# Patient Record
Sex: Male | Born: 1970
Health system: Southern US, Community
[De-identification: ages and names within clinical notes are randomized; demographics above are authoritative.]

## PROBLEM LIST (undated history)

## (undated) DIAGNOSIS — K5790 Diverticulosis of intestine, part unspecified, without perforation or abscess without bleeding: Secondary | ICD-10-CM

## (undated) DIAGNOSIS — I2510 Atherosclerotic heart disease of native coronary artery without angina pectoris: Secondary | ICD-10-CM

## (undated) DIAGNOSIS — F419 Anxiety disorder, unspecified: Secondary | ICD-10-CM

## (undated) DIAGNOSIS — M545 Low back pain, unspecified: Secondary | ICD-10-CM

## (undated) DIAGNOSIS — E785 Hyperlipidemia, unspecified: Secondary | ICD-10-CM

## (undated) DIAGNOSIS — R0789 Other chest pain: Secondary | ICD-10-CM

## (undated) DIAGNOSIS — K602 Anal fissure, unspecified: Secondary | ICD-10-CM

## (undated) DIAGNOSIS — T7840XA Allergy, unspecified, initial encounter: Secondary | ICD-10-CM

## (undated) DIAGNOSIS — T8859XA Other complications of anesthesia, initial encounter: Secondary | ICD-10-CM

## (undated) DIAGNOSIS — G5600 Carpal tunnel syndrome, unspecified upper limb: Secondary | ICD-10-CM

## (undated) DIAGNOSIS — J449 Chronic obstructive pulmonary disease, unspecified: Secondary | ICD-10-CM

## (undated) DIAGNOSIS — E559 Vitamin D deficiency, unspecified: Secondary | ICD-10-CM

## (undated) DIAGNOSIS — K449 Diaphragmatic hernia without obstruction or gangrene: Secondary | ICD-10-CM

## (undated) DIAGNOSIS — S0300XA Dislocation of jaw, unspecified side, initial encounter: Secondary | ICD-10-CM

## (undated) DIAGNOSIS — H9191 Unspecified hearing loss, right ear: Secondary | ICD-10-CM

## (undated) DIAGNOSIS — K219 Gastro-esophageal reflux disease without esophagitis: Secondary | ICD-10-CM

## (undated) DIAGNOSIS — E538 Deficiency of other specified B group vitamins: Secondary | ICD-10-CM

## (undated) DIAGNOSIS — A048 Other specified bacterial intestinal infections: Secondary | ICD-10-CM

## (undated) HISTORY — DX: Allergy, unspecified, initial encounter: T78.40XA

## (undated) HISTORY — DX: Anxiety disorder, unspecified: F41.9

## (undated) HISTORY — PX: VASECTOMY: SHX75

## (undated) HISTORY — DX: Diverticulosis of intestine, part unspecified, without perforation or abscess without bleeding: K57.90

## (undated) HISTORY — PX: STAPEDECTOMY: SHX2435

## (undated) HISTORY — DX: Low back pain, unspecified: M54.50

## (undated) HISTORY — PX: COLONOSCOPY: SHX174

## (undated) HISTORY — DX: Other specified bacterial intestinal infections: A04.8

## (undated) HISTORY — DX: Diaphragmatic hernia without obstruction or gangrene: K44.9

## (undated) HISTORY — DX: Dislocation of jaw, unspecified side, initial encounter: S03.00XA

## (undated) HISTORY — DX: Anal fissure, unspecified: K60.2

## (undated) HISTORY — DX: Hyperlipidemia, unspecified: E78.5

## (undated) HISTORY — PX: UPPER GI ENDOSCOPY: SHX6162

## (undated) HISTORY — PX: HERNIA REPAIR: SHX51

## (undated) HISTORY — DX: Chronic obstructive pulmonary disease, unspecified: J44.9

## (undated) HISTORY — DX: Low back pain: M54.5

## (undated) HISTORY — DX: Gastro-esophageal reflux disease without esophagitis: K21.9

---

## 2003-02-19 ENCOUNTER — Encounter: Admission: RE | Admit: 2003-02-19 | Discharge: 2003-02-19 | Payer: Self-pay | Admitting: Internal Medicine

## 2003-03-03 ENCOUNTER — Emergency Department (HOSPITAL_COMMUNITY): Admission: EM | Admit: 2003-03-03 | Discharge: 2003-03-03 | Payer: Self-pay | Admitting: Emergency Medicine

## 2004-02-15 ENCOUNTER — Ambulatory Visit: Payer: Self-pay | Admitting: Internal Medicine

## 2004-03-26 ENCOUNTER — Emergency Department (HOSPITAL_COMMUNITY): Admission: EM | Admit: 2004-03-26 | Discharge: 2004-03-26 | Payer: Self-pay | Admitting: Emergency Medicine

## 2004-03-27 ENCOUNTER — Ambulatory Visit: Payer: Self-pay | Admitting: Internal Medicine

## 2004-04-02 ENCOUNTER — Ambulatory Visit: Payer: Self-pay

## 2004-08-01 ENCOUNTER — Ambulatory Visit: Payer: Self-pay | Admitting: Internal Medicine

## 2004-08-25 ENCOUNTER — Ambulatory Visit: Payer: Self-pay | Admitting: Internal Medicine

## 2004-09-19 ENCOUNTER — Ambulatory Visit: Payer: Self-pay | Admitting: Internal Medicine

## 2004-09-29 ENCOUNTER — Ambulatory Visit: Payer: Self-pay | Admitting: Internal Medicine

## 2004-10-08 ENCOUNTER — Ambulatory Visit: Payer: Self-pay | Admitting: Internal Medicine

## 2004-10-10 ENCOUNTER — Encounter: Payer: Self-pay | Admitting: Internal Medicine

## 2004-10-10 ENCOUNTER — Ambulatory Visit: Payer: Self-pay | Admitting: Internal Medicine

## 2004-10-14 ENCOUNTER — Ambulatory Visit: Payer: Self-pay | Admitting: Internal Medicine

## 2004-11-07 ENCOUNTER — Ambulatory Visit: Payer: Self-pay | Admitting: Internal Medicine

## 2005-07-08 ENCOUNTER — Ambulatory Visit: Payer: Self-pay | Admitting: Internal Medicine

## 2006-03-02 ENCOUNTER — Ambulatory Visit: Payer: Self-pay | Admitting: Internal Medicine

## 2006-03-12 ENCOUNTER — Ambulatory Visit: Payer: Self-pay | Admitting: Internal Medicine

## 2006-03-16 ENCOUNTER — Ambulatory Visit: Payer: Self-pay | Admitting: Internal Medicine

## 2006-03-22 ENCOUNTER — Ambulatory Visit: Payer: Self-pay | Admitting: Internal Medicine

## 2006-04-14 ENCOUNTER — Ambulatory Visit: Payer: Self-pay | Admitting: Endocrinology

## 2006-04-14 LAB — CONVERTED CEMR LAB
Alkaline Phosphatase: 54 units/L (ref 39–117)
Amylase: 52 units/L (ref 27–131)
BUN: 7 mg/dL (ref 6–23)
Bacteria, UA: NEGATIVE
Basophils Absolute: 0 10*3/uL (ref 0.0–0.1)
Bilirubin, Direct: 0.2 mg/dL (ref 0.0–0.3)
CO2: 30 meq/L (ref 19–32)
Calcium: 9.3 mg/dL (ref 8.4–10.5)
Cholesterol: 155 mg/dL (ref 0–200)
Creatinine, Ser: 0.7 mg/dL (ref 0.4–1.5)
Eosinophils Absolute: 0.1 10*3/uL (ref 0.0–0.6)
Eosinophils Relative: 3.2 % (ref 0.0–5.0)
GFR calc Af Amer: 165 mL/min
Glucose, Bld: 104 mg/dL — ABNORMAL HIGH (ref 70–99)
HDL: 49.7 mg/dL (ref >39.0)
Ketones, ur: NEGATIVE mg/dL
Leukocytes, UA: NEGATIVE
Lymphocytes Relative: 31.7 % (ref 12.0–46.0)
MCHC: 34.2 g/dL (ref 30.0–36.0)
MCV: 91.5 fL (ref 78.0–100.0)
Monocytes Relative: 9.1 % (ref 3.0–11.0)
Neutro Abs: 2.6 10*3/uL (ref 1.4–7.7)
Neutrophils Relative %: 55 % (ref 43.0–77.0)
Platelets: 257 10*3/uL (ref 150–400)
Potassium: 4.7 meq/L (ref 3.5–5.1)
RBC / HPF: NONE SEEN
TSH: 1.27 microintl units/mL (ref 0.35–5.50)
Total CHOL/HDL Ratio: 3.1
Total Protein: 7 g/dL (ref 6.0–8.3)
Triglycerides: 51 mg/dL (ref 0–149)
Urine Glucose: NEGATIVE mg/dL
Urobilinogen, UA: 0.2 (ref 0.0–1.0)
VLDL: 10 mg/dL (ref 0–40)

## 2006-05-12 ENCOUNTER — Ambulatory Visit: Payer: Self-pay | Admitting: Internal Medicine

## 2006-05-21 ENCOUNTER — Ambulatory Visit: Payer: Self-pay | Admitting: Internal Medicine

## 2006-10-06 DIAGNOSIS — M545 Low back pain, unspecified: Secondary | ICD-10-CM | POA: Insufficient documentation

## 2006-10-06 DIAGNOSIS — K219 Gastro-esophageal reflux disease without esophagitis: Secondary | ICD-10-CM | POA: Insufficient documentation

## 2006-10-06 DIAGNOSIS — J309 Allergic rhinitis, unspecified: Secondary | ICD-10-CM | POA: Insufficient documentation

## 2006-10-06 DIAGNOSIS — J45909 Unspecified asthma, uncomplicated: Secondary | ICD-10-CM | POA: Insufficient documentation

## 2006-10-06 DIAGNOSIS — F411 Generalized anxiety disorder: Secondary | ICD-10-CM | POA: Insufficient documentation

## 2006-10-06 DIAGNOSIS — H9190 Unspecified hearing loss, unspecified ear: Secondary | ICD-10-CM | POA: Insufficient documentation

## 2007-01-08 ENCOUNTER — Encounter: Payer: Self-pay | Admitting: Internal Medicine

## 2007-01-20 ENCOUNTER — Encounter: Payer: Self-pay | Admitting: Internal Medicine

## 2008-03-22 ENCOUNTER — Ambulatory Visit: Payer: Self-pay | Admitting: Internal Medicine

## 2008-03-22 DIAGNOSIS — R1084 Generalized abdominal pain: Secondary | ICD-10-CM | POA: Insufficient documentation

## 2008-03-22 LAB — CONVERTED CEMR LAB
AST: 23 units/L (ref 0–37)
Albumin: 4.5 g/dL (ref 3.5–5.2)
Alkaline Phosphatase: 69 units/L (ref 39–117)
Amylase: 58 units/L (ref 27–131)
BUN: 11 mg/dL (ref 6–23)
Basophils Absolute: 0 10*3/uL (ref 0.0–0.1)
Basophils Relative: 0.7 % (ref 0.0–3.0)
Chloride: 105 meq/L (ref 96–112)
Cholesterol: 185 mg/dL (ref 0–200)
Creatinine, Ser: 0.8 mg/dL (ref 0.4–1.5)
Eosinophils Absolute: 0.1 10*3/uL (ref 0.0–0.7)
Eosinophils Relative: 2.4 % (ref 0.0–5.0)
GFR calc non Af Amer: 116 mL/min
Glucose, Bld: 98 mg/dL (ref 70–99)
HCT: 43.6 % (ref 39.0–52.0)
Hemoglobin: 15.1 g/dL (ref 13.0–17.0)
MCHC: 34.7 g/dL (ref 30.0–36.0)
MCV: 92.9 fL (ref 78.0–100.0)
Monocytes Absolute: 0.5 10*3/uL (ref 0.1–1.0)
Neutrophils Relative %: 49.7 % (ref 43.0–77.0)
Potassium: 3.8 meq/L (ref 3.5–5.1)
RBC: 4.69 M/uL (ref 4.22–5.81)
Specific Gravity, Urine: 1.015 (ref 1.000–1.03)
Total Protein, Urine: NEGATIVE mg/dL
Total Protein: 7.3 g/dL (ref 6.0–8.3)
Urine Glucose: NEGATIVE mg/dL
WBC: 5 10*3/uL (ref 4.5–10.5)
pH: 7 (ref 5.0–8.0)

## 2008-03-26 ENCOUNTER — Encounter: Admission: RE | Admit: 2008-03-26 | Discharge: 2008-03-26 | Payer: Self-pay | Admitting: Internal Medicine

## 2008-06-19 ENCOUNTER — Encounter: Payer: Self-pay | Admitting: Internal Medicine

## 2008-09-16 ENCOUNTER — Encounter: Payer: Self-pay | Admitting: Internal Medicine

## 2008-09-27 IMAGING — US US ABDOMEN COMPLETE
1 series · 14 of 25 positions shown · non-contrast
Comparison: none

ACCESSION NUMBER:  [id number]

 INDICATIONS:  Abdominal pain.
 READING PHYSICIAN:  Irania Buckner, M.D. 
 PROCEDURE:  Multiplanar abdominal ultrasound imaging was performed in the upright, supine, right and left lateral decubitus positions.
 RESULTS
 Abdominal aorta 22 mm.  The inferior vena cava is patent. 
 The pancreas appears normal throughout the head, body and tail without evidence of ductal dilatation, pancreatic masses, or peripancreatic inflammation.  
 Gallbladder is well distended, with normal wall thickness of 1.6 mm, with no pericholecystic fluid or intraluminal echogenic foci to suggest gallstone disease.  
 The common bile duct measures 7.1 mm in maximal diameter, upper limits of normal, without evidence of intraluminal foci.
 The liver appears normal without evidence of parenchymal lesion, ductal dilatation or vascular abnormality.
 Right kidney 112 mm, left kidney 108 mm.
 Spleen is normal in size, measuring 97 mm without parenchymal lesion.

[Series 1: us abdomen complete · 0.19mm/px · 14 of 56 slices shown]
[im 1/56]
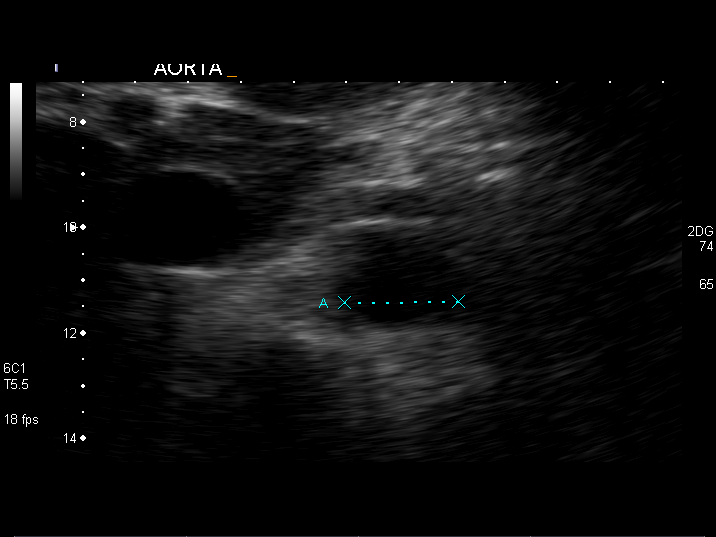
[im 5/56]
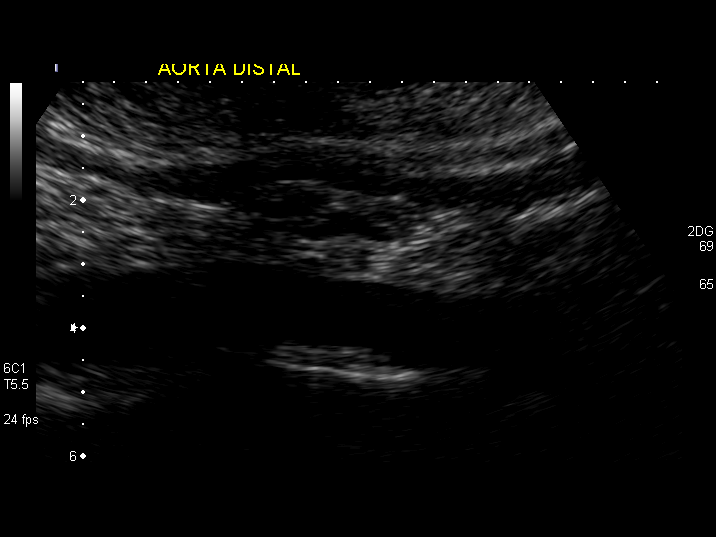
[im 10/56]
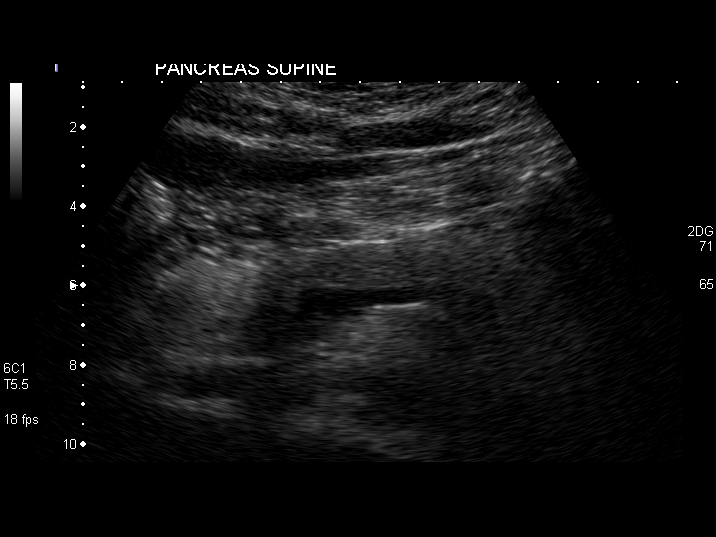
[im 14/56]
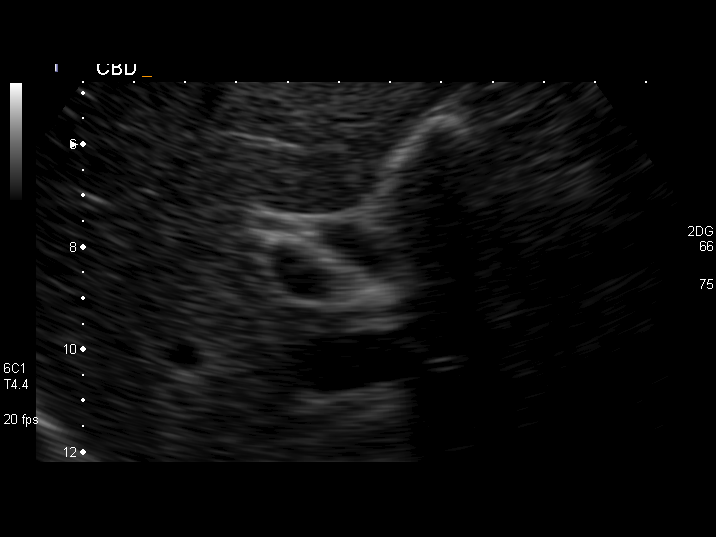
[im 19/56]
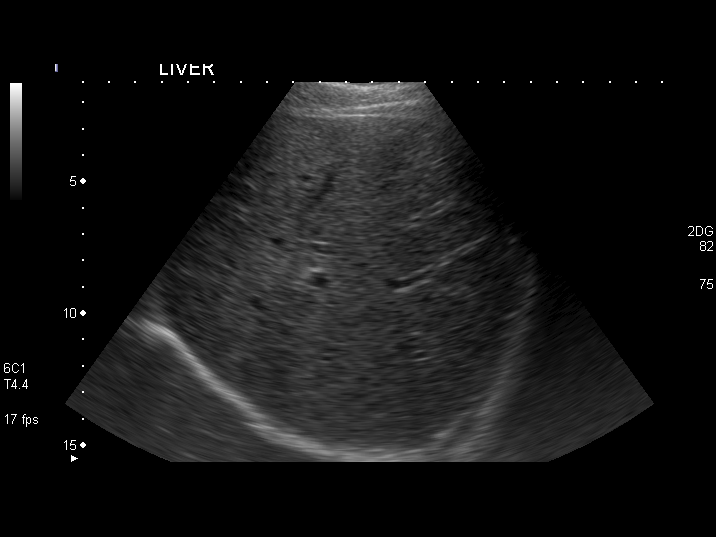
[im 21/56]
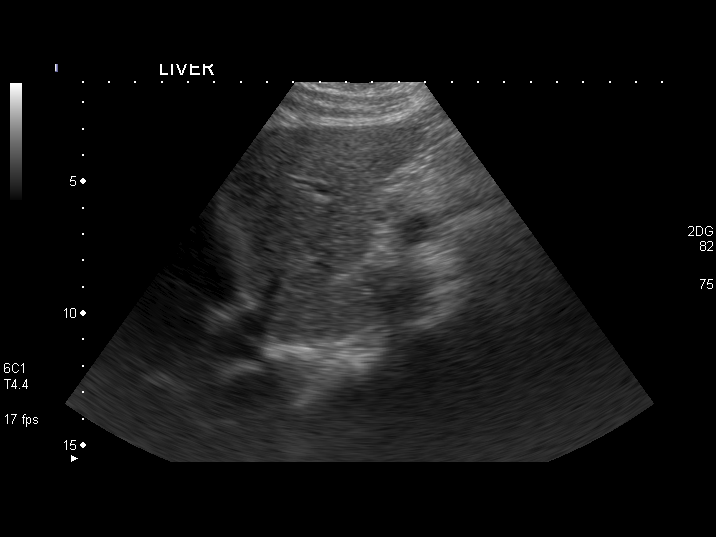
[im 26/56]
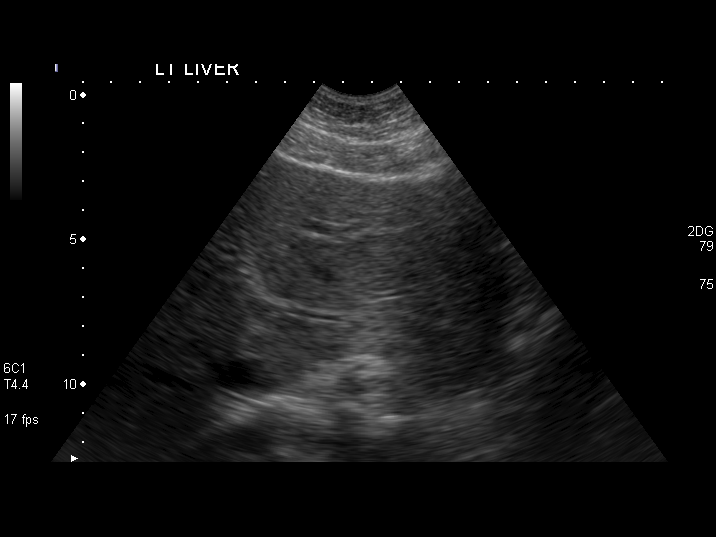
[im 30/56]
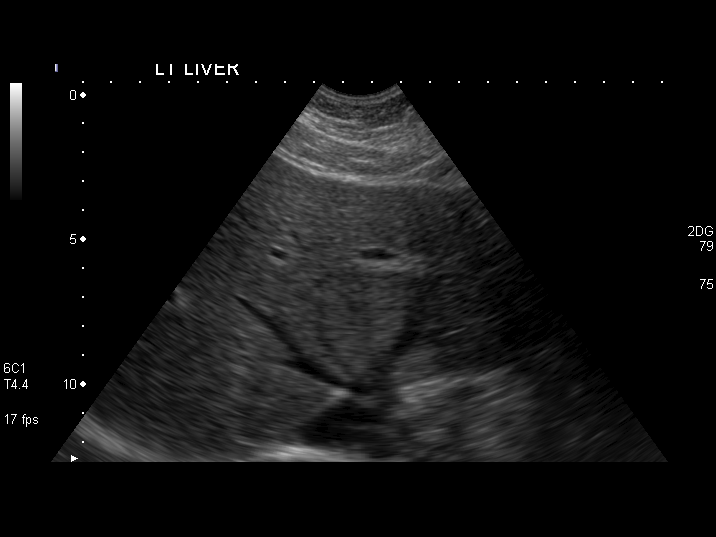
[im 35/56]
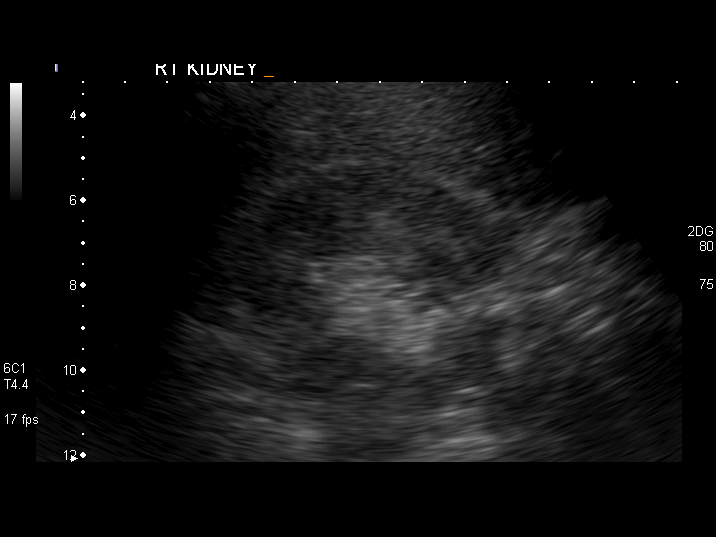
[im 37/56]
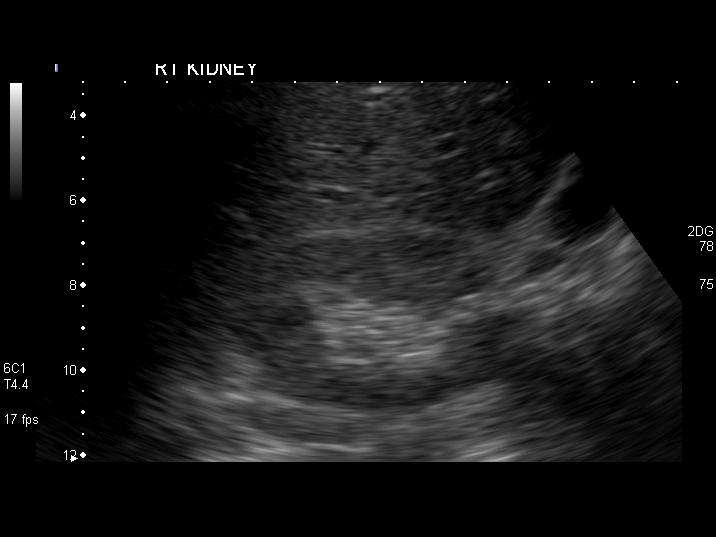
[im 42/56]
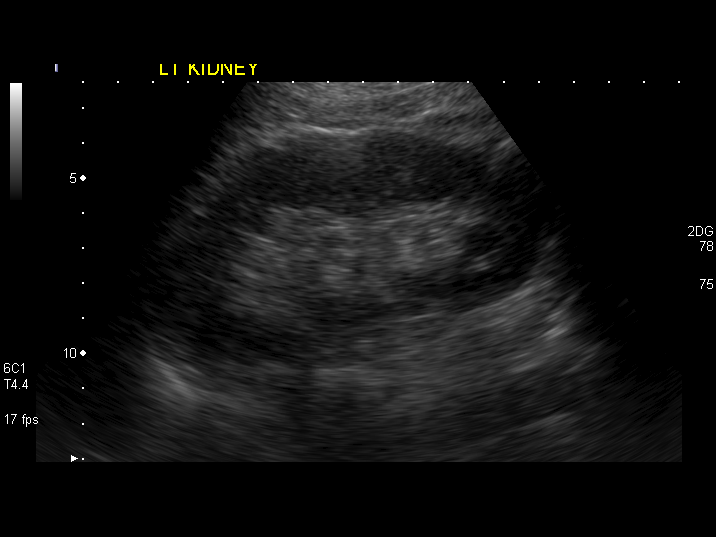
[im 46/56]
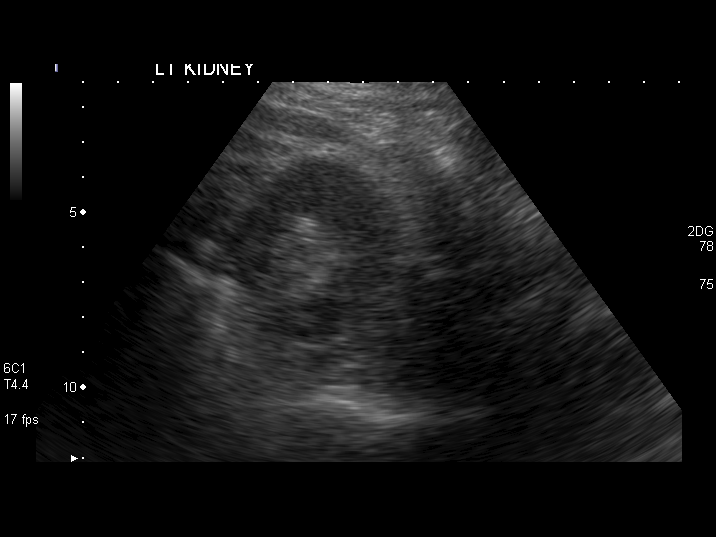
[im 51/56]
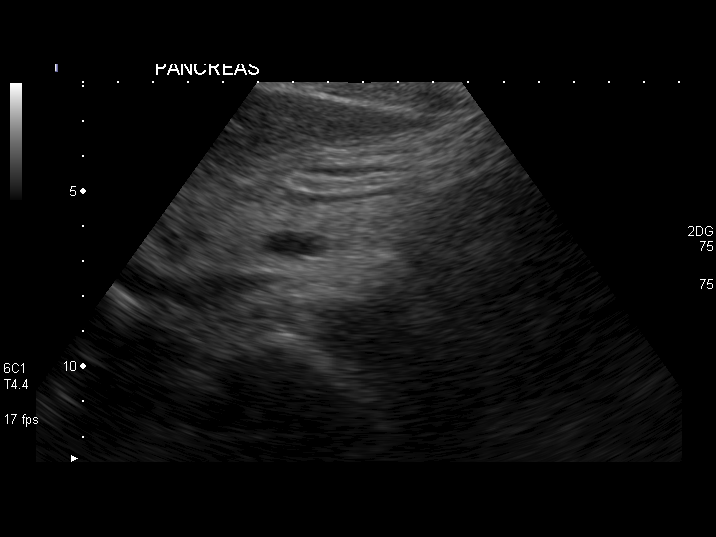
[im 56/56]
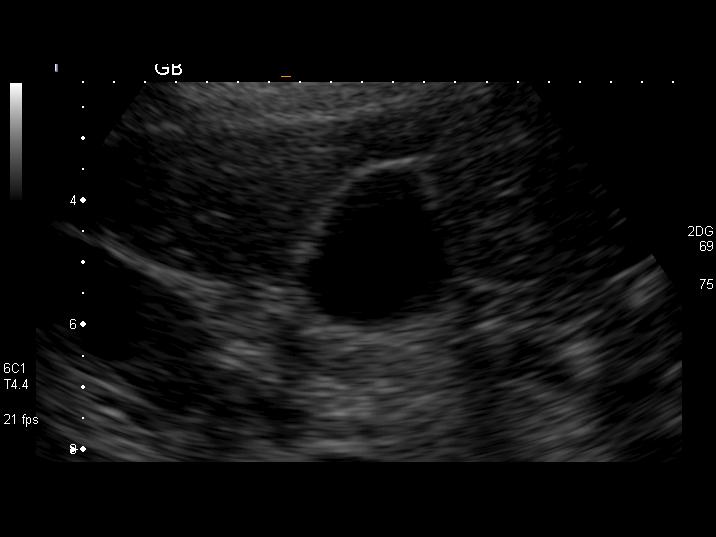

[14 of 25 positions shown; findings below may reference images not displayed]

IMPRESSION: Normal upper abdominal ultrasound of the gallbladder, pancreas, and liver with normal common bile duct at the upper limits of normal of 7.1 mm.

## 2008-12-25 ENCOUNTER — Encounter: Payer: Self-pay | Admitting: Internal Medicine

## 2009-04-18 ENCOUNTER — Ambulatory Visit: Payer: Self-pay | Admitting: Internal Medicine

## 2009-04-18 LAB — CONVERTED CEMR LAB
ALT: 62 units/L — ABNORMAL HIGH (ref 0–53)
AST: 27 units/L (ref 0–37)
Albumin: 4.3 g/dL (ref 3.5–5.2)
Alkaline Phosphatase: 70 units/L (ref 39–117)
BUN: 9 mg/dL (ref 6–23)
Basophils Absolute: 0 10*3/uL (ref 0.0–0.1)
Bilirubin Urine: NEGATIVE
Bilirubin, Direct: 0.2 mg/dL (ref 0.0–0.3)
Calcium: 9.3 mg/dL (ref 8.4–10.5)
Creatinine, Ser: 0.7 mg/dL (ref 0.4–1.5)
Eosinophils Absolute: 0.1 10*3/uL (ref 0.0–0.7)
Eosinophils Relative: 2.6 % (ref 0.0–5.0)
GFR calc non Af Amer: 133.62 mL/min (ref >60)
Glucose, Bld: 97 mg/dL (ref 70–99)
HDL: 63.9 mg/dL (ref >39.00)
Hemoglobin, Urine: NEGATIVE
Hemoglobin: 14 g/dL (ref 13.0–17.0)
Ketones, ur: NEGATIVE mg/dL
LDL Cholesterol: 116 mg/dL — ABNORMAL HIGH (ref 0–99)
Leukocytes, UA: NEGATIVE
Lymphocytes Relative: 35.1 % (ref 12.0–46.0)
Monocytes Relative: 8.7 % (ref 3.0–12.0)
Neutro Abs: 2.7 10*3/uL (ref 1.4–7.7)
Neutrophils Relative %: 52.7 % (ref 43.0–77.0)
Nitrite: NEGATIVE
Platelets: 211 10*3/uL (ref 150.0–400.0)
RDW: 11.6 % (ref 11.5–14.6)
Sodium: 143 meq/L (ref 135–145)
TSH: 1.12 microintl units/mL (ref 0.35–5.50)
Total Bilirubin: 0.9 mg/dL (ref 0.3–1.2)
Total Protein, Urine: NEGATIVE mg/dL
VLDL: 20.4 mg/dL (ref 0.0–40.0)

## 2009-09-25 ENCOUNTER — Encounter: Payer: Self-pay | Admitting: Internal Medicine

## 2009-09-25 ENCOUNTER — Ambulatory Visit: Payer: Self-pay | Admitting: Internal Medicine

## 2009-09-25 DIAGNOSIS — E785 Hyperlipidemia, unspecified: Secondary | ICD-10-CM | POA: Insufficient documentation

## 2009-09-25 DIAGNOSIS — K921 Melena: Secondary | ICD-10-CM | POA: Insufficient documentation

## 2009-09-25 DIAGNOSIS — K602 Anal fissure, unspecified: Secondary | ICD-10-CM | POA: Insufficient documentation

## 2009-09-30 ENCOUNTER — Ambulatory Visit: Payer: Self-pay | Admitting: Internal Medicine

## 2009-09-30 LAB — CONVERTED CEMR LAB
Bilirubin Urine: NEGATIVE
Ketones, ur: NEGATIVE mg/dL
Leukocytes, UA: NEGATIVE
Nitrite: NEGATIVE
Specific Gravity, Urine: 1.015 (ref 1.000–1.030)
Urine Glucose: NEGATIVE mg/dL
pH: 8 (ref 5.0–8.0)

## 2009-10-01 ENCOUNTER — Encounter: Payer: Self-pay | Admitting: Internal Medicine

## 2009-10-15 ENCOUNTER — Telehealth: Payer: Self-pay | Admitting: Internal Medicine

## 2009-10-16 ENCOUNTER — Encounter: Admission: RE | Admit: 2009-10-16 | Discharge: 2009-10-16 | Payer: Self-pay | Admitting: Otolaryngology

## 2009-10-19 ENCOUNTER — Emergency Department (HOSPITAL_COMMUNITY): Admission: EM | Admit: 2009-10-19 | Discharge: 2009-10-19 | Payer: Self-pay | Admitting: Emergency Medicine

## 2009-10-21 ENCOUNTER — Ambulatory Visit: Payer: Self-pay | Admitting: Internal Medicine

## 2009-10-21 DIAGNOSIS — R1013 Epigastric pain: Secondary | ICD-10-CM | POA: Insufficient documentation

## 2009-10-21 LAB — CONVERTED CEMR LAB
Albumin: 4.1 g/dL (ref 3.5–5.2)
Alkaline Phosphatase: 62 units/L (ref 39–117)
Basophils Absolute: 0 10*3/uL (ref 0.0–0.1)
Basophils Relative: 0.7 % (ref 0.0–3.0)
Bilirubin Urine: NEGATIVE
CO2: 30 meq/L (ref 19–32)
Calcium: 9.2 mg/dL (ref 8.4–10.5)
Chloride: 105 meq/L (ref 96–112)
Eosinophils Absolute: 0.1 10*3/uL (ref 0.0–0.7)
GFR calc non Af Amer: 109.48 mL/min (ref >60)
Glucose, Bld: 90 mg/dL (ref 70–99)
HCT: 41.3 % (ref 39.0–52.0)
Hemoglobin, Urine: NEGATIVE
Hemoglobin: 14.6 g/dL (ref 13.0–17.0)
Lipase: 26 units/L (ref 11.0–59.0)
Lymphocytes Relative: 35.6 % (ref 12.0–46.0)
Lymphs Abs: 1.8 10*3/uL (ref 0.7–4.0)
MCHC: 35.5 g/dL (ref 30.0–36.0)
MCV: 92.3 fL (ref 78.0–100.0)
Monocytes Absolute: 0.6 10*3/uL (ref 0.1–1.0)
Neutro Abs: 2.6 10*3/uL (ref 1.4–7.7)
Nitrite: NEGATIVE
RBC: 4.47 M/uL (ref 4.22–5.81)
RDW: 12.8 % (ref 11.5–14.6)
Sed Rate: 10 mm/hr (ref 0–22)
Sodium: 141 meq/L (ref 135–145)
Total Protein, Urine: NEGATIVE mg/dL
Total Protein: 6.6 g/dL (ref 6.0–8.3)
Urobilinogen, UA: 0.2 (ref 0.0–1.0)
pH: 6 (ref 5.0–8.0)

## 2009-10-22 ENCOUNTER — Ambulatory Visit: Payer: Self-pay | Admitting: Cardiovascular Disease

## 2009-10-24 ENCOUNTER — Telehealth: Payer: Self-pay | Admitting: Internal Medicine

## 2009-11-01 ENCOUNTER — Encounter: Payer: Self-pay | Admitting: Internal Medicine

## 2009-11-12 ENCOUNTER — Ambulatory Visit: Payer: Self-pay | Admitting: Internal Medicine

## 2009-11-12 DIAGNOSIS — R933 Abnormal findings on diagnostic imaging of other parts of digestive tract: Secondary | ICD-10-CM | POA: Insufficient documentation

## 2009-11-12 DIAGNOSIS — R198 Other specified symptoms and signs involving the digestive system and abdomen: Secondary | ICD-10-CM | POA: Insufficient documentation

## 2009-11-26 ENCOUNTER — Ambulatory Visit: Payer: Self-pay | Admitting: Internal Medicine

## 2009-11-26 DIAGNOSIS — R059 Cough, unspecified: Secondary | ICD-10-CM | POA: Insufficient documentation

## 2009-11-26 DIAGNOSIS — R05 Cough: Secondary | ICD-10-CM

## 2009-11-26 DIAGNOSIS — J029 Acute pharyngitis, unspecified: Secondary | ICD-10-CM | POA: Insufficient documentation

## 2009-12-06 ENCOUNTER — Encounter: Payer: Self-pay | Admitting: Internal Medicine

## 2009-12-17 ENCOUNTER — Ambulatory Visit: Payer: Self-pay | Admitting: Internal Medicine

## 2010-01-27 ENCOUNTER — Ambulatory Visit: Payer: Self-pay | Admitting: Internal Medicine

## 2010-01-27 DIAGNOSIS — J019 Acute sinusitis, unspecified: Secondary | ICD-10-CM | POA: Insufficient documentation

## 2010-01-29 ENCOUNTER — Encounter: Payer: Self-pay | Admitting: Internal Medicine

## 2010-02-11 ENCOUNTER — Ambulatory Visit
Admission: RE | Admit: 2010-02-11 | Discharge: 2010-02-11 | Payer: Self-pay | Source: Home / Self Care | Attending: Internal Medicine | Admitting: Internal Medicine

## 2010-02-11 DIAGNOSIS — H6990 Unspecified Eustachian tube disorder, unspecified ear: Secondary | ICD-10-CM | POA: Insufficient documentation

## 2010-02-11 DIAGNOSIS — H6690 Otitis media, unspecified, unspecified ear: Secondary | ICD-10-CM | POA: Insufficient documentation

## 2010-02-18 ENCOUNTER — Telehealth: Payer: Self-pay | Admitting: Internal Medicine

## 2010-03-11 NOTE — Procedures (Signed)
Summary: Upper Endoscopy  Patient: Billy Morris Note: All result statuses are Final unless otherwise noted.  Tests: (1) Upper Endoscopy (EGD)   EGD Upper Endoscopy       DONE     [facility]     [address]     [place], [place]  [address]           ENDOSCOPY PROCEDURE REPORT           PATIENT:  Billy Morris, Billy Morris  MR#:  [id number]     BIRTHDATE:  1970/06/16, 39 yrs. old  GENDER:  male           ENDOSCOPIST:  Wilhemina Bonito. Eda Keys, MD     Referred by:  Office           PROCEDURE DATE:  12/17/2009     PROCEDURE:  EGD, diagnostic 19147     ASA CLASS:  Class II     INDICATIONS:  abdominal pain           MEDICATIONS:   There was residual sedation effect present from     prior procedure., Fentanyl 25 mcg IV, Versed 2 mg IV     TOPICAL ANESTHETIC:  Exactacain Spray           DESCRIPTION OF PROCEDURE:   After the risks benefits and     alternatives of the procedure were thoroughly explained, informed     consent was obtained.  The LB GIF-H180 [id number] endoscope was     introduced through the mouth and advanced to the second portion of     the duodenum, without limitations.  The instrument was slowly     withdrawn as the mucosa was fully examined.     <<PROCEDUREIMAGES>>           The upper, middle, and distal third of the esophagus were     carefully inspected and no abnormalities were noted. The z-line     was well seen at the GEJ. The endoscope was pushed into the fundus     which was normal including a retroflexed view. The antrum,gastric     body, first and second part of the duodenum were unremarkable.     Retroflexed views revealed no abnormalities.    The scope was then     withdrawn from the patient and the procedure completed.           COMPLICATIONS:  None           ENDOSCOPIC IMPRESSION:     1) Normal EGD     2) Nonulcer dyspepsia     RECOMMENDATIONS:     1) follow-up prn (feeling better now)           ______________________________     Wilhemina Bonito. Eda Keys, MD          CC:  Corwin Levins, MD, The Patient           n.     eSIGNEDWilhemina Bonito. Eda Keys at 12/17/2009 03:02 PM           Vernell Leep, [id number]  Note: An exclamation mark (!) indicates a result that was not dispersed into the flowsheet. Document Creation Date: 12/17/2009 3:02 PM _______________________________________________________________________  (1) Order result status: Final Collection or observation date-time: 12/17/2009 14:55 Requested date-time:  Receipt date-time:  Reported date-time:  Referring Physician:   Ordering Physician: Fransico Setters [phone]) Specimen Source:  Source: Launa Grill Order Number: [id number] Lab site:

## 2010-03-11 NOTE — Assessment & Plan Note (Signed)
Summary: STOMACH PROBLEMS/ LOWER ABD /NWS   Vital Signs:  Patient profile:   40 year old male Height:      66 inches Weight:      184.25 pounds BMI:     29.85 O2 Sat:      98 % on Room air Temp:     98.2 degrees F oral Pulse rate:   69 / minute BP sitting:   118 / 74  (left arm) Cuff size:   regular  Vitals Entered By: Zella Ball Ewing CMA (AAMA) (September 30, 2009 1:50 PM)  O2 Flow:  Room air CC: Stomach problems/RE   CC:  Stomach problems/RE.  History of Present Illness: here to f/u - had routine regular diet, but over the weekend in [place] felt more constipated, similar to one mo agoa swell;  felt some "rumbling and moving" in the upper mid abd area;  BM's over the weekend quite small but not very hard - just small amounts.  Last normal BM per pt aobut 2 am today, but "just didnt feel right."  No blood except for small volume iwth wiping the anal irritaiton area, no n/v or other significnat worse pain (has mild ongoin irriation) .  Has been uring the anusol [facility] but just not today.  No No fever, wt loss, night sweats, loss of appetite or other constitutional symptoms  Has appt with Dr Marina Goodell oct 4.   Also menitoned has seen chiropracter for recurrent pain to lower back, and told he has muscle spasm;  but he remembers a "black spot" on the xray that really concerned him and still does to this day.  Pt denies CP, worsening sob, doe, wheezing, orthopnea, pnd, worsening LE edema, palps, dizziness or syncope  Pt denies new neuro symptoms such as headache, facial or extremity weakness  Ongoing anxiety persists but no pani, or worsening depressive symtpoms.  Has full time job, son got injuryed over the weekend (not serious) , and several other small projects that do pay money, as well as one for [employer].  Anxiety really started iwth the anal tear approx 6 wks , also assoc with change in lifestyle wiht work - was working 5 days second shift;  now working full time 36 hrs on the weekends nights only.     Preventive Screening-Counseling & Management      Drug Use:  no.    Problems Prior to Update: 1)  Anal Fissure  (ICD-565.0) 2)  Hematochezia  (ICD-578.1) 3)  Hyperlipidemia  (ICD-272.4) 4)  Abdominal Pain, Generalized  (ICD-789.07) 5)  Preventive Health Care  (ICD-V70.0) 6)  Low Back Pain  (ICD-724.2) 7)  Gerd  (ICD-530.81) 8)  Anxiety  (ICD-300.00) 9)  Loss, Hearing Nos  (ICD-389.9) 10)  Asthma  (ICD-493.90) 11)  Allergic Rhinitis  (ICD-477.9)  Medications Prior to Update: 1)  Anusol-Hc 25 Mg Supp (Hydrocortisone Acetate) .Marland Kitchen.. 1 Pr Two Times A Day For 7 Days  Current Medications (verified): 1)  Anusol-Hc 25 Mg Supp (Hydrocortisone Acetate) .Marland Kitchen.. 1 Pr Two Times A Day For 7 Days 2)  Proctofoam Hc 1-1 % Foam (Hydrocortisone Ace-Pramoxine) .... Use Asd Bid As Needed 3)  Prednisone 10 Mg Tabs (Prednisone) .... 3po Qd For 3days, Then 2po Qd For 3days, Then 1po Qd For 3days, Then Stop  Allergies (verified): 1)  ! Asa 2)  ! Cipro 3)  ! 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   Head:  normocephalic and atraumatic.   Eyes:  vision grossly intact, pupils equal, and pupils round.   Ears:  R ear normal and L ear normal.   Nose:  no external deformity and no nasal discharge.    Mouth:  no gingival abnormalities and pharynx pink and moist.   Neck:  supple and no masses.   Lungs:  normal respiratory effort and normal breath sounds.   Heart:  normal rate and regular rhythm.   Abdomen:  soft, non-tender, and normal bowel sounds.  except or mild LLQ tender at best,  no guarding or rebound Extremities:  no edema, no erythema    Impression & Recommendations:  Problem # 1:  ABDOMINAL PAIN, GENERALIZED (ICD-789.07)  primarily upper; as well as LLQ with tender there today;  diff includes UTI, constipation/IBS? , or IBD ;  for UA today,, and trial mag citrate - 1 bottle OTC;  if not improved can try limited low dose prednisone course as empriic trial;  very much doubt infectious etiology such as diverticulitis  Orders: T-Culture, Urine (04540-98119) TLB-Udip w/ Micro (81001-URINE)  Problem # 2:  HEMATOCHEZIA (ICD-578.1) ok also for topical proctofoam HC  Problem # 3:  ANXIETY (ICD-300.00) stable overall by hx and exam, ok to continue meds/tx as is  - declines meds at this time  Problem # 4:  GERD (ICD-530.81) asympt per pt - no meds needed at this time  Complete Medication List: 1)  Anusol-hc 25 Mg Supp (Hydrocortisone acetate) .Marland Kitchen.. 1 pr two times a day for 7 days 2)  Proctofoam Hc 1-1 % Foam (Hydrocortisone ace-pramoxine) .... Use asd bid as needed 3)  Prednisone 10 Mg Tabs (Prednisone) .... 3po qd for 3days, then 2po qd for 3days, then 1po qd for 3days, then stop  Patient Instructions: 1)  please try 1 bottle Mag Citrate OTC today 2)  Please take all new medications as prescribed - the topical foam for the anal region, as well as the prednisone if the mag citrate does not seem to help 3)  Please go to the Lab in the basement for your blood and/or urine tests today  4)  Please keep your appt with Dr Marina Goodell as planned 5)  Please schedule a follow-up appointment as March 2012 with CPX labs, or sooner if needed Prescriptions: PREDNISONE 10 MG TABS (PREDNISONE)  3po qd for 3days, then 2po qd for 3days, then 1po qd for 3days, then stop  #18 x 0   Entered and Authorized by:   Corwin Levins MD   Signed by:   Corwin Levins MD on 09/30/2009   Method used:   Print then Give to Patient   RxID:   [id number] PROCTOFOAM HC 1-1 % FOAM (HYDROCORTISONE ACE-PRAMOXINE) use asd bid as needed  #1 x 1   Entered and Authorized by:   Corwin Levins MD   Signed by:   Corwin Levins  MD on 09/30/2009   Method used:   Print then Give to Patient   RxID:   [id number]

## 2010-03-11 NOTE — Consult Note (Signed)
Summary[facility]   Imported By: Sherian Rein 11/13/2009 07:24:51  _____________________________________________________________________  External Attachment:    Type:   Image     Comment:   External Document

## 2010-03-11 NOTE — Letter (Signed)
Summary: [facility]  [facility]   Imported By: Sherian Rein 11/05/2009 14:43:48  _____________________________________________________________________  External Attachment:    Type:   Image     Comment:   External Document

## 2010-03-11 NOTE — Procedures (Signed)
Summary: Colonoscopy  Patient: Billy Morris Note: All result statuses are Final unless otherwise noted.  Tests: (1) Colonoscopy (COL)   COL Colonoscopy           DONE     [facility]     [address]     [place], [place]  [address]           COLONOSCOPY PROCEDURE REPORT           PATIENT:  Billy Morris, Billy Morris  MR#:  [id number]     BIRTHDATE:  04/20/1970, 39 yrs. old  GENDER:  male     ENDOSCOPIST:  Wilhemina Bonito. Eda Keys, MD     REF. BY:  Office     PROCEDURE DATE:  12/17/2009     PROCEDURE:  Diagnostic Colonoscopy     ASA CLASS:  Class II     INDICATIONS:  Abdominal pain, Abnormal CT of abdomen (mild diffuse     colonic wall thickening), change in bowel habits     MEDICATIONS:   Fentanyl 75 mcg IV, Versed 8 mg IV           DESCRIPTION OF PROCEDURE:   After the risks benefits and     alternatives of the procedure were thoroughly explained, informed     consent was obtained.  Digital rectal exam was performed and     revealed no abnormalities.   The LB 180AL [id number] endoscope was     introduced through the anus and advanced to the cecum, which was     identified by both the appendix and ileocecal valve, without     limitations.Time to cecum = 1:27 min.  The quality of the prep was     excellent, using MoviPrep.  The instrument was then slowly     withdrawn (time = 7:27 min) as the colon was fully examined.     <<PROCEDUREIMAGES>>           FINDINGS:  The terminal ileum appeared normal.  A few scattered     diverticula were found.  Otherwise, a normal appearing cecum,     ileocecal valve, and appendiceal orifice were identified. The     ascending, hepatic flexure, transverse, splenic flexure,     descending, sigmoid colon, and rectum appeared unremarkable.     Retroflexed views in the rectum revealed no abnormalities.    The     scope was then withdrawn from the patient and the procedure     completed.           COMPLICATIONS:  None     ENDOSCOPIC IMPRESSION:     1) Normal  terminal ileum     2) Diverticula, a few scattered     3) Normal colon     RECOMMENDATIONS:     1) Continue current colorectal screening recommendations for     "routine risk" patients with a repeat colonoscopy at age 38.Marland Kitchen     2) Upper endoscopy today           ______________________________     Wilhemina Bonito. Eda Keys, MD           CC:  Corwin Levins, MD;The Patient           n.     Rosalie DoctorWilhemina Bonito. Eda Keys at 12/17/2009 02:52 PM           Vernell Leep, [id number]  Note: An exclamation mark (!) indicates a result that was not dispersed into  the flowsheet. Document Creation Date: 12/17/2009 2:52 PM _______________________________________________________________________  (1) Order result status: Final Collection or observation date-time: 12/17/2009 14:43 Requested date-time:  Receipt date-time:  Reported date-time:  Referring Physician:   Ordering Physician: Fransico Setters [phone]) Specimen Source:  Source: Launa Grill Order Number: [id number] Lab site:   Appended Document: Colonoscopy    Clinical Lists Changes  Observations: Added new observation of COLONNXTDUE: 08/2020 (12/17/2009 15:31)

## 2010-03-11 NOTE — Letter (Signed)
Summary: New Patient letter  [facility]  [address] [place], [place] [address]   Phone: [phone]  Fax: [phone]       09/25/2009 MRN: [id number]  Zein [place] [address] [place], [place]  [address]  Dear Mr. Marchia Bond,  Welcome to the Gastroenterology Division at [facility].    You are scheduled to see Dr.  Marina Goodell on 11-12-09 at 9:30am on the 3rd floor at [facility], [address]  We ask that you try to arrive at our office 15 minutes prior to your appointment time to allow for check-in.  We would like you to complete the enclosed self-administered evaluation form prior to your visit and bring it with you on the day of your appointment.  We will review it with you.  Also, please bring a complete list of all your medications or, if you prefer, bring the medication bottles and we will list them.  Please bring your insurance card so that we may make a copy of it.  If your insurance requires a referral to see a specialist, please bring your referral form from your primary care physician.  Co-payments are due at the time of your visit and may be paid by cash, check or credit card.     Your office visit will consist of a consult with your physician (includes a physical exam), any laboratory testing he/she may order, scheduling of any necessary diagnostic testing (e.g. x-ray, ultrasound, CT-scan), and scheduling of a procedure (e.g. Endoscopy, Colonoscopy) if required.  Please allow enough time on your schedule to allow for any/all of these possibilities.    If you cannot keep your appointment, please call [phone] to cancel or reschedule prior to your appointment date.  This allows [place] the opportunity to schedule an appointment for another patient in need of care.  If you do not cancel or reschedule by 5 p.m. the business day prior to your appointment date, you will be charged a $50.00 late cancellation/no-show fee.    Thank you for choosing [facility] for your medical needs.  We appreciate the opportunity to care for you.  Please visit [place] at our website  to learn more about our practice.                     Sincerely,                                                             The Gastroenterology Division

## 2010-03-11 NOTE — Progress Notes (Signed)
Summary: Call Report  Phone Note Other Incoming   Caller: [facility] Summary of Call: [facility] Triage Call Report Triage Record Num: [id number] Operator: Vernell Barrier Patient Name: Billy Morris Call Date & Time: 10/12/2009 8:00:54AM Patient Phone: [phone] PCP: Oliver Barre Patient Gender: Male PCP Fax : [phone] Patient DOB: [birth year] Practice Name: [facility] Reason for Call: Pt Calling regarding ear pain, sore throat,fever. Onset "several days". T 97.7 (TM). Care advice given. Emergent sx r/o per Earache protocol. Pt to call office at 0900 to schedule appt. Protocol(s) Used: Ear: Symptoms Recommended Outcome per Protocol: See Provider within 24 hours Reason for Outcome: Constant or intermittent dull earache, throbbing in ear(s) or feeling of fullness; may interfere with sleep or ability to carry out normal activities Care Advice: A warm washcloth or heating pad set on low to the affected ear may help relieve the discomfort. May apply for 15 to 20 minutes, 3 to 4 times a day.  ~ Keep ear canal as dry as possible. Take a bath instead of showering. Try to keep water out of ear when washing hair by placing cotton balls in ear opening. Avoid swimming or water sports until [employer] by provider.  ~  ~ SYMPTOM / CONDITION MANAGEMENT 10/12/2009 8:11:24AM Page 1 of 1 CAN Initial call taken by: Margaret Pyle, CMA,  October 15, 2009 8:02 AM  Follow-up for Phone Call        noted Follow-up by: Corwin Levins MD,  October 15, 2009 12:53 PM

## 2010-03-11 NOTE — Assessment & Plan Note (Signed)
Summary: BM IRRITATION--STC   Vital Signs:  Patient profile:   40 year old male Height:      66 inches Weight:      182 pounds BMI:     29.48 O2 Sat:      98 % on Room air Temp:     98.1 degrees F oral Pulse rate:   67 / minute BP sitting:   110 / 76  (left arm) Cuff size:   regular  Vitals Entered By: Zella Ball Ewing CMA Duncan Dull) (September 25, 2009 9:40 AM)  O2 Flow:  Room air CC: Bowel Movement irritation/RE   CC:  Bowel Movement irritation/RE.  History of Present Illness: her to f/u - has hx of IBS but for 4 to 6 wks increased symtpoms of rectal irritation, and one episode of BRB  small amount on the tissue;  over the past 4 wks still with mild discomfort, and noted a small lump near the rectal area but no further blood;  2 days ago also with a repeat episode of small amount blood on tissue only;  pain occurs with tearing like sensation to the anal area with BM;  has never had the colonscopy.  No abd pain, n/v, or other blood .  No recent reflux or dysphagia, wt loss.  S/p H pylori tx x 2 .  No recent wt loss, infact has gained wt.  Pt denies CP, sob, doe, wheezing, orthopnea, pnd, worsening LE edema, palps, dizziness or syncope  Pt denies new neuro symptoms such as headache, facial or extremity weakness  No fever, wt loss, night sweats, loss of appetite or other constitutional symptoms  Has been less active recently as well, though till rides the stationary bike occasionally, b/c makes the constipation issue acutally worse.  Very busy at work, now working wekend nights - 3 dyas per wk, 12 hr shifts, at [employer]);  did walk in 5k last yr without difficulty to raise money for charity.  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  No other overt blreeding or bruising.     also with persistent chornic hearing loss, and has not been able to see the ENT as referred previously  Problems Prior to Update: 1)  Abdominal Pain,  Generalized  (ICD-789.07) 2)  Preventive Health Care  (ICD-V70.0) 3)  Low Back Pain  (ICD-724.2) 4)  Gerd  (ICD-530.81) 5)  Anxiety  (ICD-300.00) 6)  Loss, Hearing Nos  (ICD-389.9) 7)  Asthma  (ICD-493.90) 8)  Allergic Rhinitis  (ICD-477.9)  Medications Prior to Update: 1)  Hyoscyamine Sulfate 0.125 Mg Tabs (Hyoscyamine Sulfate) .Marland Kitchen.. 1 By Mouth Q 8 Hrs As Needed 2)  Omeprazole 20 Mg Cpdr (Omeprazole) .... 2 By Mouth Once Daily  Current Medications (verified): 1)  None  Allergies (verified): 1)  ! Asa 2)  ! Cipro 3)  ! 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   Head:  normocephalic and atraumatic.   Eyes:  vision grossly intact, pupils  equal, and pupils round.   Ears:  R ear normal and L ear normal.   Nose:  no external deformity and no nasal discharge.   Mouth:  no gingival abnormalities and pharynx pink and moist.   Neck:  supple and no masses.   Lungs:  normal respiratory effort and normal breath sounds.   Heart:  normal rate and regular rhythm.   Abdomen:  soft, non-tender, and normal bowel sounds.  except for mild epigastric tender Rectal:  normal tone, heme neg, small fissure note 6oclock ? source of bleeding Extremities:  no edema, no erythema  Skin:  no bruises Psych:  moderately anxious.     Impression & Recommendations:  Problem # 1:  ANAL FISSURE (ICD-565.0) ok for anusol HC  course,  consider gen surgury if not healing  Problem # 2:  HEMATOCHEZIA (ICD-578.1)  with  small volume recurent BRBPR;  likely due to above, but will refer to GI for further eval - ? need flex sig, or colonscopy with hx of IBS ;  also with hx of h pylori  Orders: Gastroenterology Referral (GI)  Problem # 3:  HYPERLIPIDEMIA (ICD-272.4)  Labs Reviewed: SGOT: 27 (04/18/2009)   SGPT: 62 (04/18/2009)   HDL:63.90 (04/18/2009), 58.6 (03/22/2008)  LDL:116 (04/18/2009), 116 (03/22/2008)  Chol:200 (04/18/2009), 185 (03/22/2008)  Trig:102.0 (04/18/2009), 50 (03/22/2008) d/w pt - to follow lower chol diet  Problem # 4:  LOSS, HEARING NOS (ICD-389.9)  for ENT referral  Orders: ENT Referral (ENT)  Complete Medication List: 1)  Anusol-hc 25 Mg Supp (Hydrocortisone acetate) .Marland Kitchen.. 1 pr two times a day for 7 days  Patient Instructions: 1)  Please take all new medications as prescribed 2)  Continue all previous medications as before this visit  3)  You will be contacted about the referral(s) to: GI and ENT 4)  Please try to follow lower cholesterol diet 5)  Please schedule a follow-up appointment in March 2012 with CPX labs Prescriptions: ANUSOL-HC 25 MG SUPP (HYDROCORTISONE ACETATE) 1 pr two times a day for 7 days  #14 x 1   Entered and Authorized by:   Corwin Levins MD   Signed by:   Corwin Levins MD on 09/25/2009   Method used:   Print then Give to Patient   RxID:   [id number]

## 2010-03-11 NOTE — Consult Note (Signed)
Summary: [facility]  [facility]   Imported By: Lester Shipman 12/11/2009 08:50:11  _____________________________________________________________________  External Attachment:    Type:   Image     Comment:   External Document

## 2010-03-11 NOTE — Letter (Signed)
Summary: [facility] Instructions  [facility]  [address] [place], [place] [address]   Phone: [phone]  Fax: [phone]       Billy Morris    1970/08/20    MRN: [id number]        Procedure Day /Date:TUESDAY, 12/17/09     Arrival Time:1:00 PM     Procedure Time:2l:00 PM     Location of Procedure:                    X  [facility] (4th Floor)     PREPARATION FOR COLONOSCOPY WITH MOVIPREP/ENDO   Starting 5 days prior to your procedure 12/12/09 do not eat nuts, seeds, popcorn, corn, beans, peas,  salads, or any raw vegetables.  Do not take any fiber supplements (e.g. Metamucil, Citrucel, and Benefiber).  THE DAY BEFORE YOUR PROCEDURE         DATE: 12/16/09  DAY: MONDAY  1.  Drink clear liquids the entire day-NO SOLID FOOD  2.  Do not drink anything colored red or purple.  Avoid juices with pulp.  No orange juice.  3.  Drink at least 64 oz. (8 glasses) of fluid/clear liquids during the day to prevent dehydration and help the prep work efficiently.  CLEAR LIQUIDS INCLUDE: Water Jello Ice Popsicles Tea (sugar ok, no milk/cream) Powdered fruit flavored drinks Coffee (sugar ok, no milk/cream) Gatorade Juice: apple, white grape, white cranberry  Lemonade Clear bullion, consomm, broth Carbonated beverages (any kind) Strained chicken noodle soup Hard Candy                             4.  In the morning, mix first dose of MoviPrep solution:    Empty 1 Pouch A and 1 Pouch B into the disposable container    Add lukewarm drinking water to the top line of the container. Mix to dissolve    Refrigerate (mixed solution should be used within 24 hrs)  5.  Begin drinking the prep at 5:00 p.m. The MoviPrep container is divided by 4 marks.   Every 15 minutes drink the solution down to the next mark (approximately 8 oz) until the full liter is complete.   6.  Follow completed prep with 16 oz of clear liquid of your choice (Nothing red or purple).   Continue to drink clear liquids until bedtime.  7.  Before going to bed, mix second dose of MoviPrep solution:    Empty 1 Pouch A and 1 Pouch B into the disposable container    Add lukewarm drinking water to the top line of the container. Mix to dissolve    Refrigerate  THE DAY OF YOUR PROCEDURE      DATE: 12/17/09 DAY: TUESDAY  Beginning at 9:00 a.m. (5 hours before procedure):         1. Every 15 minutes, drink the solution down to the next mark (approx 8 oz) until the full liter is complete.  2. Follow completed prep with 16 oz. of clear liquid of your choice.    3. You may drink clear liquids until 12 NOON (2 HOURS BEFORE PROCEDURE).   MEDICATION INSTRUCTIONS  Unless otherwise instructed, you should take regular prescription medications with a small sip of water   as early as possible the morning of your procedure.         OTHER INSTRUCTIONS  You will need a responsible adult at least 40 years  of age to accompany you and drive you home.   This person must remain in the waiting room during your procedure.  Wear loose fitting clothing that is easily removed.  Leave jewelry and other valuables at home.  However, you may wish to bring a book to read or  an iPod/MP3 player to listen to music as you wait for your procedure to start.  Remove all body piercing jewelry and leave at home.  Total time from sign-in until discharge is approximately 2-3 hours.  You should go home directly after your procedure and rest.  You can resume normal activities the  day after your procedure.  The day of your procedure you should not:   Drive   Make legal decisions   Operate machinery   Drink alcohol   Return to work  You will receive specific instructions about eating, activities and medications before you leave.    The above instructions have been reviewed and explained to me by   _______________________    I fully understand and can verbalize these instructions  _____________________________ Date _________

## 2010-03-11 NOTE — Procedures (Signed)
Summary: EGD   EGD  Procedure date:  10/14/2004  Findings:      Location: [facility]  Findings: Normal    EGD  Procedure date:  10/14/2004  Findings:      Location: [facility]  Findings: Normal   Morris Name: Billy Morris, Billy Morris MRN:  Procedure Procedures: Panendoscopy (EGD) CPT: 43235.    with biopsy(s)/brushing(s). CPT: D1846139.  Personnel: Endoscopist: Wilhemina Bonito. Marina Goodell, MD.  Referred By: Billy Hedges Plotnikov, MD.  Exam Location: Exam performed in Outpatient Clinic. Outpatient  Morris Consent: Procedure, Alternatives, Risks and Benefits discussed, consent obtained, from Morris. Consent was obtained by the RN.  Indications Symptoms: Abdominal pain, location: LUQ.  History  Current Medications: Morris is not currently taking Coumadin.  Pre-Exam Physical: Performed Oct 14, 2004  Entire physical exam was normal.  Exam Exam Info: Maximum depth of insertion Duodenum, intended Duodenum. Morris position: on left side. Vocal cords visualized. Gastric retroflexion performed. Images taken. ASA Classification: II. Tolerance: excellent.  Sedation Meds: Morris assessed and found to be appropriate for moderate (conscious) sedation. Fentanyl 75 mcg. given IV. Versed 10 mg. given IV. Cetacaine Spray given aerosolized.  Monitoring: BP and pulse monitoring done. Oximetry used. Supplemental O2 given  Findings Normal: Proximal Esophagus to Duodenal 2nd Portion. Biopsy/Normal taken. Comments: RUT bx taken.   Assessment Normal examination.  Comments: NO ABNORMALITIES OR CAUSE FOR PAIN FOUND Events  Unplanned Intervention: No unplanned interventions were required.  Unplanned Events: There were no complications. Plans Medication(s): PPI: Omeprazole/Prilosec 20 mg QD, for 4 wks.   Comments: LEVSIN SL AS NEEDED FOR PAIN Disposition: After procedure Morris sent to recovery. After recovery Morris sent home.  Scheduling: Follow-up prn.    Comments: RETURN TO Billy CARE OF DR Billy Morris  This report was created from Billy original endoscopy report, which was reviewed and signed by Billy above listed endoscopist.   cc:  Billy Primes, MD      Billy Barre, MD      Billy Morris

## 2010-03-11 NOTE — Assessment & Plan Note (Signed)
Summary: post hosp/cd   Vital Signs:  Patient profile:   40 year old male Height:      66 inches Weight:      181.50 pounds BMI:     29.40 O2 Sat:      97 % on Room air Temp:     98.5 degrees F oral Pulse rate:   65 / minute BP sitting:   100 / 68  (left arm) Cuff size:   regular  Vitals Entered By: Zella Ball Ewing CMA Duncan Dull) (October 21, 2009 10:13 AM)  O2 Flow:  Room air  CC: post Hospital/RE   CC:  post Hospital/RE.  History of Present Illness: here to f/u - still hurts to eat desptie relatively bland diet;  dicyclomine not really working or at least he is not sure;  Pt denies CP, worsening sob, doe, wheezing, orthopnea, pnd, worsening LE edema, palps, dizziness or syncope  Pt denies new neuro symptoms such as headache, facial or extremity weakness   No fever, , night sweats, loss of appetite or other constitutional symptoms .  lost 3 lbs from last visits.  Feels the soreness to the abd with riding in car on bumpy road, or even reaching up to get the kids bike down from the rack. Not yet seen Dr Perry/GI - has appt oct 4.  Had some loose stool recently after pizza. finished the prednisone from last visit.  Overall good compliacne with meds.  No n/v, fever, or blood.  Pain overall mostly central/epigastric,  no radiation to the back but has had some to the left side.  Saw ENT - CT of what sounds like bilat mastoid neg.  Allergy symptoms much improved for now, after the prednison without pressure or congestion.  no worsening anxiety or depressive symptoms, or panic.    Problems Prior to Update: 1)  Anal Fissure  (ICD-565.0) 2)  Hematochezia  (ICD-578.1) 3)  Hyperlipidemia  (ICD-272.4) 4)  Abdominal Pain, Generalized  (ICD-789.07) 5)  Preventive Health Care  (ICD-V70.0) 6)  Low Back Pain  (ICD-724.2) 7)  Gerd  (ICD-530.81) 8)  Anxiety  (ICD-300.00) 9)  Loss, Hearing Nos  (ICD-389.9) 10)  Asthma  (ICD-493.90) 11)  Allergic Rhinitis  (ICD-477.9)  Medications Prior to Update: 1)   Anusol-Hc 25 Mg Supp (Hydrocortisone Acetate) .Marland Kitchen.. 1 Pr Two Times A Day For 7 Days 2)  Proctofoam Hc 1-1 % Foam (Hydrocortisone Ace-Pramoxine) .... Use Asd Bid As Needed 3)  Prednisone 10 Mg Tabs (Prednisone) .... 3po Qd For 3days, Then 2po Qd For 3days, Then 1po Qd For 3days, Then Stop  Current Medications (verified): 1)  Anusol-Hc 25 Mg Supp (Hydrocortisone Acetate) .Marland Kitchen.. 1 Pr Two Times A Day For 7 Days 2)  Proctofoam Hc 1-1 % Foam (Hydrocortisone Ace-Pramoxine) .... Use Asd Bid As Needed 3)  Omeprazole 20 Mg Cpdr (Omeprazole) .Marland Kitchen.. 1po Once Daily  Allergies (verified): 1)  ! Asa 2)  ! Cipro 3)  ! 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  Head:  normocephalic and atraumatic.   Eyes:  vision grossly intact, pupils equal, and pupils round.   Ears:  R ear normal and L ear normal.   Nose:  no external deformity and no nasal discharge.   Mouth:  no gingival abnormalities and pharynx pink and moist.   Neck:  supple and no masses.   Lungs:  normal respiratory effort and normal breath sounds.   Heart:  normal rate and regular rhythm.   Abdomen:  soft and normal bowel sounds.  with tender ot the epigastrium, and somewhat luq, without guarding or rebound Extremities:  no edema, no erythema    Impression & Recommendations:  Problem # 1:  EPIGASTRIC PAIN (ICD-789.06)  pain more localized now it seems;  gave samples of aciphex 20 mg daily (and rx for prilosec); also  for labs and CT, and f/u with Dr Perry/GI as planned  Orders: TLB-BMP (Basic Metabolic Panel-BMET) (80048-METABOL) TLB-CBC Platelet - w/Differential (85025-CBCD) TLB-Hepatic/Liver Function Pnl (80076-HEPATIC) TLB-Lipase (83690-LIPASE) TLB-Sedimentation Rate (ESR) (85652-ESR) TLB-Udip w/ Micro (81001-URINE) Radiology Referral (Radiology)  Problem # 2:  ALLERGIC RHINITIS (ICD-477.9) improved after prednisone - stable overall by hx and exam, ok to continue meds/tx as is   Problem # 3:  ANXIETY (ICD-300.00) stable overall by hx and exam, ok to continue meds/tx as is - no further meds needed at this time  Complete Medication List: 1)  Anusol-hc 25 Mg Supp (Hydrocortisone acetate) .Marland Kitchen.. 1 pr two times a day for 7 days 2)  Proctofoam Hc 1-1 % Foam (Hydrocortisone ace-pramoxine) .... Use asd bid as needed 3)  Omeprazole 20 Mg Cpdr (Omeprazole) .Marland Kitchen.. 1po once daily  Patient Instructions: 1)  Please take the sample aciphex (antacid) at 1 pill per day 2)  after that, please Please take all new medications as prescribed  - the omeprazole (generic prilosec) 3)  Please go to the Lab in the basement for your blood and/or urine tests today  4)  You will be contacted about the referral(s) to: CT scan 5)  Please call the number on the [facility] Card for results of your testing  6)  Please keep your appt with Dr Marina Goodell Oct 4 as planned Prescriptions: OMEPRAZOLE 20 MG CPDR (OMEPRAZOLE) 1po once daily  #90 x 3   Entered and Authorized by:   Corwin Levins MD   Signed by:   Corwin Levins MD on 10/21/2009   Method used:   Print then Give to Patient   RxID:   [id number]

## 2010-03-11 NOTE — Progress Notes (Signed)
Summary: azithyromycin  Phone Note Call from Patient Call back at Work Phone [phone]   Caller: Patient Reason for Call: Talk to Doctor Summary of Call: Pt left msg on vm saw md early this week for abdominal pain. Also pt was c/o of sore throat. Pt states md said his lymph nodes was swollen. Pt want to know should he be taking antibitoc. Throat is still sore. Pls advise Initial call taken by: Orlan Leavens RMA,  October 24, 2009 11:19 AM  Follow-up for Phone Call        ok for zpack - done hardcopy to LIM side B - dahlia  Follow-up by: Corwin Levins MD,  October 24, 2009 1:06 PM  Additional Follow-up for Phone Call Additional follow up Details #1::        Called pt back to let him know md rx azithromycin. Pt want rx sent to [facility]. Fax script to pharm Additional Follow-up by: Orlan Leavens RMA,  October 24, 2009 1:24 PM    New/Updated Medications: AZITHROMYCIN 250 MG TABS (AZITHROMYCIN) 2po qd for 1 day, then 1po qd for 4days, then stop Prescriptions: AZITHROMYCIN 250 MG TABS (AZITHROMYCIN) 2po qd for 1 day, then 1po qd for 4days, then stop  #6 x 1   Entered by:   Orlan Leavens RMA   Authorized by:   Corwin Levins MD   Signed by:   Orlan Leavens RMA on 10/24/2009   Method used:   Faxed to ...       [facility]  [address]  [phone]* (retail)       [address]       [place], [place]  [address]       Ph: [phone] or [phone]       Fax: [phone]   RxID:   [id number] AZITHROMYCIN 250 MG TABS (AZITHROMYCIN) 2po qd for 1 day, then 1po qd for 4days, then stop  #6 x 1   Entered and Authorized by:   Corwin Levins MD   Signed by:   Corwin Levins MD on 10/24/2009   Method used:   Print then Give to Patient   RxID:   [id number]

## 2010-03-11 NOTE — Assessment & Plan Note (Signed)
Summary: HEMATOCHEZIA...    History of Present Illness Visit Type: Initial Consult Primary GI MD: Yancey Flemings MD Primary Provider: Oliver Barre, MD Requesting Provider: Oliver Barre, MD Chief Complaint: abdominal pain, some bleeding History of Present Illness:   40 year old white male with anxiety, asthma, dyslipidemia, and chronic abdominal complaints. He has been evaluated previously for chronic abdominal discomfort and felt to have nonulcer dyspepsia and or irritable bowel. Evaluation 2006 included normal upper endoscopy. This after prior history of treatment for H. pylori. No evidence for recurrent H. pylori. Prior abdominal ultrasound negative. Current history dates back approximately 6 weeks ago when the patient developed problems with sharp abdominal pain. Initially generalized. Subsequently in the lower abdomen and associated with diarrhea followed by constipation. Some minor bleeding. Also problems with nausea but no vomiting. Currently, bowel habits have improved. He does notice some upper abdominal discomfort sometimes worsened after meals, but not always. No weight loss. He also mentions chronic epigastric and left upper quadrant pain he has had for greater than 5 years. No change. He does mention significant stress related to issues at work as well as home. This coincided with many of his GI complaints. He was seen in the emergency room September 10. Subsequent evaluation with Dr. Jonny Ruiz September 12. Normal CBC, comprehensive metabolic panel, lipase, sedimentation rate, and urinalysis. He was placed on PPI therapy. He did not feel this helps. Felt it may have made symptoms worse. He discontinue this a few days ago. He also underwent CT scan of the abdomen and pelvis with contrast September 13. He was noted to have diverticulosis. Also mild wall thickening of the colon diffusely. Cause uncertain.   GI Review of Systems    Reports abdominal pain, bloating, and  nausea.     Location of  Abdominal  pain: generalized.    Denies acid reflux, belching, chest pain, dysphagia with liquids, dysphagia with solids, heartburn, loss of appetite, vomiting, vomiting blood, weight loss, and  weight gain.      Reports change in bowel habits, constipation, diarrhea, diverticulosis, and  rectal bleeding.     Denies anal fissure, black tarry stools, fecal incontinence, heme positive stool, hemorrhoids, irritable bowel syndrome, jaundice, light color stool, liver problems, and  rectal pain.    Current Medications (verified): 1)  Omeprazole 20 Mg Cpdr (Omeprazole) .Marland Kitchen.. 1po Once Daily 2)  Zyrtec Allergy 10 Mg Tbdp (Cetirizine Hcl) .... Once Daily  Allergies: 1)  ! Asa 2)  ! Cipro 3)  ! Sulfa 4)  ! Penicillin  Past History:  Past Medical History: Hearing Loss - chronic right Allergic rhinitis Asthma Anxiety GERD Low back pain hx of h pylori tx Hyperlipidemia Anal Fissure  Past Surgical History: Reviewed history from 10/06/2006 and no changes required. Denies surgical history  Family History: Reviewed history from 03/22/2008 and no changes required. 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  The patient denies anemia, arthritis/joint pain, blood in urine, breast changes/lumps, change in vision, confusion, cough, coughing up blood, depression-new, fainting, fatigue, fever, heart murmur, heart rhythm changes, itching, menstrual pain, muscle pains/cramps, night sweats, nosebleeds, pregnancy symptoms, skin rash, sleeping problems, swelling of feet/legs, thirst - excessive , urination - excessive ,  urination changes/pain, urine leakage, and vision  changes.    Vital Signs:  Patient profile:   40 year old male Height:      66 inches Weight:      181.25 pounds BMI:     29.36 Pulse rate:   68 / minute Pulse rhythm:   regular BP sitting:   110 / 68  (left arm) Cuff size:   regular  Vitals Entered By: June McMurray CMA Duncan Dull) (November 12, 2009 9:32 AM)  Physical Exam  General:  Well developed, well nourished, no acute distress. Head:  Normocephalic and atraumatic. Eyes:  PERRLA, no icterus. Nose:  No deformity, discharge,  or lesions. Mouth:  No deformity or lesions, dentition normal. Neck:  Supple; no masses or thyromegaly. Lungs:  Clear throughout to auscultation. Heart:  Regular rate and rhythm; no murmurs, rubs,  or bruits. Abdomen:  Soft, nontender and nondistended. No masses, hepatosplenomegaly or hernias noted. Normal bowel sounds. Rectal:  deferred until colonoscopy Msk:  Symmetrical with no gross deformities. Normal posture. Pulses:  Normal pulses noted. Extremities:  No clubbing, cyanosis, edema or deformities noted. Neurologic:  Alert and  oriented x4;  grossly normal neurologically. Skin:  Intact without significant lesions or rashes. Psych:  Alert and cooperative. Normal mood and affect.   Impression & Recommendations:  Problem # 1:  EPIGASTRIC PAIN (ICD-789.06) recent problems with abdominal pain. Suspect combination of irritable bowel exacerbated by stress and/or nonspecific transient infectious process. Currently with residual upper abdominal pain, likely nonulcer dyspepsia.  Plan: #1. Diagnostic upper endoscopy to evaluate persistent somewhat worsening pain.  Problem # 2:  NONSPECIFIC ABN FINDING RAD & OTH EXAM GI TRACT (ICD-793.4) CT scan revealing colonic thickening. This in the presence of diverticular disease, change in bowel habits, abdominal pain, and minor bleeding. The CT abnormality could represent a transient infectious colitis, more chronic colitis, benign diverticular thickening, or artifact.  Further evaluation is indicated.  Plan: #1. Colonoscopy. The nature of the procedure as well as the risks, benefits, and alternatives were reviewed. He understood and agreed to proceed. #2. Movi prep prescribed. The patient instructed on its use  Problem # 3:  CHANGE IN BOWELS (ICD-787.99) nonspecific. Improved. May represent transient infectious process or IBS effect. Evaluate with colonoscopy as above.  Problem # 4:  HEMATOCHEZIA (ICD-578.1) minor / transient. History of fissure.. Evaluate with colonoscopy  Other Orders: Colon/Endo (Colon/Endo)  Patient Instructions: 1)  Colonoscopy [facility] 12/17/09 2:00 pm arrive at 1:00 pm 2)  Movi prep instructions given to patient. 3)  Movi prep Rx. sent to pharmacy. 4)  Colonoscopy and Flexible Sigmoidoscopy brochure given.  5)  Upper Endoscopy brochure given.  6)  Copy sent to : Oliver Barre, MD 7)  The medication list was reviewed and reconciled.  All changed / newly prescribed medications were explained.  A complete medication list was provided to the patient / caregiver. Prescriptions: MOVIPREP 100 GM  SOLR (PEG-KCL-NACL-NASULF-NA ASC-C) As per prep instructions.  #1 x 0   Entered by:   Milford Cage NCMA   Authorized by:   Hilarie Fredrickson MD   Signed by:   Milford Cage NCMA on 11/12/2009   Method used:   Electronically to        [facility]  [phone]* (retail)       [address]       [place], [place]  [address]       Ph: [phone] or [phone]  Fax: [phone]   RxID:   [id number]

## 2010-03-11 NOTE — Assessment & Plan Note (Signed)
Summary: SORE THROAT/ EAR PAIN/NWS   Vital Signs:  Patient profile:   40 year old male Height:      66 inches Weight:      182.13 pounds BMI:     29.50 O2 Sat:      97 % on Room air Temp:     98.8 degrees F oral Pulse rate:   84 / minute BP sitting:   112 / 62  (left arm) Cuff size:   regular  Vitals Entered By: Zella Ball Ewing CMA (AAMA) (November 26, 2009 3:30 PM)  O2 Flow:  Room air CC: Sore throat and ear pain/RE   Primary Care Provider:  Oliver Barre, MD  CC:  Sore throat and ear pain/RE.  History of Present Illness: here with ongoing recurring , mostly persistent sore throat, without No fever, wt loss, night sweats, loss of appetite or other constitutional symptoms .   Saw ENT with what sounds like neg look at the vocal cords by pharyngoscopy, tx pt with increasing  the omeprazole but unfort had increased abd pain (no diarrhea) so had to stop the PPI complettely with recurrent of reflux worse at night and  still with ST, also has significant nasal and sinus allrgy symptoms with ears popping, also with recent 4 wks midl wheeze/DOE c/w his previous asthma for which the albuterol hlps he has left over but has ot use more than 2 times per wk (no night time awakenings however).  Pt denies cp, orthopnea, pnd, worsening LE edema, palps, dizziness or syncope . Has run out of allergy meds as well as not taking the PPI.    Problems Prior to Update: 1)  Sore Throat  (ICD-462) 2)  Cough  (ICD-786.2) 3)  Change in Bowels  (ICD-787.99) 4)  Nonspecific Abn Finding Rad & Oth Exam Gi Tract  (ICD-793.4) 5)  Epigastric Pain  (ICD-789.06) 6)  Anal Fissure  (ICD-565.0) 7)  Hematochezia  (ICD-578.1) 8)  Hyperlipidemia  (ICD-272.4) 9)  Abdominal Pain, Generalized  (ICD-789.07) 10)  Preventive Health Care  (ICD-V70.0) 11)  Low Back Pain  (ICD-724.2) 12)  Gerd  (ICD-530.81) 13)  Anxiety  (ICD-300.00) 14)  Loss, Hearing Nos  (ICD-389.9) 15)  Asthma  (ICD-493.90) 16)  Allergic Rhinitis   (ICD-477.9)  Medications Prior to Update: 1)  Omeprazole 20 Mg Cpdr (Omeprazole) .Marland Kitchen.. 1po Once Daily 2)  Zyrtec Allergy 10 Mg Tbdp (Cetirizine Hcl) .... Once Daily 3)  Moviprep 100 Gm  Solr (Peg-Kcl-Nacl-Nasulf-Na Asc-C) .... As Per Prep Instructions.  Current Medications (verified): 1)  Lansoprazole 30 Mg Cpdr (Lansoprazole) .Marland Kitchen.. 1 By Mouth Two Times A Day 2)  Levocetirizine Dihydrochloride 5 Mg Tabs (Levocetirizine Dihydrochloride) .Marland Kitchen.. 1po Once Daily For Allergies 3)  Moviprep 100 Gm  Solr (Peg-Kcl-Nacl-Nasulf-Na Asc-C) .... As Per Prep Instructions. 4)  Fluticasone Propionate 50 Mcg/act Susp (Fluticasone Propionate) .... 2 Spray/side Once Daily 5)  Proair Hfa 108 (90 Base) Mcg/act Aers (Albuterol Sulfate) .... 2 Puff Four Times Per Day As Needed Shortness of Breath 6)  Qvar 80 Mcg/act Aers (Beclomethasone Dipropionate) .Marland Kitchen.. 1 Puff Two Times A Day  Allergies (verified): 1)  ! Asa 2)  ! Cipro 3)  ! Sulfa 4)  ! Penicillin  Past History:  Past Medical History: Last updated: 11/12/2009 Hearing Loss - chronic right Allergic rhinitis Asthma Anxiety GERD Low back pain hx of h pylori tx Hyperlipidemia Anal Fissure  Past Surgical History: Last updated: 10/06/2006 Denies surgical history  Social History: Last updated: 11/12/2009 Married 2 children work -  machine operator Never Smoked Alcohol use-no Drug use-no Daily Caffeine Use  Risk Factors: Smoking Status: never (03/22/2008)  Review of Systems       all otherwise negative per pt -    Physical Exam  General:  alert and overweight-appearing.   Head:  normocephalic and atraumatic.   Eyes:  vision grossly intact, pupils equal, and pupils round.   Ears:  R ear normal.  and left tm mild erythema Nose:  no external deformity and no nasal discharge.   Mouth:  pharyngeal erythema and fair dentition.   Neck:  supple and no masses.   Lungs:  normal respiratory effort and normal breath sounds.   Heart:  normal rate  and regular rhythm.   Abdomen:  soft and normal bowel sounds.  and mild epigastric tender Extremities:  no edema, no erythema    Impression & Recommendations:  Problem # 1:  SORE THROAT (ICD-462) multifactorial it seems with allergies, asthma and uncontrolled reflux - to tx as below, no indication for antibx at this time  Problem # 2:  GERD (ICD-530.81)  His updated medication list for this problem includes:    Lansoprazole 30 Mg Cpdr (Lansoprazole) .Marland Kitchen... 1 by mouth two times a day treat as above, f/u any worsening signs or symptoms   Problem # 3:  ALLERGIC RHINITIS (ICD-477.9)  His updated medication list for this problem includes:    Levocetirizine Dihydrochloride 5 Mg Tabs (Levocetirizine dihydrochloride) .Marland Kitchen... 1po once daily for allergies    Fluticasone Propionate 50 Mcg/act Susp (Fluticasone propionate) .Marland Kitchen... 2 spray/side once daily treat as above, f/u any worsening signs or symptoms , as well as mucinex two times a day   Problem # 4:  ASTHMA (ICD-493.90)  His updated medication list for this problem includes:    Proair Hfa 108 (90 Base) Mcg/act Aers (Albuterol sulfate) .Marland Kitchen... 2 puff four times per day as needed shortness of breath    Qvar 80 Mcg/act Aers (Beclomethasone dipropionate) .Marland Kitchen... 1 puff two times a day treat as above, f/u any worsening signs or symptoms   Complete Medication List: 1)  Lansoprazole 30 Mg Cpdr (Lansoprazole) .Marland Kitchen.. 1 by mouth two times a day 2)  Levocetirizine Dihydrochloride 5 Mg Tabs (Levocetirizine dihydrochloride) .Marland Kitchen.. 1po once daily for allergies 3)  Moviprep 100 Gm Solr (Peg-kcl-nacl-nasulf-na asc-c) .... As per prep instructions. 4)  Fluticasone Propionate 50 Mcg/act Susp (Fluticasone propionate) .... 2 spray/side once daily 5)  Proair Hfa 108 (90 Base) Mcg/act Aers (Albuterol sulfate) .... 2 puff four times per day as needed shortness of breath 6)  Qvar 80 Mcg/act Aers (Beclomethasone dipropionate) .Marland Kitchen.. 1 puff two times a day  Patient  Instructions: 1)  Please take all new medications as prescribed   - the generic prevacid two times a day, generic flonase, generic xyzal (the new and improved zyrtec), qvar at 1 puff two times a day for asthma, and albuterol inhaler as needed  2)  You can also use Mucinex OTC or it's generic for congestion and ear popping 3)  Please schedule a follow-up appointment as needed. Prescriptions: QVAR 80 MCG/ACT AERS (BECLOMETHASONE DIPROPIONATE) 1 puff two times a day  #1 x 11   Entered and Authorized by:   Corwin Levins MD   Signed by:   Corwin Levins MD on 11/26/2009   Method used:   Print then Give to Patient   RxID:   [id number] PROAIR HFA 108 (90 BASE) MCG/ACT AERS (ALBUTEROL SULFATE) 2 puff four times per day as  needed shortness of breath  #1 x 11   Entered and Authorized by:   Corwin Levins MD   Signed by:   Corwin Levins MD on 11/26/2009   Method used:   Print then Give to Patient   RxID:   [id number] FLUTICASONE PROPIONATE 50 MCG/ACT SUSP (FLUTICASONE PROPIONATE) 2 spray/side once daily  #1 x 11   Entered and Authorized by:   Corwin Levins MD   Signed by:   Corwin Levins MD on 11/26/2009   Method used:   Print then Give to Patient   RxID:   [id number] LANSOPRAZOLE 30 MG CPDR (LANSOPRAZOLE) 1 by mouth two times a day  #60 x 11   Entered and Authorized by:   Corwin Levins MD   Signed by:   Corwin Levins MD on 11/26/2009   Method used:   Print then Give to Patient   RxID:   [id number] LEVOCETIRIZINE DIHYDROCHLORIDE 5 MG TABS (LEVOCETIRIZINE DIHYDROCHLORIDE) 1po once daily for allergies  #30 x 11   Entered and Authorized by:   Corwin Levins MD   Signed by:   Corwin Levins MD on 11/26/2009   Method used:   Print then Give to Patient   RxID:   [id number]    Orders Added: 1)  Est. Patient Level IV [42595]

## 2010-03-11 NOTE — Assessment & Plan Note (Signed)
Summary: ABD PAIN-LB   Vital Signs:  Patient profile:   40 year old male Height:      66 inches Weight:      180 pounds BMI:     29.16 O2 Sat:      98 % on Room air Temp:     97.8 degrees F oral Pulse rate:   74 / minute BP sitting:   120 / 82  (left arm) Cuff size:   regular  Vitals Entered ByZella Ball Ewing (April 18, 2009 1:40 PM)  O2 Flow:  Room air  CC: Abdominal pain/RE   CC:  Abdominal pain/RE.  History of Present Illness: here with 2 wks onset abd pains , seemed to start the next day after getting poked in the stomach by daguhter being playful., but since then has nausea, pain as before to the upper bilat abd area near the ribs;  has hx of diverticulosis, then bowel changes to intermittent constipation;  soreness persists;  no vomitng, no significant pyrosis , has had some gas and belching but leess lately;; no blood with vomiting or BM's, no radiation of the pain to the back, no wt loss, night sweats, fever, diarrhea.  Still works second shift, sometimes misses meals and seems to aggrevate.  Pt denies CP, sob, doe, wheezing, orthopnea, pnd, worsening LE edema, palps, dizziness or syncope   Pt denies new neuro symptoms such as headache, facial or extremity weakness.  has stresful job, but denies increased recently, makes  automotive parts.  Gained 2 lbs since last yr.    Problems Prior to Update: 1)  Abdominal Pain, Generalized  (ICD-789.07) 2)  Preventive Health Care  (ICD-V70.0) 3)  Low Back Pain  (ICD-724.2) 4)  Gerd  (ICD-530.81) 5)  Anxiety  (ICD-300.00) 6)  Loss, Hearing Nos  (ICD-389.9) 7)  Asthma  (ICD-493.90) 8)  Allergic Rhinitis  (ICD-477.9)  Medications Prior to Update: 1)  None  Current Medications (verified): 1)  Hyoscyamine Sulfate 0.125 Mg Tabs (Hyoscyamine Sulfate) .Marland Kitchen.. 1 By Mouth Q 8 Hrs As Needed 2)  Omeprazole 20 Mg Cpdr (Omeprazole) .... 2 By Mouth Once Daily  Allergies (verified): 1)  ! Asa 2)  ! Cipro 3)  ! 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         all otherwise negative per pt -    Physical Exam  General:  alert and overweight-appearing.   Head:  normocephalic and atraumatic.   Eyes:  vision grossly intact, pupils equal, and pupils round.   Ears:  R ear normal and L ear normal.   Nose:  no external deformity and no nasal discharge.   Mouth:  no gingival abnormalities and pharynx pink and moist.   Neck:  supple and no  masses.   Lungs:  normal respiratory effort and normal breath sounds.   Heart:  normal rate and regular rhythm.   Abdomen:  soft, non-tender, normal bowel sounds, no masses, and no abdominal hernia.   Msk:  no joint tenderness and no joint swelling.   Extremities:  no edema, no erythema  Neurologic:  cranial nerves II-XII intact and strength normal in all extremities.  except for decreased right hearing Skin:  color normal and no rashes.   Psych:  moderately anxious.     Impression & Recommendations:  Problem # 1:  Preventive Health Care (ICD-V70.0)  Overall doing  well, age appropriate education and counseling updated and referral for appropriate preventive services done unless declined, immunizations up to date or declined, diet counseling done if overweight, urged to quit smoking if smokes , most recent labs reviewed and current ordered if appropriate, ecg reviewed or declined (interpretation per ECG scanned in the EMR if done); information regarding Medicare Prevention requirements given if appropriate   Orders: TLB-BMP (Basic Metabolic Panel-BMET) (80048-METABOL) TLB-CBC Platelet - w/Differential (85025-CBCD) TLB-Hepatic/Liver Function Pnl (80076-HEPATIC) TLB-Lipid Panel (80061-LIPID) TLB-TSH (Thyroid Stimulating Hormone) (84443-TSH) TLB-Udip ONLY (81003-UDIP)  Problem # 2:  LOSS, HEARING NOS (ICD-389.9)  right side , refer ENT  Orders: ENT Referral (ENT)  Problem # 3:  ABDOMINAL PAIN, GENERALIZED (ICD-789.07) suspect functional problem, for hyscyamine as needed, and PPI re-start;  ok to follow , consider GI re-eval if persists  Complete Medication List: 1)  Hyoscyamine Sulfate 0.125 Mg Tabs (Hyoscyamine sulfate) .Marland Kitchen.. 1 by mouth q 8 hrs as needed 2)  Omeprazole 20 Mg Cpdr (Omeprazole) .... 2 by mouth once daily  Patient Instructions: 1)  You will be contacted about the referral(s) to: ENT for the hearing loss 2)  Please go to the Lab in the basement for your blood and/or urine tests today  3)  Please take all new medications as prescribed 4)  Continue all previous medications as before this visit  5)  Please schedule a follow-up appointment in 1 year or sooner if needed Prescriptions: OMEPRAZOLE 20 MG CPDR (OMEPRAZOLE) 2 by mouth once daily  #60 x 11   Entered and Authorized by:   Corwin Levins MD   Signed by:   Corwin Levins MD on 04/18/2009   Method used:   Print then Give to Patient   RxID:   [id number] HYOSCYAMINE SULFATE 0.125 MG TABS (HYOSCYAMINE SULFATE) 1 by mouth q 8 hrs as needed  #60 x 1   Entered and Authorized by:    Corwin Levins MD   Signed by:   Corwin Levins MD on 04/18/2009   Method used:   Print then Give to Patient   RxID:   [id number]

## 2010-03-13 NOTE — Progress Notes (Signed)
Summary: Ear pain  Phone Note Call from Patient Call back at Work Phone [phone]   Caller: Patient Summary of Call: Pt called stating that he has completed course of ABX but is still experiencing dizziness, difficulty with balance, ear pain and slight ST. Pt is concerned that sxs have not resolved with ABX and is requesting MD advisement. Initial call taken by: Margaret Pyle, CMA,  February 18, 2010 10:57 AM  Follow-up for Phone Call        I dont have anything else to offer except for tylenol as needed, mucinex otc as needed, and meclizine as needed for dizziness;  these symtpoms sometimes take several wks to finally resolve; we could consider ENT but normally tubes in the ears are a last resort Follow-up by: Corwin Levins MD,  February 18, 2010 12:38 PM  Additional Follow-up for Phone Call Additional follow up Details #1::        Pt advised and states he is currently seeing an ENT specialist and is there is no improvement in a few weeks he will contact there office. Additional Follow-up by: Margaret Pyle, CMA,  February 18, 2010 1:12 PM    New/Updated Medications: MECLIZINE HCL 12.5 MG TABS (MECLIZINE HCL) 1-2 by mouth qid as needed dizziness Prescriptions: MECLIZINE HCL 12.5 MG TABS (MECLIZINE HCL) 1-2 by mouth qid as needed dizziness  #50 x 1   Entered and Authorized by:   Corwin Levins MD   Signed by:   Corwin Levins MD on 02/18/2010   Method used:   Electronically to        [facility]  [phone]* (retail)       [address]       [place], [place]  [address]       Ph: [phone] or [phone]       Fax: [phone]   RxID:   [id number]

## 2010-03-13 NOTE — Letter (Signed)
Summary: [facility]  [facility]   Imported By: Sherian Rein 02/05/2010 12:25:49  _____________________________________________________________________  External Attachment:    Type:   Image     Comment:   External Document

## 2010-03-13 NOTE — Assessment & Plan Note (Signed)
Summary: EAR PAIN/ DIZZY /NWS   Vital Signs:  Patient profile:   40 year old male Height:      66 inches Weight:      187.50 pounds BMI:     30.37 O2 Sat:      98 % on Room air Temp:     99.4 degrees F oral Pulse rate:   78 / minute BP sitting:   120 / 80  (left arm) Cuff size:   regular  Vitals Entered By: Zella Ball Ewing CMA Duncan Dull) (February 11, 2010 1:46 PM)  O2 Flow:  Room air CC: Ear Pain, both ears/RE   Primary Care Provider:  Oliver Barre, MD  CC:  Ear Pain and both ears/RE.  History of Present Illness: here with acute onset mild to mod x 3 days left earache with fever and occasional popping and crackling, already had some hearing loss but not really worse, no vertigo, n/v but does have headache, but no ST, cough;  has stable nasall allergy symptoms for the last few months as long as he takes his meds; no night time wheezing awakenings or increased use of inhaler recetnly,  and Pt denies CP, worsening sob, doe, wheezing, orthopnea, pnd, worsening LE edema, palps, dizziness or syncope  Pt denies new neuro symptoms such as headache, facial or extremity weakness  Pt denies polydipsia, polyuria  Overall good compliance with meds, trying to follow low chol diet, wt stable, little excercise however  Did have some vocal cord inflammation per ENT but no worsening hoarseness, wheezing or cough.  No recent wt loss, night sweats, loss of appetite or other constitutional symptoms .  Denies worsening depressive symptoms, suicidal ideation, or panic.  though does have ongoing stressors and anxiety, though no worse recently.    Problems Prior to Update: 1)  Unspecified Eustachian Tube Disorder  (ICD-381.9) 2)  Otitis Media, Left  (ICD-382.9) 3)  Sinusitis- Acute-nos  (ICD-461.9) 4)  Sore Throat  (ICD-462) 5)  Cough  (ICD-786.2) 6)  Change in Bowels  (ICD-787.99) 7)  Nonspecific Abn Finding Rad & Oth Exam Gi Tract  (ICD-793.4) 8)  Epigastric Pain  (ICD-789.06) 9)  Anal Fissure  (ICD-565.0) 10)   Hematochezia  (ICD-578.1) 11)  Hyperlipidemia  (ICD-272.4) 12)  Abdominal Pain, Generalized  (ICD-789.07) 13)  Preventive Health Care  (ICD-V70.0) 14)  Low Back Pain  (ICD-724.2) 15)  Gerd  (ICD-530.81) 16)  Anxiety  (ICD-300.00) 17)  Loss, Hearing Nos  (ICD-389.9) 18)  Asthma  (ICD-493.90) 19)  Allergic Rhinitis  (ICD-477.9)  Medications Prior to Update: 1)  Lansoprazole 30 Mg Cpdr (Lansoprazole) .Marland Kitchen.. 1 By Mouth Two Times A Day 2)  Levocetirizine Dihydrochloride 5 Mg Tabs (Levocetirizine Dihydrochloride) .Marland Kitchen.. 1po Once Daily For Allergies 3)  Fluticasone Propionate 50 Mcg/act Susp (Fluticasone Propionate) .... 2 Spray/side Once Daily 4)  Proair Hfa 108 (90 Base) Mcg/act Aers (Albuterol Sulfate) .... 2 Puff Four Times Per Day As Needed Shortness of Breath 5)  Qvar 80 Mcg/act Aers (Beclomethasone Dipropionate) .Marland Kitchen.. 1 Puff Two Times A Day 6)  Doxycycline Hyclate 100 Mg Caps (Doxycycline Hyclate) .Marland Kitchen.. 1po Two Times A Day  Current Medications (verified): 1)  Lansoprazole 30 Mg Cpdr (Lansoprazole) .Marland Kitchen.. 1 By Mouth Two Times A Day 2)  Levocetirizine Dihydrochloride 5 Mg Tabs (Levocetirizine Dihydrochloride) .Marland Kitchen.. 1po Once Daily For Allergies 3)  Fluticasone Propionate 50 Mcg/act Susp (Fluticasone Propionate) .... 2 Spray/side Once Daily 4)  Proair Hfa 108 (90 Base) Mcg/act Aers (Albuterol Sulfate) .... 2 Puff Four Times  Per Day As Needed Shortness of Breath 5)  Qvar 80 Mcg/act Aers (Beclomethasone Dipropionate) .Marland Kitchen.. 1 Puff Two Times A Day 6)  Azithromycin 250 Mg Tabs (Azithromycin) .... 2po Qd For 1 Day, Then 1po Qd For 4days, Then Stop  Allergies (verified): 1)  ! Asa 2)  ! Cipro 3)  ! Sulfa 4)  ! 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  , mild ill  Head:  normocephalic and atraumatic.   Eyes:  vision grossly intact, pupils equal, and pupils round.   Ears:  bilat tm's red let > right, nonbulging, canals clear, sinus nontender bilat Nose:  nasal dischargemucosal pallor and mucosal edema.   Mouth:  pharyngeal erythema and fair dentition.   Neck:  supple and no masses.   Lungs:  normal respiratory effort and normal breath sounds.   Heart:  normal rate and regular rhythm.   Extremities:  no edema, no erythema    Impression & Recommendations:  Problem # 1:  OTITIS MEDIA, LEFT (ICD-382.9)  His updated medication list for this problem includes:    Azithromycin 250 Mg Tabs (Azithromycin) .Marland Kitchen... 2po qd for 1 day, then 1po qd for 4days, then stop treat as above, f/u any worsening signs or symptoms   Problem # 2:  UNSPECIFIED EUSTACHIAN TUBE DISORDER (ICD-381.9) for mucinex D otc as needed   Problem # 3:  ALLERGIC RHINITIS (ICD-477.9)  His updated medication list for this problem includes:    Levocetirizine Dihydrochloride 5 Mg Tabs (Levocetirizine dihydrochloride) .Marland Kitchen... 1po once daily for allergies    Fluticasone Propionate 50 Mcg/act Susp (Fluticasone propionate) .Marland Kitchen... 2 spray/side once daily stable overall by hx and exam, ok to continue meds/tx as is   Discussed use of allergy medications and environmental measures.   Problem # 4:  ASTHMA (ICD-493.90)  His updated medication list for this problem includes:    Proair Hfa 108 (90 Base) Mcg/act Aers (Albuterol sulfate) .Marland Kitchen... 2 puff four times per day as needed shortness of breath    Qvar 80 Mcg/act Aers (Beclomethasone dipropionate) .Marland Kitchen... 1 puff two times a  day stable overall by hx and exam, ok to continue meds/tx as is   Pulmonary Functions Reviewed: O2 sat: 98 (02/11/2010)  Complete Medication List: 1)  Lansoprazole 30 Mg Cpdr (Lansoprazole) .Marland Kitchen.. 1 by mouth two times a day 2)  Levocetirizine Dihydrochloride 5 Mg Tabs (Levocetirizine dihydrochloride) .Marland Kitchen.. 1po once daily for allergies 3)  Fluticasone Propionate 50 Mcg/act Susp (Fluticasone propionate) .... 2 spray/side once daily 4)  Proair Hfa 108 (90 Base) Mcg/act Aers (Albuterol sulfate) .... 2 puff four times per day as needed shortness of breath 5)  Qvar 80 Mcg/act Aers (Beclomethasone dipropionate) .Marland Kitchen.. 1 puff two times a day 6)  Azithromycin 250 Mg Tabs (Azithromycin) .... 2po qd for 1 day, then 1po qd for 4days, then stop  Patient Instructions: 1)  Please take all new medications as prescribed 2)  Continue all previous medications as before this visit  3)  You can also use Mucinex, or Mucinex D  OTC or it's generic for congestion and ear pain 4)  You can also take tylenol 8 hr as needed for the pain as well 5)  Please call if you feel you need the referral to ENT at [facility] 6)  Please schedule a follow-up appointment as needed. Prescriptions: AZITHROMYCIN 250 MG TABS (AZITHROMYCIN) 2po qd for 1 day, then 1po qd for 4days, then stop  #6 x 1   Entered and Authorized by:   Corwin Levins MD   Signed by:   Corwin Levins MD on 02/11/2010   Method used:   Print then Give to Patient   RxID:   [id number]    Orders Added: 1)  Est. Patient Level IV [01027]

## 2010-03-13 NOTE — Assessment & Plan Note (Signed)
Summary: EAR PAIN   SORE THROAT--NECK SORENESS---STC   Vital Signs:  Patient profile:   40 year old male Height:      66 inches Weight:      187.25 pounds BMI:     30.33 O2 Sat:      98 % on Room air Temp:     98.9 degrees F oral Pulse rate:   78 / minute BP sitting:   116 / 82  (left arm) Cuff size:   regular  Vitals Entered By: Zella Ball Ewing CMA Duncan Dull) (January 27, 2010 2:31 PM)  O2 Flow:  Room air  CC: Ear pain, ST x several weeks   Primary Care Sunil Hue:  Oliver Barre, MD  CC:  Ear pain and ST x several weeks.  History of Present Illness: here to f/u;  c/o 3 days onset mild to mod facial pain, pressure, fever and greenish d/c; with mild headache, general weakness and malaise;  Pt denies CP, worsening sob, doe, wheezing, orthopnea, pnd, worsening LE edema, palps, dizziness or syncope  Pt denies new neuro symptoms such as headache, facial or extremity weakness  Pt denies polydipsia, polyuria   Overall good compliance with meds, trying to follow low chol diet, wt stable, little excercise however.  ST resolved with PPI previously and remains well.  No overt reflux, or dysphagia, n/v, abd pain, bowel changes or blood.  No wheezing or night time awakening. Overall good compliance with meds, and good tolerability.  Denies worsening depressive symptoms, suicidal ideation, or panic, though has ongoing mild to mod anxiety, but declines further meds at this time.  Has some ongoing sinus congestion and pressure prior to onset this illness, and has an appt with ENT soon.  Problems Prior to Update: 1)  Sinusitis- Acute-nos  (ICD-461.9) 2)  Sore Throat  (ICD-462) 3)  Cough  (ICD-786.2) 4)  Change in Bowels  (ICD-787.99) 5)  Nonspecific Abn Finding Rad & Oth Exam Gi Tract  (ICD-793.4) 6)  Epigastric Pain  (ICD-789.06) 7)  Anal Fissure  (ICD-565.0) 8)  Hematochezia  (ICD-578.1) 9)  Hyperlipidemia  (ICD-272.4) 10)  Abdominal Pain, Generalized  (ICD-789.07) 11)  Preventive Health Care   (ICD-V70.0) 12)  Low Back Pain  (ICD-724.2) 13)  Gerd  (ICD-530.81) 14)  Anxiety  (ICD-300.00) 15)  Loss, Hearing Nos  (ICD-389.9) 16)  Asthma  (ICD-493.90) 17)  Allergic Rhinitis  (ICD-477.9)  Medications Prior to Update: 1)  Lansoprazole 30 Mg Cpdr (Lansoprazole) .Marland Kitchen.. 1 By Mouth Two Times A Day 2)  Levocetirizine Dihydrochloride 5 Mg Tabs (Levocetirizine Dihydrochloride) .Marland Kitchen.. 1po Once Daily For Allergies 3)  Fluticasone Propionate 50 Mcg/act Susp (Fluticasone Propionate) .... 2 Spray/side Once Daily 4)  Proair Hfa 108 (90 Base) Mcg/act Aers (Albuterol Sulfate) .... 2 Puff Four Times Per Day As Needed Shortness of Breath 5)  Qvar 80 Mcg/act Aers (Beclomethasone Dipropionate) .Marland Kitchen.. 1 Puff Two Times A Day  Current Medications (verified): 1)  Lansoprazole 30 Mg Cpdr (Lansoprazole) .Marland Kitchen.. 1 By Mouth Two Times A Day 2)  Levocetirizine Dihydrochloride 5 Mg Tabs (Levocetirizine Dihydrochloride) .Marland Kitchen.. 1po Once Daily For Allergies 3)  Fluticasone Propionate 50 Mcg/act Susp (Fluticasone Propionate) .... 2 Spray/side Once Daily 4)  Proair Hfa 108 (90 Base) Mcg/act Aers (Albuterol Sulfate) .... 2 Puff Four Times Per Day As Needed Shortness of Breath 5)  Qvar 80 Mcg/act Aers (Beclomethasone Dipropionate) .Marland Kitchen.. 1 Puff Two Times A Day 6)  Doxycycline Hyclate 100 Mg Caps (Doxycycline Hyclate) .Marland Kitchen.. 1po Two Times A Day  Allergies: 1)  !  Asa 2)  ! Cipro 3)  ! Sulfa 4)  ! 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  , mild ill  Head:  normocephalic and atraumatic.   Eyes:  vision grossly intact, pupils equal, and pupils round.   Ears:  bilat tm's red, nonbulging, canals clear, sinus tender bilat Nose:  nasal dischargemucosal pallor and mucosal edema.   Mouth:  pharyngeal erythema and fair dentition.   Neck:  supple and no masses.   Lungs:  normal respiratory effort and normal breath sounds.   Heart:  normal rate and regular rhythm.   Abdomen:  soft and normal bowel sounds.  and mild epigastric tender Extremities:  no edema, no erythema    Impression & Recommendations:  Problem # 1:  SINUSITIS- ACUTE-NOS (ICD-461.9)  His updated medication list for this problem includes:    Fluticasone Propionate 50 Mcg/act Susp (Fluticasone propionate) .Marland Kitchen... 2 spray/side once daily    Doxycycline Hyclate 100 Mg Caps (Doxycycline hyclate) .Marland Kitchen... 1po two times a day treat as above, f/u any worsening signs or symptoms   Problem # 2:  GERD (ICD-530.81)  His updated medication list for this problem includes:    Lansoprazole 30 Mg Cpdr (Lansoprazole) .Marland Kitchen... 1 by mouth two times a day to cont the PPI -, stable overall by hx and exam, ok to continue meds/tx as is   Problem # 3:  ASTHMA (ICD-493.90)  His updated medication list for this problem includes:    Proair Hfa 108 (90 Base) Mcg/act Aers (Albuterol sulfate) .Marland Kitchen... 2 puff four times per day as needed shortness of breath    Qvar 80 Mcg/act Aers (Beclomethasone dipropionate) .Marland Kitchen... 1 puff two times a day stable overall by hx and exam, ok to continue meds/tx as is   Problem # 4:  ANXIETY (ICD-300.00) stable overall by hx and exam, ok to continue meds/tx as is - declines further meds at this time  Complete Medication List: 1)  Lansoprazole 30 Mg Cpdr (Lansoprazole) .Marland Kitchen.. 1 by mouth two times a day 2)  Levocetirizine Dihydrochloride 5 Mg Tabs (Levocetirizine dihydrochloride) .Marland Kitchen.. 1po once daily  for allergies 3)  Fluticasone Propionate 50 Mcg/act Susp (Fluticasone propionate) .... 2 spray/side once daily 4)  Proair Hfa 108 (90 Base) Mcg/act Aers (Albuterol sulfate) .... 2 puff four times per day as needed shortness of breath 5)  Qvar 80 Mcg/act Aers (Beclomethasone dipropionate) .Marland Kitchen.. 1 puff two times a day 6)  Doxycycline Hyclate 100 Mg Caps (Doxycycline hyclate) .Marland Kitchen.. 1po two times a day  Patient Instructions: 1)  Please take all new medications as prescribed 2)  Continue all previous medications as before this visit  3)  You can also use Mucinex OTC or it's generic for congestion  4)  Please see your ENT as you have planned 5)  Please schedule a follow-up appointment in june 2012 for CPX with labs (including PSA) Prescriptions: DOXYCYCLINE HYCLATE 100 MG CAPS (DOXYCYCLINE HYCLATE) 1po two times a day  #20  x 0   Entered and Authorized by:   Corwin Levins MD   Signed by:   Corwin Levins MD on 01/27/2010   Method used:   Print then Give to Patient   RxID:   [id number] LEVOFLOXACIN 500 MG TABS (LEVOFLOXACIN) 1po once daily  #10 x 0   Entered and Authorized by:   Corwin Levins MD   Signed by:   Corwin Levins MD on 01/27/2010   Method used:   Print then Give to Patient   RxID:   [id number]    Orders Added: 1)  Est. Patient Level IV [95284]

## 2010-03-25 ENCOUNTER — Encounter: Payer: Self-pay | Admitting: Internal Medicine

## 2010-03-25 ENCOUNTER — Ambulatory Visit (INDEPENDENT_AMBULATORY_CARE_PROVIDER_SITE_OTHER): Payer: BC Managed Care – PPO | Admitting: Internal Medicine

## 2010-03-25 ENCOUNTER — Ambulatory Visit (INDEPENDENT_AMBULATORY_CARE_PROVIDER_SITE_OTHER)
Admission: RE | Admit: 2010-03-25 | Discharge: 2010-03-25 | Disposition: A | Discharge status: Home / Self Care | Payer: BC Managed Care – PPO | Source: Ambulatory Visit | Attending: Internal Medicine | Admitting: Internal Medicine

## 2010-03-25 ENCOUNTER — Other Ambulatory Visit: Payer: BC Managed Care – PPO

## 2010-03-25 ENCOUNTER — Other Ambulatory Visit: Payer: Self-pay | Admitting: Internal Medicine

## 2010-03-25 ENCOUNTER — Encounter (INDEPENDENT_AMBULATORY_CARE_PROVIDER_SITE_OTHER): Payer: Self-pay | Admitting: *Deleted

## 2010-03-25 DIAGNOSIS — Z0000 Encounter for general adult medical examination without abnormal findings: Secondary | ICD-10-CM

## 2010-03-25 DIAGNOSIS — R079 Chest pain, unspecified: Secondary | ICD-10-CM

## 2010-03-25 DIAGNOSIS — H9202 Otalgia, left ear: Secondary | ICD-10-CM | POA: Insufficient documentation

## 2010-03-25 DIAGNOSIS — R0989 Other specified symptoms and signs involving the circulatory and respiratory systems: Secondary | ICD-10-CM

## 2010-03-25 DIAGNOSIS — R0609 Other forms of dyspnea: Secondary | ICD-10-CM | POA: Insufficient documentation

## 2010-03-25 LAB — HEPATIC FUNCTION PANEL
AST: 24 U/L (ref 0–37)
Albumin: 4.2 g/dL (ref 3.5–5.2)
Total Bilirubin: 0.6 mg/dL (ref 0.3–1.2)
Total Protein: 7 g/dL (ref 6.0–8.3)

## 2010-03-25 LAB — CBC WITH DIFFERENTIAL/PLATELET
Basophils Absolute: 0 10*3/uL (ref 0.0–0.1)
Basophils Relative: 0.6 % (ref 0.0–3.0)
Eosinophils Absolute: 0.1 10*3/uL (ref 0.0–0.7)
Hemoglobin: 14.6 g/dL (ref 13.0–17.0)
Lymphocytes Relative: 40.8 % (ref 12.0–46.0)
MCHC: 34.5 g/dL (ref 30.0–36.0)
MCV: 92.3 fl (ref 78.0–100.0)
Monocytes Absolute: 0.5 10*3/uL (ref 0.1–1.0)
Monocytes Relative: 9.7 % (ref 3.0–12.0)
Neutro Abs: 2.5 10*3/uL (ref 1.4–7.7)
Platelets: 246 10*3/uL (ref 150.0–400.0)
RBC: 4.59 Mil/uL (ref 4.22–5.81)
RDW: 12.5 % (ref 11.5–14.6)

## 2010-03-25 LAB — BASIC METABOLIC PANEL
CO2: 29 mEq/L (ref 19–32)
Calcium: 9.6 mg/dL (ref 8.4–10.5)
Chloride: 107 mEq/L (ref 96–112)
Creatinine, Ser: 0.6 mg/dL (ref 0.4–1.5)
Glucose, Bld: 99 mg/dL (ref 70–99)

## 2010-03-25 LAB — URINALYSIS
Ketones, ur: NEGATIVE
Leukocytes, UA: NEGATIVE
Specific Gravity, Urine: 1.015 (ref 1.000–1.030)
Total Protein, Urine: NEGATIVE
Urine Glucose: NEGATIVE
pH: 6.5 (ref 5.0–8.0)

## 2010-03-25 LAB — TSH: TSH: 1.53 u[IU]/mL (ref 0.35–5.50)

## 2010-03-25 LAB — LIPID PANEL
Cholesterol: 179 mg/dL (ref 0–200)
HDL: 63.5 mg/dL (ref >39.00)
Triglycerides: 54 mg/dL (ref 0.0–149.0)
VLDL: 10.8 mg/dL (ref 0.0–40.0)

## 2010-03-27 ENCOUNTER — Encounter (INDEPENDENT_AMBULATORY_CARE_PROVIDER_SITE_OTHER): Payer: Self-pay | Admitting: *Deleted

## 2010-04-02 NOTE — Letter (Addendum)
Summary: Primary Care Consult Scheduled Letter  [facility]  [address] [place], [place] [address]   Phone: [phone]  Fax: [phone]      03/27/2010 MRN: [id number]  Billy Morris [address] [place], [place]  [address]  [place]    Dear Mr. Marchia Bond,      We have scheduled an appointment for you.  At the recommendation of Dr.JOHN, we have scheduled you a consult with [facility] (DR. GRAPEY) on [facility] 6,2012 at 8:30 am .Their address is [address] [place] [address] [facility] The office phone number is [phone]. If this appointment day and time is not convenient for you, please feel free to call the office of the doctor you are being referred to at the number listed above and reschedule the appointment.     It is important for you to keep your scheduled appointments. We are here to make sure you are given good patient care. If you have questions or you have made changes to your appointment, please notify us at  [phone] ask for DEBRA.    Thank you,  Patient Care Coordinator [facility]

## 2010-04-02 NOTE — Assessment & Plan Note (Signed)
Summary: neck pain x4 mos   Vital Signs:  Patient profile:   40 year old male Height:      66 inches Weight:      189 pounds BMI:     30.62 O2 Sat:      98 % on Room air Temp:     97.9 degrees F oral Pulse rate:   71 / minute BP sitting:   122 / 72  (left arm) Cuff size:   regular  Vitals Entered By: Zella Ball Ewing CMA (AAMA) (March 25, 2010 10:10 AM)  O2 Flow:  Room air  CC: Neck and ear pain/RE   Primary Care Provider:  Oliver Barre, MD  CC:  Neck and ear pain/RE.  History of Present Illness: here for wellness - still some disocmfort to bialt ears, only short sharp, severe and intermittent to right that he can put up with; but left ear still full, hears the pulse in the ear, constant, mod to severe, with some pain as well in the left neck below the ear;;  tender to touch and sore to turn the head left and right; sudafed did help somewhat with the left ear pressure but overall still persits;  Did see [facility] on a weekend last wk - urged similar tx as before with sudafed, antihist with good compliance.  Symtpoms overall ongoing since prior to november steady without relief.  Fullness to left ear seems worse when off his allergy meds during thneis time, but reluctant to take since it seemed the neck soreness might be worse with taking the allergy meds, but better again with sudafed.  Also with intermittent vertigo , with last episode 2 days ago while just eating dinner at the table, lasted minutes and quite disquieting,  has not filled the prior rx for meclizine.  last saw ENT dec 2011- to cont meds as is, including the PPI.  Overall good compliance with meds, and good tolerability with the PPI but some somewhat varaible complaicne with the rest, except for even the PPI on the weekends.  Has seen allergist Dr Sharyn Lull, but not for several yrs, did not suggest shots at that time - was being tx with advair, flonase and 3 other meds, but was self employed at the time and could not afford all the  meds.  No recent fever, but did think he had a URI last wk for several days with a flare of ST and sinus congestion that seemed to improve from that respect.  Also states he is concerned about his thyroid (has FH thyroid issues) and also his vision since he depeneds on his binocular vision for work - has had some presbyopia symtpoms in the past few motnhs as well.  Denies worsening depressive symptoms, suicidal ideation, or panic, though has ongoing anxiety.  Has tried mucinex and did not get improvement so stopped  Problems Prior to Update: 1)  Dyspnea/shortness of Breath  (ICD-786.09) 2)  Chest Pain  (ICD-786.50) 3)  Ear Pain, Left  (ICD-388.70) 4)  Unspecified Eustachian Tube Disorder  (ICD-381.9) 5)  Otitis Media, Left  (ICD-382.9) 6)  Sinusitis- Acute-nos  (ICD-461.9) 7)  Sore Throat  (ICD-462) 8)  Cough  (ICD-786.2) 9)  Change in Bowels  (ICD-787.99) 10)  Nonspecific Abn Finding Rad & Oth Exam Gi Tract  (ICD-793.4) 11)  Epigastric Pain  (ICD-789.06) 12)  Anal Fissure  (ICD-565.0) 13)  Hematochezia  (ICD-578.1) 14)  Hyperlipidemia  (ICD-272.4) 15)  Abdominal Pain, Generalized  (ICD-789.07) 16)  Preventive Health  Care  (ICD-V70.0) 17)  Low Back Pain  (ICD-724.2) 18)  Gerd  (ICD-530.81) 19)  Anxiety  (ICD-300.00) 20)  Loss, Hearing Nos  (ICD-389.9) 21)  Asthma  (ICD-493.90) 22)  Allergic Rhinitis  (ICD-477.9)  Medications Prior to Update: 1)  Lansoprazole 30 Mg Cpdr (Lansoprazole) .Marland Kitchen.. 1 By Mouth Two Times A Day 2)  Levocetirizine Dihydrochloride 5 Mg Tabs (Levocetirizine Dihydrochloride) .Marland Kitchen.. 1po Once Daily For Allergies 3)  Fluticasone Propionate 50 Mcg/act Susp (Fluticasone Propionate) .... 2 Spray/side Once Daily 4)  Proair Hfa 108 (90 Base) Mcg/act Aers (Albuterol Sulfate) .... 2 Puff Four Times Per Day As Needed Shortness of Breath 5)  Qvar 80 Mcg/act Aers (Beclomethasone Dipropionate) .Marland Kitchen.. 1 Puff Two Times A Day 6)  Azithromycin 250 Mg Tabs (Azithromycin) .... 2po Qd For 1  Day, Then 1po Qd For 4days, Then Stop 7)  Meclizine Hcl 12.5 Mg Tabs (Meclizine Hcl) .Marland Kitchen.. 1-2 By Mouth Qid As Needed Dizziness  Current Medications (verified): 1)  Lansoprazole 30 Mg Cpdr (Lansoprazole) .Marland Kitchen.. 1 By Mouth Two Times A Day 2)  Allergy Relief D 180-240 Mg Xr24h-Tab (Fexofenadine-Pseudoep .Marland Kitchen.. 1 By Mouth Once Daily As Needed 3)  Fluticasone Propionate 50 Mcg/act Susp (Fluticasone Propionate) .... 2 Spray/side Once Daily 4)  Proair Hfa 108 (90 Base) Mcg/act Aers (Albuterol Sulfate) .... 2 Puff Four Times Per Day As Needed Shortness of Breath 5)  Qvar 80 Mcg/act Aers (Beclomethasone Dipropionate) .Marland Kitchen.. 1 Puff Two Times A Day 6)  Azithromycin 250 Mg Tabs (Azithromycin) .... 2po Qd For 1 Day, Then 1po Qd For 4days, Then Stop 7)  Meclizine Hcl 12.5 Mg Tabs (Meclizine Hcl) .Marland Kitchen.. 1-2 By Mouth Qid As Needed Dizziness 8)  Singulair 10 Mg Tabs (Montelukast Sodium) .Marland Kitchen.. 1po Once Daily  Allergies (verified): 1)  ! Asa 2)  ! Cipro 3)  ! Sulfa 4)  ! 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  The patient denies anorexia, fever, weight loss, weight gain, vision loss, hoarseness, syncope, peripheral edema, prolonged cough, headaches, hemoptysis, abdominal pain, melena, hematochezia, severe indigestion/heartburn, hematuria, muscle weakness,  suspicious skin lesions, transient blindness, difficulty walking, depression, unusual weight change, abnormal bleeding, enlarged lymph nodes, and angioedema.         all otherwise negative per pt -  but breathing "just doenst feel right" with vague chest discomfort and tighness, nonpleuritic, nonexertional, intermittent for seconds over the past yr  Physical Exam  General:  alert and well-developed.   Head:  normocephalic and atraumatic.   Eyes:  vision grossly intact, pupils equal, and pupils round.   Ears:  bilat tm's mild erythema, slight bulging on the left, canals clear,  Nose:  nasal dischargemucosal pallor and mucosal edema.   Mouth:  pharyngeal erythema and fair dentition.   Neck:  supple, full ROM, and no masses.  , nontender to palpate Lungs:  Normal respiratory effort, chest expands symmetrically. Lungs are clear to  auscultation, no crackles or wheezes. Heart:  Normal rate and regular rhythm. S1 and S2 normal without gallop, murmur, click, rub or other extra sounds. Abdomen:  soft, non-tender, and normal bowel sounds.   Msk:  no joint tenderness and no joint swelling.   Extremities:  no edema, no erythema  Neurologic:  strength normal in all extremities and gait normal.   Skin:  color normal and no rashes.   Psych:  moderately anxious.     Impression & Recommendations:  Problem # 1:  Preventive Health Care (ICD-V70.0)  Overall doing well, age appropriate education and counseling updated, referral for preventive services and immunizations addressed, dietary counseling and smoking status adressed , most recent labs reviewed, ecg reviewed I have personally reviewed and have noted 1.The patient's medical and social history 2.Their use of alcohol, tobacco or illicit drugs 3.Their current medications and supplements 4. Functional ability including ADL's, fall risk, home safety risk, hearing & visual impairment 5.Diet and physical activities 6.Evidence for depression or mood  disorders The patients weight, height, BMI  have been recorded in the chart I have made referrals, counseling and provided education to the patient based review of the above .   Pt also desires vasectomy - for urology referral  Orders: Urology Referral (Urology) TLB-BMP (Basic Metabolic Panel-BMET) (80048-METABOL) TLB-CBC Platelet - w/Differential (85025-CBCD) TLB-Hepatic/Liver Function Pnl (80076-HEPATIC) TLB-Lipid Panel (80061-LIPID) TLB-TSH (Thyroid Stimulating Hormone) (84443-TSH) TLB-Udip ONLY (81003-UDIP)  Problem # 2:  EAR PAIN, LEFT (ICD-388.70)  His updated medication list for this problem includes:    Azithromycin 250 Mg Tabs (Azithromycin) .Marland Kitchen... 2po qd for 1 day, then 1po qd for 4days, then stop with referred pain to the left neck;  to work on eustachian valve symtpoms with change xyzal to allegraD, add singulair, and consider allergy re-evaluation;  dont think he needs ENT f/u at this point though wife (not here) suggested an ENT at [facility]   Problem # 3:  CHEST PAIN (ICD-786.50)  atypical - for cxr  today, ECG reviewed with pt - sinus without acute change  Orders: EKG w/ Interpretation (93000) T-2 View CXR, Same Day (71020.5TC)  Problem # 4:  DYSPNEA/SHORTNESS OF BREATH (ICD-786.09)  His updated medication list for this problem includes:    Proair Hfa 108 (90 Base) Mcg/act Aers (Albuterol sulfate) .Marland Kitchen... 2 puff four times per day as needed shortness of breath    Qvar 80 Mcg/act Aers (Beclomethasone dipropionate) .Marland Kitchen... 1 puff two times a day    Singulair 10 Mg Tabs (Montelukast sodium) .Marland Kitchen... 1po once daily Continue all previous medications as before this visit , for cxr, PFT's and refer pulm per pt request  Orders: Misc. Referral (Misc. Ref) Pulmonary Referral (Pulmonary)  Complete Medication List: 1)  Lansoprazole 30 Mg Cpdr (Lansoprazole) .Marland Kitchen.. 1 by mouth two times a day 2)  Allergy Relief D 180-240 Mg Xr24h-tab (fexofenadine-pseudoep  .Marland Kitchen.. 1 by mouth once  daily as needed 3)  Fluticasone Propionate 50 Mcg/act Susp (Fluticasone propionate) .... 2 spray/side once daily 4)  Proair Hfa 108 (90 Base) Mcg/act Aers (Albuterol sulfate) .... 2 puff four times per day as needed shortness of breath 5)  Qvar 80 Mcg/act Aers (Beclomethasone dipropionate) .Marland Kitchen.. 1 puff two times a day 6)  Azithromycin 250 Mg Tabs (Azithromycin) .... 2po qd for 1 day, then 1po qd for 4days, then stop 7)  Meclizine Hcl 12.5 Mg Tabs (Meclizine hcl) .Marland Kitchen.. 1-2 by mouth qid as needed dizziness 8)  Singulair 10 Mg Tabs (Montelukast sodium) .Marland Kitchen.. 1po  once daily  Patient Instructions: 1)  Please take all new medications as prescribed - the allegra D (generic), and the singulair 2)  stop the generic xyzal (levocetirizine) 3)  Please go to Radiology in the basement level for your X-Ray today  4)  Please go to the Lab in the basement for your blood and/or urine tests today 5)  Please call the number on the [facility] for results of your testing  6)  You will be contacted about the referral(s) to: Lung testing (PFT's), and pulmonary referral, and urology 7)  please call if you would like referral to locall allergist 8)  Please schedule a follow-up appointment in 1 year, or sooner if needed Prescriptions: SINGULAIR 10 MG TABS (MONTELUKAST SODIUM) 1po once daily  #30 x 11   Entered and Authorized by:   Corwin Levins MD   Signed by:   Corwin Levins MD on 03/25/2010   Method used:   Print then Give to Patient   RxID:   [id number] ALLERGY RELIEF D 180-240 MG XR24H-TAB (FEXOFENADINE-PSEUDOEP 1 by mouth once daily as needed  #30 x 5   Entered and Authorized by:   Corwin Levins MD   Signed by:   Corwin Levins MD on 03/25/2010   Method used:   Print then Give to Patient   RxID:   [id number]    Orders Added: 1)  EKG w/ Interpretation [93000] 2)  Misc. Referral [Misc. Ref] 3)  Pulmonary Referral [Pulmonary] 4)  Urology Referral [Urology] 5)  TLB-BMP (Basic Metabolic Panel-BMET)  [80048-METABOL] 6)  TLB-CBC Platelet - w/Differential [85025-CBCD] 7)  TLB-Hepatic/Liver Function Pnl [80076-HEPATIC] 8)  TLB-Lipid Panel [80061-LIPID] 9)  TLB-TSH (Thyroid Stimulating Hormone) [84443-TSH] 10)  TLB-Udip ONLY [81003-UDIP] 11)  T-2 View CXR, Same Day [71020.5TC] 12)  Est. Patient 18-39 years [84696]

## 2010-04-07 ENCOUNTER — Encounter (INDEPENDENT_AMBULATORY_CARE_PROVIDER_SITE_OTHER): Payer: BC Managed Care – PPO

## 2010-04-07 ENCOUNTER — Encounter: Payer: Self-pay | Admitting: Internal Medicine

## 2010-04-07 DIAGNOSIS — R059 Cough, unspecified: Secondary | ICD-10-CM

## 2010-04-07 DIAGNOSIS — J45909 Unspecified asthma, uncomplicated: Secondary | ICD-10-CM

## 2010-04-07 DIAGNOSIS — R0609 Other forms of dyspnea: Secondary | ICD-10-CM

## 2010-04-07 DIAGNOSIS — R05 Cough: Secondary | ICD-10-CM

## 2010-04-09 ENCOUNTER — Encounter: Payer: Self-pay | Admitting: Internal Medicine

## 2010-04-11 ENCOUNTER — Encounter: Payer: Self-pay | Admitting: Internal Medicine

## 2010-04-11 ENCOUNTER — Telehealth: Payer: Self-pay | Admitting: Internal Medicine

## 2010-04-11 ENCOUNTER — Ambulatory Visit (INDEPENDENT_AMBULATORY_CARE_PROVIDER_SITE_OTHER): Payer: BC Managed Care – PPO | Admitting: Internal Medicine

## 2010-04-11 DIAGNOSIS — R0609 Other forms of dyspnea: Secondary | ICD-10-CM

## 2010-04-11 DIAGNOSIS — R0989 Other specified symptoms and signs involving the circulatory and respiratory systems: Secondary | ICD-10-CM

## 2010-04-15 ENCOUNTER — Encounter: Payer: Self-pay | Admitting: Internal Medicine

## 2010-04-15 ENCOUNTER — Ambulatory Visit (INDEPENDENT_AMBULATORY_CARE_PROVIDER_SITE_OTHER): Payer: BC Managed Care – PPO

## 2010-04-15 DIAGNOSIS — Z23 Encounter for immunization: Secondary | ICD-10-CM

## 2010-04-17 NOTE — Progress Notes (Signed)
Summary: OV NOW  Phone Note Call from Patient Call back at Work Phone [phone]   Summary of Call: Pt was folding clothes - c/o tingling in his hands and felt his "heart" speed up for short time. No problems now. SPoke w/pt and he has no CP, or SOB. Pt being seen now Initial call taken by: Lamar Sprinkles, CMA,  April 11, 2010 2:41 PM

## 2010-04-17 NOTE — Assessment & Plan Note (Signed)
Summary: pft charges   Allergies: 1)  ! Asa 2)  ! Cipro 3)  ! Sulfa 4)  ! Penicillin   Other Orders: Carbon Monoxide diffusing w/capacity (04540) Lung Volumes/Gas dilution or washout (98119) Spirometry (Pre & Post) [phone])

## 2010-04-17 NOTE — Assessment & Plan Note (Signed)
Summary: per phone note/tingling hands/per [email or web address]   Vital Signs:  Patient profile:   40 year old male Height:      66 inches Weight:      183.25 pounds BMI:     29.68 O2 Sat:      98 % on Room air Temp:     98.7 degrees F oral Pulse rate:   86 / minute BP sitting:   122 / 70  (left arm) Cuff size:   regular  Vitals Entered By: Zella Ball Ewing CMA Duncan Dull) (April 11, 2010 2:49 PM)  O2 Flow:  Room air CC: Both hands and arms tingling, dizzy, heart racing after taking medication/RE   Primary Care Provider:  Oliver Barre, MD  CC:  Both hands and arms tingling, dizzy, and heart racing after taking medication/RE.  History of Present Illness: here to f/u approx 4  hours after first inhalation of spiriva stating "I hate taking medicine," and c/o sudden onset bilat hand paresthesias, heart racing, dizziness and chills adn "chest not feeling right."  Some worse to sit down, nothing made better,  but seemed to resolved over several minutes, but did "flare up" with sitting down soon after.  Called wife , then called here to be seen, now back to baseline but wanted to be checked.  Pt denies CP, worsening sob, doe, wheezing, orthopnea, pnd, worsening LE edema, palps, dizziness or syncope  Pt denies new neuro symptoms such as headache, facial or extremity weakness  Pt denies polydipsia, polyuria  Overall good compliance with meds, trying to follow low chol  diet, wt stable recently.    Problems Prior to Update: 1)  Dyspnea/shortness of Breath  (ICD-786.09) 2)  Chest Pain  (ICD-786.50) 3)  Ear Pain, Left  (ICD-388.70) 4)  Unspecified Eustachian Tube Disorder  (ICD-381.9) 5)  Otitis Media, Left  (ICD-382.9) 6)  Sinusitis- Acute-nos  (ICD-461.9) 7)  Sore Throat  (ICD-462) 8)  Cough  (ICD-786.2) 9)  Change in Bowels  (ICD-787.99) 10)  Nonspecific Abn Finding Rad & Oth Exam Gi Tract  (ICD-793.4) 11)  Epigastric Pain  (ICD-789.06) 12)  Anal Fissure  (ICD-565.0) 13)  Hematochezia  (ICD-578.1) 14)   Hyperlipidemia  (ICD-272.4) 15)  Abdominal Pain, Generalized  (ICD-789.07) 16)  Preventive Health Care  (ICD-V70.0) 17)  Low Back Pain  (ICD-724.2) 18)  Gerd  (ICD-530.81) 19)  Anxiety  (ICD-300.00) 20)  Loss, Hearing Nos  (ICD-389.9) 21)  Asthma  (ICD-493.90) 22)  Allergic Rhinitis  (ICD-477.9)  Medications Prior to Update: 1)  Lansoprazole 30 Mg Cpdr (Lansoprazole) .Marland Kitchen.. 1 By Mouth Two Times A Day 2)  Allergy Relief D 180-240 Mg Xr24h-Tab (Fexofenadine-Pseudoep .Marland Kitchen.. 1 By Mouth Once Daily As Needed 3)  Fluticasone Propionate 50 Mcg/act Susp (Fluticasone Propionate) .... 2 Spray/side Once Daily 4)  Proair Hfa 108 (90 Base) Mcg/act Aers (Albuterol Sulfate) .... 2 Puff Four Times Per Day As Needed Shortness of Breath 5)  Qvar 80 Mcg/act Aers (Beclomethasone Dipropionate) .Marland Kitchen.. 1 Puff Two Times A Day 6)  Azithromycin 250 Mg Tabs (Azithromycin) .... 2po Qd For 1 Day, Then 1po Qd For 4days, Then Stop 7)  Meclizine Hcl 12.5 Mg Tabs (Meclizine Hcl) .Marland Kitchen.. 1-2 By Mouth Qid As Needed Dizziness 8)  Singulair 10 Mg Tabs (Montelukast Sodium) .Marland Kitchen.. 1po Once Daily 9)  Spiriva Handihaler 18 Mcg Caps (Tiotropium Bromide Monohydrate) .Marland Kitchen.. 1 Puff Once Daily  Current Medications (verified): 1)  Lansoprazole 30 Mg Cpdr (Lansoprazole) .Marland Kitchen.. 1 By Mouth Two Times A Day 2)  Allergy Relief D 180-240 Mg Xr24h-Tab (Fexofenadine-Pseudoep .Marland Kitchen.. 1 By Mouth Once Daily As Needed 3)  Fluticasone Propionate 50 Mcg/act Susp (Fluticasone Propionate) .... 2 Spray/side Once Daily 4)  Proair Hfa 108 (90 Base) Mcg/act Aers (Albuterol Sulfate) .... 2 Puff Four Times Per Day As Needed Shortness of Breath 5)  Qvar 80 Mcg/act Aers (Beclomethasone Dipropionate) .Marland Kitchen.. 1 Puff Two Times A Day 6)  Meclizine Hcl 12.5 Mg Tabs (Meclizine Hcl) .Marland Kitchen.. 1-2 By Mouth Qid As Needed Dizziness 7)  Singulair 10 Mg Tabs (Montelukast Sodium) .Marland Kitchen.. 1po Once Daily  Allergies (verified): 1)  ! Asa 2)  ! Cipro 3)  ! Sulfa 4)  ! Penicillin 5)  *  Spiriva  Past History:  Past Medical History: Last updated: 11/12/2009 Hearing Loss - chronic right Allergic rhinitis Asthma Anxiety GERD Low back pain hx of h pylori tx Hyperlipidemia Anal Fissure  Past Surgical History: Last updated: 10/06/2006 Denies surgical history  Social History: Last updated: 11/12/2009 Married 2 children work - Location manager Never Smoked Alcohol use-no Drug use-no Daily Caffeine Use  Risk Factors: Smoking Status: never (03/22/2008)  Review of Systems       all otherwise negative per pt -    Physical Exam  General:  alert and well-developed.   Head:  normocephalic and atraumatic.   Eyes:  vision grossly intact, pupils equal, and pupils round.   Ears:  R ear normal and L ear normal.   Nose:  no external deformity and no nasal discharge.   Mouth:  no gingival abnormalities and pharynx pink and moist.   Neck:  supple and no masses.   Lungs:  normal respiratory effort and normal breath sounds.   Heart:  normal rate and regular rhythm.   Msk:  no joint tenderness and no joint swelling.   Extremities:  no edema, no erythema  Skin:  color normal, no rashes, and no edema.   Psych:  not depressed appearing and moderately anxious.     Impression & Recommendations:  Problem # 1:  DYSPNEA/SHORTNESS OF BREATH (ICD-786.09)  The following medications were removed from the medication list:    Spiriva Handihaler 18 Mcg Caps (Tiotropium bromide monohydrate) .Marland Kitchen... 1 puff once daily His updated medication list for this problem includes:    Proair Hfa 108 (90 Base) Mcg/act Aers (Albuterol sulfate) .Marland Kitchen... 2 puff four times per day as needed shortness of breath    Qvar 80 Mcg/act Aers (Beclomethasone dipropionate) .Marland Kitchen... 1 puff two times a day    Singulair 10 Mg Tabs (Montelukast sodium) .Marland Kitchen... 1po once daily spiriva trial with symptoms as per HPI now resolved - ? psych overalay,  to stop med, cont other inhalers,  f/u pulm as planned later this  month  Complete Medication List: 1)  Lansoprazole 30 Mg Cpdr (Lansoprazole) .Marland Kitchen.. 1 by mouth two times a day 2)  Allergy Relief D 180-240 Mg Xr24h-tab (fexofenadine-pseudoep  .Marland Kitchen.. 1 by mouth once daily as needed 3)  Fluticasone Propionate 50 Mcg/act Susp (Fluticasone propionate) .... 2 spray/side once daily 4)  Proair Hfa 108 (90 Base) Mcg/act Aers (Albuterol sulfate) .... 2 puff four times per day as needed shortness of breath 5)  Qvar 80 Mcg/act Aers (Beclomethasone dipropionate) .Marland Kitchen.. 1 puff two times a day 6)  Meclizine Hcl 12.5 Mg Tabs (Meclizine hcl) .Marland Kitchen.. 1-2 by mouth qid as needed dizziness 7)  Singulair 10 Mg Tabs (Montelukast sodium) .Marland Kitchen.. 1po once daily  Patient Instructions: 1)  stop the spiriva 2)  Continue all previous  medications as before this visit , including the inhalers you had 3)  Please keep your appt with pulmonary as planned 4)  Please schedule a follow-up appointment as needed.   Orders Added: 1)  Est. Patient Level III [81191]

## 2010-04-22 NOTE — Assessment & Plan Note (Signed)
Summary: PER ROBIN SCHED--T DAP---JWJ--STC   Nurse Visit   Allergies: 1)  ! Asa 2)  ! Cipro 3)  ! Sulfa 4)  ! Penicillin 5)  * Spiriva  Immunizations Administered:  Tetanus Vaccine:    Vaccine Type: Tdap    Site: right deltoid    Mfr: Sanofi Pasteur    Dose: 0.5 ml    Given by: Burnard Leigh CMA(AAMA)    Exp. Date: 03/13/2011    Lot #: [id number]    VIS given: 12/28/07 version given April 15, 2010.  Orders Added: 1)  Tdap => 51yrs IM [90715] 2)  Admin 1st Vaccine [34742]

## 2010-04-24 ENCOUNTER — Encounter: Payer: Self-pay | Admitting: Pulmonary Disease

## 2010-04-24 ENCOUNTER — Institutional Professional Consult (permissible substitution) (INDEPENDENT_AMBULATORY_CARE_PROVIDER_SITE_OTHER): Payer: BC Managed Care – PPO | Admitting: Pulmonary Disease

## 2010-04-24 DIAGNOSIS — J45909 Unspecified asthma, uncomplicated: Secondary | ICD-10-CM

## 2010-05-08 NOTE — Assessment & Plan Note (Signed)
Summary: consult for management of asthma    Visit Type:  Initial Consult Copy to:  Billy Morris Primary Provider/Referring Provider:  Oliver Barre, MD  CC:  Pulmonary Consult.  Pt c/o increased sob x last 6 months.  Pt states he has seen an allergist, Billy Morris, and approx 10 years ago. Billy Morris  History of Present Illness: the pt is a 40y/o male who I have been asked to see for management of asthma.  He has had asthma dating back to childhood, and in the past has been treated with advair with good control.  He discontinued his medication on his own some time ago, and was restarted on an ICS regimen with qvar with other meds added recently.  He feels he continues to have pulmonary symptoms, and specifically notes a restriction to airflow involving his left chest.  He has been getting more active recently, and feels his sob has been improving (notes that he feels better after a mile of exercise than when he first starts).  He denies any cough or mucus, and feels he has good exercise tolerance.  He hardly ever uses his rescue inhaler.  He tells me that he also has had allergy testing in the past, with + to cats, grass, trees.  He has had a recent cxr that was unremarkable, and recent pfts show mild to moderate airflow obstruction with really no response to bronchodilator.  Of note, the pt tells me that he works in an environment with metal working fluids, which is known to cause hypersensitivity pneumonitis in rare circumstances.    Current Medications (verified): 1)  Lansoprazole 30 Mg Cpdr (Lansoprazole) .Billy Morris.. 1 By Mouth Two Times A Day 2)  Fluticasone Propionate 50 Mcg/act Susp (Fluticasone Propionate) .... 2 Spray/side Once Daily 3)  Proair Hfa 108 (90 Base) Mcg/act Aers (Albuterol Sulfate) .... 2 Puff Four Times Per Day As Needed Shortness of Breath 4)  Qvar 80 Mcg/act Aers (Beclomethasone Dipropionate) .Billy Morris.. 1 Puff Two Times A Day 5)  Singulair 10 Mg Tabs (Montelukast Sodium) .Billy Morris.. 1po Once Daily 6)  Xyzal  5 Mg Tabs (Levocetirizine Dihydrochloride) .... Take 1 Tablet By Mouth Once A Day 7)  Pseudoephedrine Hcl 120 Mg Xr12h-Tab (Pseudoephedrine Hcl) .... Take 1 Tablet By Mouth Two Times A Day As Needed  Allergies (verified): 1)  ! Asa 2)  ! Cipro 3)  ! Sulfa 4)  ! Penicillin 5)  * Spiriva  Past History:  Past Medical History: Reviewed history from 11/12/2009 and no changes required. Hearing Loss - chronic right Allergic rhinitis Asthma Anxiety GERD Low back pain hx of h pylori tx Hyperlipidemia Anal Fissure  Past Surgical History: Reviewed history from 10/06/2006 and no changes required. Denies surgical history  Family History: Reviewed history from 03/22/2008 and no changes required. father with elevated chlolesterol mother with IBS 4 aunts with IBS emphysema: mgf (copd)  allergies: children asthma: son heart disease: pggm (chf)   cancer: mgf (prostate) heart attack:  mgm  Social History: Reviewed history from 11/12/2009 and no changes required. 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  The patient denies productive cough, non-productive cough, coughing up blood, chest pain, irregular heartbeats, acid heartburn, indigestion, loss of appetite, weight change, difficulty swallowing, tooth/dental problems,  headaches, sneezing, itching, depression, hand/feet swelling, joint stiffness or pain, rash, change in color of mucus, and fever.    Vital Signs:  Patient profile:   40 year old male Height:      66 inches Weight:      188 pounds BMI:     30.45 O2 Sat:      98 % on Room air Temp:     97.8 degrees F oral Pulse rate:   73 / minute BP sitting:   136 / 74  (left arm) Cuff size:    regular  Vitals Entered By: Arman Filter LPN (April 24, 2010 9:09 AM)  O2 Flow:  Room air CC: Pulmonary Consult.  Pt c/o increased sob x last 6 months.  Pt states he has seen an allergist, Billy Morris, approx 10 years ago.  Comments Medications reviewed with patient Arman Filter LPN  April 24, 2010 9:09 AM    Physical Exam  General:  wd male in nad  Eyes:  PERRLA and EOMI.  PERRLA and EOMI.   Nose:  patent without discharge, no purulence Mouth:  no exudates or lesions seen  Neck:  no jvd, tmg, LN Lungs:  totally clear to auscultation  Heart:  rrr, no mrg  Abdomen:  soft and nontender, bs+ Extremities:  no edema or cyanosis  pulses intact distally  Neurologic:  alert and oriented, moves all 4    Impression & Recommendations:  Problem # 1:  ASTHMA (ICD-493.90) the pt has known asthma with mild to moderate obstruction on recent pfts.  I suspect some of this is due to ongoing airway inflammation, and some may be fixed due to stopping treatment of his asthma in the past.  Nevertheless, will need to get qvar to a more therapeutic dose with 2 inhalations two times a day, and have asked him to stay on singulair for now for allergies.  If he continues to have issues, may need to look closer at this allergies and possibly the role his working environment may be playing.  I have had a long discussion with him about the pathophysiology of asthma, and the importance of medication compliance even when he feels well.    Medications Added to Medication List This Visit: 1)  Qvar 80 Mcg/act Aers (Beclomethasone dipropionate) .... Two  puffs twice daily 2)  Xyzal 5 Mg Tabs (Levocetirizine dihydrochloride) .... Take 1 tablet by mouth once a day 3)  Pseudoephedrine Hcl 120 Mg Xr12h-tab (Pseudoephedrine hcl) .... Take 1 tablet by mouth two times a day as needed  Other Orders: Consultation Level IV (16109)  Patient Instructions: 1)  increase qvar to 2 puffs am and pm everyday no matter what.  keep  mouth rinsed well.   2)  please call if you feel your symptoms are not totally controlled, or if having to use rescue inhaler more than 2 times a week. 3)  followup with me in 4mos.  Prescriptions: QVAR 80 MCG/ACT  AERS (BECLOMETHASONE DIPROPIONATE) Two  puffs twice daily  #1 x 6   Entered by:   Arman Filter LPN   Authorized by:   Barbaraann Share MD   Signed by:   Barbaraann Share MD on 04/24/2010   Method used:   Electronically to        [facility]  [phone]* (retail)       [address]       [place], [place]  [address]  Ph: [phone] or [phone]       Fax: [phone]   RxID:   [id number]

## 2010-06-23 ENCOUNTER — Other Ambulatory Visit: Payer: Self-pay | Admitting: Otolaryngology

## 2010-06-23 DIAGNOSIS — K219 Gastro-esophageal reflux disease without esophagitis: Secondary | ICD-10-CM

## 2010-06-23 DIAGNOSIS — R070 Pain in throat: Secondary | ICD-10-CM

## 2010-06-27 NOTE — Assessment & Plan Note (Signed)
[facility]                         GASTROENTEROLOGY OFFICE NOTE   NAME:Billy Morris, Billy Morris                          MRN:          [id number]  DATE:03/12/2006                            DOB:          [birth year]    REFERRING PHYSICIAN:  Corwin Levins, MD   REASON FOR CONSULTATION:  Abdominal discomfort, bloating, and change in  bowel habits.   HISTORY OF PRESENT ILLNESS:  This is a 40 year old white male with  history of allergies, asthma, and reflux. He is referred through the  courtesy of Dr. Jonny Ruiz regarding abdominal complaints and change in bowel  habits. The patient was evaluated previously for abdominal pain in  September 2006. See that dictation for details. He subsequently  underwent upper endoscopy. This was entirely normal. Biopsies for  Helicobacter pylori were negative. He was treated with omeprazole and  Levsin sublingual as needed. He reports being in his usual state of good  health until about three weeks ago when after initiating a diet and  eating a salad, he began to notice some abdominal discomfort associated  with tenderness in the left upper quadrant and right lower quadrant with  some loose bowels. His bowels then were alternating between loose and  constipated. Increased bloating and upper abdominal discomfort as well.  He initially started the diet in order to lose weight. He denies nausea,  vomiting, fever, melena or hematochezia. Does tend to be anxious and  concerned that his symptoms might represent serious condition.  Fortunately, over the past week his symptoms have improved  significantly. He now describes bowel habits as regular. He feels a mild  bloating. He did have an episode of pain last weekend in the mid  abdomen, but, otherwise, doing okay.   MEDICATIONS:  He is on no medications.   ALLERGIES:  He is allergic to SULFA, ASPIRIN, and CIPRO.   PHYSICAL EXAMINATION:  GENERAL:  A well-appearing male in no acute  distress.  VITAL SIGNS:  Blood pressure 120/64, heart rate 80, weight 171.2 pounds.  HEENT:  Sclerae anicteric, conjunctivae pink, oral mucosa intact.  LYMPHADENOPATHY:  No adenopathy.  LUNGS:  Clear.  HEART:  Regular.  ABDOMEN:  Soft without tenderness, masses or hernia. Good bowel sounds  heard.   IMPRESSION:  Recent problems with alternating bowel habits, bloating,  abdominal discomfort, nonspecific. He may have a tendency toward  irritable bowel. However, currently well.   RECOMMENDATIONS:  1. I will obtain abdominal ultrasound to rule out occult gallbladder      disease though unlikely.  2. Prescribe Levsin sublingual to be used as needed for recurrent      discomfort.  3. Followup p.r.n.     Wilhemina Bonito. Marina Goodell, MD  Electronically Signed    JNP/MedQ  DD: 03/12/2006  DT: 03/12/2006  Job #: [id number]   cc:   Corwin Levins, MD

## 2010-06-30 ENCOUNTER — Ambulatory Visit
Admission: RE | Admit: 2010-06-30 | Discharge: 2010-06-30 | Disposition: A | Discharge status: Home / Self Care | Payer: BC Managed Care – PPO | Source: Ambulatory Visit | Attending: Otolaryngology | Admitting: Otolaryngology

## 2010-06-30 DIAGNOSIS — K219 Gastro-esophageal reflux disease without esophagitis: Secondary | ICD-10-CM

## 2010-06-30 DIAGNOSIS — R070 Pain in throat: Secondary | ICD-10-CM

## 2010-08-25 ENCOUNTER — Encounter: Payer: Self-pay | Admitting: Pulmonary Disease

## 2010-08-26 ENCOUNTER — Encounter: Payer: Self-pay | Admitting: Pulmonary Disease

## 2010-08-26 ENCOUNTER — Ambulatory Visit (INDEPENDENT_AMBULATORY_CARE_PROVIDER_SITE_OTHER): Payer: BC Managed Care – PPO | Admitting: Pulmonary Disease

## 2010-08-26 VITALS — BP 114/82 | HR 73 | Temp 97.7°F | Ht 66.0 in | Wt 190.6 lb

## 2010-08-26 DIAGNOSIS — J45909 Unspecified asthma, uncomplicated: Secondary | ICD-10-CM

## 2010-08-26 NOTE — Patient Instructions (Signed)
Stay on qvar, meds for allergy and reflux Let me know if you feel your symptoms are not adequately controlled. followup with me in 6mos.

## 2010-08-26 NOTE — Assessment & Plan Note (Signed)
The pt is doing well from an asthma standpoint.  He rarely uses his rescue inhaler, and is trying to increase his exercise regimen.  He has minimal chest tightness that did not respond to albuterol, and he thinks is related to anxiety (or reflux).  Nothing to suggest angina.  I have asked him to continue on ICS alone for now, and will check spirometry next visit.

## 2010-08-26 NOTE — Progress Notes (Signed)
  Subjective:    Patient ID: Billy Morris, male    DOB: 09-06-1970, 40 y.o.   MRN: [id number]  HPI The pt comes in today for f/u of his known asthma.  He is using qvar compliantly, and feels that he is doing much better.  Rarely uses rescue inhaler, and continues on his allergy meds.  He will have breakthru reflux if he doesn't take his PPI.  He occasionally feels chest tightness, but does not get sob with it.  No change with albuterol rescue.     Review of Systems  Constitutional: Negative for fever and unexpected weight change.  HENT: Positive for sinus pressure. Negative for ear pain, nosebleeds, congestion, sore throat, rhinorrhea, sneezing, trouble swallowing, dental problem and postnasal drip.   Eyes: Negative for redness and itching.  Respiratory: Positive for chest tightness. Negative for cough, shortness of breath and wheezing.   Cardiovascular: Positive for palpitations. Negative for leg swelling.  Gastrointestinal: Negative for nausea and vomiting.  Genitourinary: Negative for dysuria.  Musculoskeletal: Negative for joint swelling.  Skin: Negative for rash.  Neurological: Negative for headaches.  Hematological: Does not bruise/bleed easily.  Psychiatric/Behavioral: Negative for dysphoric mood. The patient is not nervous/anxious.        Objective:   Physical Exam Wd male in nad Nares without purulence or discharge Chest with clear bs, no wheezing Cor with rrr LE without edema or cyanosis Alert, oriented, moves all 4        Assessment & Plan:

## 2010-12-02 ENCOUNTER — Encounter: Payer: Self-pay | Admitting: Endocrinology

## 2010-12-02 ENCOUNTER — Ambulatory Visit (INDEPENDENT_AMBULATORY_CARE_PROVIDER_SITE_OTHER): Payer: BC Managed Care – PPO | Admitting: Endocrinology

## 2010-12-02 VITALS — BP 108/62 | HR 96 | Temp 100.7°F | Ht 66.0 in | Wt 189.4 lb

## 2010-12-02 DIAGNOSIS — J069 Acute upper respiratory infection, unspecified: Secondary | ICD-10-CM

## 2010-12-02 MED ORDER — AZITHROMYCIN 500 MG PO TABS: 500.0000 mg | ORAL_TABLET | Freq: Every day | ORAL | Status: AC

## 2010-12-02 MED ORDER — ONDANSETRON HCL 4 MG PO TABS: 4.0000 mg | ORAL_TABLET | Freq: Three times a day (TID) | ORAL | Status: AC | PRN

## 2010-12-02 NOTE — Patient Instructions (Addendum)
i have sent 2 prescriptions to your pharmacy: (for an antibiotic, and anti-nausea medication).   You should take tylenol, and drink plenty of fluids, until you feel better.  I hope you feel better soon.  If you don't feel better by next week, please call dr Jonny Ruiz.

## 2010-12-02 NOTE — Progress Notes (Signed)
Subjective:    Patient ID: Billy Morris, male    DOB: [birth year], 40 y.o.   MRN: [id number]  HPI Pt states a few days of moderate pain at both ears, and assoc bifrontal headache. Past Medical History  Diagnosis Date  . Hearing loss     chronic right  . Allergic rhinitis   . Asthma   . Anxiety   . GERD (gastroesophageal reflux disease)   . LBP (low back pain)   . H. pylori infection   . Hyperlipidemia   . Anal fissure     No past surgical history on file.  History   Social History  . Marital Status: Married    Spouse Name: N/A    Number of Children: 2  . Years of Education: N/A   Occupational History  . machine operator    Social History Main Topics  . Smoking status: Never Smoker   . Smokeless tobacco: Not on file  . Alcohol Use: Not on file  . Drug Use: Not on file  . Sexually Active: Not on file   Other Topics Concern  . Not on file   Social History Narrative  . No narrative on file    Current Outpatient Prescriptions on File Prior to Visit  Medication Sig Dispense Refill  . albuterol (PROAIR HFA) 108 (90 BASE) MCG/ACT inhaler Inhale 2 puffs into the lungs every 6 (six) hours as needed.        . beclomethasone (QVAR) 80 MCG/ACT inhaler Inhale 2 puffs into the lungs 2 (two) times daily.        . fluticasone (FLONASE) 50 MCG/ACT nasal spray Place 2 sprays into the nose daily.        . lansoprazole (PREVACID) 30 MG capsule Take 30 mg by mouth 2 (two) times daily.        Marland Kitchen levocetirizine (XYZAL) 5 MG tablet Take 5 mg by mouth daily.        . montelukast (SINGULAIR) 10 MG tablet Take 10 mg by mouth daily.        . pseudoephedrine (SUDAFED) 120 MG 12 hr tablet Take 120 mg by mouth 2 (two) times daily as needed.          Allergies  Allergen Reactions  . Aspirin   . Ciprofloxacin     REACTION: stools bloody mucous  . Penicillins   . Sulfonamide Derivatives   . Tiotropium Bromide Monohydrate     REACTION: heart racing and paresthesias    Family History    Problem Relation Age of Onset  . Hyperlipidemia Father   . Other Mother     ibs  . Other Other     4 aunts with IBS  . COPD Maternal Grandfather   . Allergies Other     children  . Asthma Son   . Heart failure Other     paternal great grandmother  . Prostate cancer Maternal Grandfather   . Heart attack Maternal Grandmother     BP 108/62  Pulse 96  Temp(Src) 100.7 F (38.2 C) (Oral)  Ht 5\' 6"  (1.676 m)  Wt 189 lb 6 oz (85.9 kg)  BMI 30.57 kg/m2  SpO2 97%    Review of Systems He reports fever and myalgias.  He has nausea but no vomiting.     Objective:   Physical Exam VITAL SIGNS:  See vs page GENERAL: no distress head: no deformity eyes: no periorbital swelling, no proptosis.   external nose and ears are normal mouth:  no lesion seen. Both eac's are normal, and the tm's are red. NECK: There is no palpable thyroid enlargement.  No thyroid nodule is palpable.  No palpable lymphadenopathy at the anterior neck. LUNGS:  Clear to auscultation.         Assessment & Plan:  Glenford Peers, new

## 2010-12-04 ENCOUNTER — Other Ambulatory Visit: Payer: Self-pay | Admitting: Internal Medicine

## 2010-12-23 ENCOUNTER — Telehealth: Payer: Self-pay | Admitting: Pulmonary Disease

## 2010-12-23 NOTE — Telephone Encounter (Signed)
Spoke with pt. He is c/o increased DOE for the past few wks and has also had some left "lung pain" that come and goes with exertion. I have sched him to see TP tomorrow am at 11:15 am and advised seek emergency care sooner if needed. Pt verbalized understanding.

## 2010-12-24 ENCOUNTER — Other Ambulatory Visit: Payer: Self-pay | Admitting: Internal Medicine

## 2010-12-24 ENCOUNTER — Encounter: Payer: Self-pay | Admitting: Adult Health

## 2010-12-24 ENCOUNTER — Ambulatory Visit (INDEPENDENT_AMBULATORY_CARE_PROVIDER_SITE_OTHER): Payer: BC Managed Care – PPO | Admitting: Adult Health

## 2010-12-24 DIAGNOSIS — R079 Chest pain, unspecified: Secondary | ICD-10-CM | POA: Insufficient documentation

## 2010-12-24 DIAGNOSIS — J45909 Unspecified asthma, uncomplicated: Secondary | ICD-10-CM

## 2010-12-24 NOTE — Assessment & Plan Note (Signed)
Activity related  left sided chest pain -recommended pt have EKG today and refer to cardiology  Due to exertional chest pain , family hx  Pt refused to have EKG today and will not go to [facility].  Have explained his symptoms are concerning for cardiac and dangers however he refuse referral  Advised to call our office or ER if chest pain returns.  Will forward note to PCP

## 2010-12-24 NOTE — Progress Notes (Signed)
  Subjective:    Patient ID: Billy Morris, male    DOB: 09/25/1970, 40 y.o.   MRN: [id number]  HPI 40 yo male with known hx of asthma   12/24/2010 Acute OV  Complains of cold symptoms for last 2 weeks ago with cough and congestion /drainage and fever (103). He had chest tightness with cold symptoms. Symptoms resolved. Seen by PCP dx with viral illness.  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 No dyspnea or discolored mucus.    Review of Systems Constitutional:   No  weight loss, night sweats,  Fevers, chills, fatigue, or  lassitude.  HEENT:   No headaches,  Difficulty swallowing,  Tooth/dental problems, or  Sore throat,                No sneezing, itching, ear ache, nasal congestion, post nasal drip,   CV:  No  Orthopnea, PND, swelling in lower extremities, anasarca, dizziness, palpitations, syncope.   GI  No heartburn, indigestion, abdominal pain, nausea, vomiting, diarrhea, change in bowel habits, loss of appetite, bloody stools.   Resp: No shortness of breath with exertion or at rest.  No excess mucus, no productive cough,  No non-productive cough,  No coughing up of blood.  No change in color of mucus.  No wheezing.  No chest wall deformity  Skin: no rash or lesions.  GU: no dysuria, change in color of urine, no urgency or frequency.  No flank pain, no hematuria   MS:  No joint pain or swelling.  No decreased range of motion.  No back pain.  Psych:  No change in mood or affect. No depression or anxiety.  No memory loss.         Objective:   Physical Exam GEN: A/Ox3; pleasant , NAD, well nourished   HEENT:  [place]/AT,  EACs-clear, TMs-wnl, NOSE-clear, THROAT-clear, no lesions, no postnasal drip or exudate noted.   NECK:  Supple w/ fair ROM; no JVD; normal carotid impulses w/o bruits; no  thyromegaly or nodules palpated; no lymphadenopathy.  RESP  Clear  P & A; w/o, wheezes/ rales/ or rhonchi.no accessory muscle use, no dullness to percussion  CARD:  RRR, no m/r/g  , no peripheral edema, pulses intact, no cyanosis or clubbing.  GI:   Soft & nt; nml bowel sounds; no organomegaly or masses detected.  Musco: Warm bil, no deformities or joint swelling noted.   Neuro: alert, no focal deficits noted.    Skin: Warm, no lesions or rashes         Assessment & Plan:

## 2010-12-24 NOTE — Patient Instructions (Signed)
Continue on QVAR as directed.  We have recommended you be referred to Cardiology , if you change your mind please call back If chest pain returns , call immediatelly and/or go to ER/911.  follow up Dr. Shelle Iron as planned and As needed

## 2010-12-24 NOTE — Assessment & Plan Note (Signed)
Recent URI with mild flare now resolved.  Cont on QVAR and singulair.

## 2010-12-29 NOTE — Progress Notes (Signed)
Ov reviewed.  Agree with plan as outlined.  

## 2011-02-05 ENCOUNTER — Encounter: Payer: Self-pay | Admitting: Internal Medicine

## 2011-02-05 ENCOUNTER — Ambulatory Visit (INDEPENDENT_AMBULATORY_CARE_PROVIDER_SITE_OTHER): Payer: BC Managed Care – PPO | Admitting: Internal Medicine

## 2011-02-05 ENCOUNTER — Encounter: Payer: Self-pay | Admitting: *Deleted

## 2011-02-05 VITALS — BP 120/72 | HR 103 | Temp 103.0°F | Wt 186.0 lb

## 2011-02-05 DIAGNOSIS — J069 Acute upper respiratory infection, unspecified: Secondary | ICD-10-CM

## 2011-02-05 DIAGNOSIS — J111 Influenza due to unidentified influenza virus with other respiratory manifestations: Secondary | ICD-10-CM

## 2011-02-05 DIAGNOSIS — J180 Bronchopneumonia, unspecified organism: Secondary | ICD-10-CM

## 2011-02-05 MED ORDER — OSELTAMIVIR PHOSPHATE 75 MG PO CAPS: 75.0000 mg | ORAL_CAPSULE | Freq: Two times a day (BID) | ORAL | Status: AC

## 2011-02-05 MED ORDER — HYDROCOD POLST-CHLORPHEN POLST 10-8 MG/5ML PO LQCR: 5.0000 mL | Freq: Two times a day (BID) | ORAL | Status: DC | PRN

## 2011-02-05 MED ORDER — LEVOFLOXACIN 500 MG PO TABS: 500.0000 mg | ORAL_TABLET | Freq: Every day | ORAL | Status: AC

## 2011-02-05 NOTE — Patient Instructions (Signed)
It was good to see you today. And a flu twice a day for flu symptoms and Levaquin once a day for bronchitis symptoms - also Tussionex as needed for cough symptoms Your prescription(s) have been submitted to your pharmacy. Please take as directed and contact our office if you believe you are having problem(s) with the medication(s). Workup excuse provided as requested Alternate between ibuprofen and tylenol for aches, pain and fever symptoms as discussed Keep hydrated, rest and call if symptoms worse or unimproved

## 2011-02-05 NOTE — Progress Notes (Signed)
  Subjective:    HPI  complains of flu symptoms  Onset 5 days ago chest cold - wax/wane symptoms but much worse in last 24h Last 24h associated with rhinorrhea, sneezing, sore throat, headache and high fever Also severe myalgias, sinus pressure and mild chest congestion> green yellow sputum min relief with OTC meds Precipitated by sick contacts include flu (no flu shot)  Past Medical History  Diagnosis Date  . Hearing loss     chronic right  . Allergic rhinitis   . Asthma   . Anxiety   . GERD (gastroesophageal reflux disease)   . LBP (low back pain)   . H. pylori infection   . Hyperlipidemia   . Anal fissure     Review of Systems Constitutional: No night sweats, no unexpected weight change Pulmonary: No pleurisy or hemoptysis Cardiovascular: No chest pain or palpitations     Objective:   Physical Exam BP 120/72  Pulse 103  Temp(Src) 103 F (39.4 C) (Oral)  Wt 186 lb (84.369 kg)  SpO2 95% GEN: mildly ill appearing and audible head/chest congestion HENT: NCAT, mild sinus tenderness bilaterally, nares with clear discharge, oropharynx mod erythema, no exudate Eyes: Vision grossly intact, no conjunctivitis Lungs: LLL crackles but no wheeze, no increased work of breathing Cardiovascular: Regular rate and rhythm, no bilateral edema      Assessment & Plan:  Suspect influ with high fever, classic symptoms preceeding URI symptoms - now LLL bronchopna on exam Cough, postnasal drip related to above   tamiflu antibiotics for viral disease Empiric antibiotics prescribed due to LLL cracles for presumed bronchopna Prescription cough suppression - new prescriptions done Symptomatic care with Tylenol or Advil, hydration and rest -  salt gargle advised as needed Work note done

## 2011-02-17 ENCOUNTER — Other Ambulatory Visit: Payer: Self-pay | Admitting: Internal Medicine

## 2011-02-27 ENCOUNTER — Ambulatory Visit: Payer: BC Managed Care – PPO | Admitting: Pulmonary Disease

## 2011-03-06 ENCOUNTER — Ambulatory Visit (INDEPENDENT_AMBULATORY_CARE_PROVIDER_SITE_OTHER): Payer: BC Managed Care – PPO | Admitting: Pulmonary Disease

## 2011-03-06 ENCOUNTER — Encounter: Payer: Self-pay | Admitting: Pulmonary Disease

## 2011-03-06 VITALS — BP 108/70 | HR 80 | Temp 98.3°F | Ht 66.0 in | Wt 186.6 lb

## 2011-03-06 DIAGNOSIS — J45909 Unspecified asthma, uncomplicated: Secondary | ICD-10-CM

## 2011-03-06 NOTE — Assessment & Plan Note (Signed)
The patient is doing fairly well from a pulmonary standpoint after getting over flu like illnesses in November and December.  He feels that his breathing is getting back to baseline, and he has no wheezing on exam today.  I've asked him to continue his current medications, and to followup with me in 6 months.

## 2011-03-06 NOTE — Progress Notes (Signed)
  Subjective:    Patient ID: Billy Morris, male    DOB: Jan 27, 1971, 41 y.o.   MRN: [id number]  HPI Patient comes in today for follow up of his known asthma.  He had issues with viral-like illnesses in November and December of last year, but now is starting to get back to his baseline.  He is not having to use his rescue inhaler, and is continuing on his maintenance regimen.  He denies any significant dyspnea on exertion.  He is not having any purulent mucus.   Review of Systems  Constitutional: Negative for fever and unexpected weight change.  HENT: Positive for postnasal drip. Negative for ear pain, nosebleeds, congestion, sore throat, rhinorrhea, sneezing, trouble swallowing, dental problem and sinus pressure.   Eyes: Negative for redness and itching.  Respiratory: Positive for cough, shortness of breath and wheezing. Negative for chest tightness.   Cardiovascular: Negative for palpitations and leg swelling.  Gastrointestinal: Negative for nausea and vomiting.  Genitourinary: Negative for dysuria.  Musculoskeletal: Negative for joint swelling.  Skin: Negative for rash.  Neurological: Negative for headaches.  Hematological: Does not bruise/bleed easily.  Psychiatric/Behavioral: Negative for dysphoric mood. The patient is not nervous/anxious.        Objective:   Physical Exam Well-developed male in no acute distress Nose without purulence or discharge noted Chest totally clear to auscultation, no wheezes Cardiac exam with regular rate and rhythm Lower extremities without edema, no cyanosis noted Alert and oriented, moves all 4 extremities.       Assessment & Plan:

## 2011-03-06 NOTE — Patient Instructions (Signed)
No change in meds followup with me in 6mos, but call if not doing well.

## 2011-04-09 ENCOUNTER — Other Ambulatory Visit: Payer: Self-pay | Admitting: Internal Medicine

## 2011-06-16 ENCOUNTER — Other Ambulatory Visit: Payer: Self-pay | Admitting: Pulmonary Disease

## 2011-09-04 ENCOUNTER — Ambulatory Visit: Payer: BC Managed Care – PPO | Admitting: Pulmonary Disease

## 2011-09-08 ENCOUNTER — Ambulatory Visit (INDEPENDENT_AMBULATORY_CARE_PROVIDER_SITE_OTHER): Payer: BC Managed Care – PPO | Admitting: Pulmonary Disease

## 2011-09-08 ENCOUNTER — Encounter: Payer: Self-pay | Admitting: Pulmonary Disease

## 2011-09-08 VITALS — BP 120/84 | HR 72 | Temp 98.2°F | Ht 66.0 in | Wt 186.4 lb

## 2011-09-08 DIAGNOSIS — J45909 Unspecified asthma, uncomplicated: Secondary | ICD-10-CM

## 2011-09-08 NOTE — Patient Instructions (Addendum)
No change in medications Stay active, work on weight loss If doing well, followup with me in one year.

## 2011-09-08 NOTE — Progress Notes (Signed)
  Subjective:    Patient ID: Billy Morris, male    DOB: 1970/05/17, 41 y.o.   MRN: [id number]  HPI The patient comes in today for followup of his known asthma.  He's been compliant with his bronchodilator regimen, and has not had an acute flareup or pulmonary infections since last visit.  He has been very active, and has not seen any significant dyspnea on exertion outside of his norm.   Review of Systems  Constitutional: Negative for fever and unexpected weight change.  HENT: Positive for congestion. Negative for ear pain, nosebleeds, sore throat, rhinorrhea, sneezing, trouble swallowing, dental problem, postnasal drip and sinus pressure.   Eyes: Negative for redness and itching.  Respiratory: Negative for cough, chest tightness, shortness of breath and wheezing.   Cardiovascular: Negative for palpitations and leg swelling.  Gastrointestinal: Negative for nausea and vomiting.  Genitourinary: Negative for dysuria.  Musculoskeletal: Negative for joint swelling.  Skin: Negative for rash.  Neurological: Negative for headaches.  Hematological: Does not bruise/bleed easily.  Psychiatric/Behavioral: Negative for dysphoric mood. The patient is nervous/anxious.   All other systems reviewed and are negative.       Objective:   Physical Exam Well-developed male in no acute distress Nose without purulence or discharge noted Chest clear to auscultation, no wheezing Cardiac exam with regular rate and rhythm Lower extremities without edema, no cyanosis Alert and oriented, moves all 4 extremities.       Assessment & Plan:

## 2011-09-08 NOTE — Assessment & Plan Note (Signed)
The patient is stable from an asthma standpoint, and I have asked him to continue on his current bronchodilator regimen.  I have also stressed to him the importance of modest weight loss and conditioning.  If doing well, he will followup with me in one year.

## 2011-11-05 ENCOUNTER — Encounter: Payer: Self-pay | Admitting: Internal Medicine

## 2011-11-05 ENCOUNTER — Other Ambulatory Visit (INDEPENDENT_AMBULATORY_CARE_PROVIDER_SITE_OTHER): Payer: BC Managed Care – PPO

## 2011-11-05 ENCOUNTER — Ambulatory Visit (INDEPENDENT_AMBULATORY_CARE_PROVIDER_SITE_OTHER): Payer: BC Managed Care – PPO | Admitting: Internal Medicine

## 2011-11-05 VITALS — BP 114/82 | HR 62 | Temp 97.6°F | Ht 66.0 in | Wt 191.0 lb

## 2011-11-05 DIAGNOSIS — H9190 Unspecified hearing loss, unspecified ear: Secondary | ICD-10-CM

## 2011-11-05 DIAGNOSIS — Z23 Encounter for immunization: Secondary | ICD-10-CM

## 2011-11-05 DIAGNOSIS — Z0000 Encounter for general adult medical examination without abnormal findings: Secondary | ICD-10-CM

## 2011-11-05 LAB — LIPID PANEL
Cholesterol: 196 mg/dL (ref 0–200)
HDL: 55.2 mg/dL (ref >39.00)
LDL Cholesterol: 123 mg/dL — ABNORMAL HIGH (ref 0–99)
Total CHOL/HDL Ratio: 4
Triglycerides: 90 mg/dL (ref 0.0–149.0)

## 2011-11-05 LAB — URINALYSIS, ROUTINE W REFLEX MICROSCOPIC
Hgb urine dipstick: NEGATIVE
Nitrite: NEGATIVE
Total Protein, Urine: NEGATIVE
Urine Glucose: NEGATIVE
pH: 6.5 (ref 5.0–8.0)

## 2011-11-05 LAB — CBC WITH DIFFERENTIAL/PLATELET
Basophils Absolute: 0.1 10*3/uL (ref 0.0–0.1)
Basophils Relative: 0.9 % (ref 0.0–3.0)
Eosinophils Absolute: 0.1 10*3/uL (ref 0.0–0.7)
Eosinophils Relative: 1.6 % (ref 0.0–5.0)
Hemoglobin: 14.4 g/dL (ref 13.0–17.0)
Lymphocytes Relative: 38.4 % (ref 12.0–46.0)
MCHC: 33.5 g/dL (ref 30.0–36.0)
MCV: 93.9 fl (ref 78.0–100.0)
Monocytes Relative: 10.1 % (ref 3.0–12.0)
Neutro Abs: 2.7 10*3/uL (ref 1.4–7.7)
Neutrophils Relative %: 49 % (ref 43.0–77.0)
RBC: 4.56 Mil/uL (ref 4.22–5.81)
WBC: 5.4 10*3/uL (ref 4.5–10.5)

## 2011-11-05 LAB — IBC PANEL
Iron: 96 ug/dL (ref 42–165)
Transferrin: 296.1 mg/dL (ref 212.0–360.0)

## 2011-11-05 LAB — HEPATIC FUNCTION PANEL
ALT: 21 U/L (ref 0–53)
Albumin: 4.1 g/dL (ref 3.5–5.2)
Bilirubin, Direct: 0.1 mg/dL (ref 0.0–0.3)
Total Bilirubin: 0.9 mg/dL (ref 0.3–1.2)
Total Protein: 6.7 g/dL (ref 6.0–8.3)

## 2011-11-05 LAB — BASIC METABOLIC PANEL
BUN: 12 mg/dL (ref 6–23)
Calcium: 9.2 mg/dL (ref 8.4–10.5)
Chloride: 104 mEq/L (ref 96–112)
Creatinine, Ser: 0.7 mg/dL (ref 0.4–1.5)
Glucose, Bld: 97 mg/dL (ref 70–99)
Potassium: 4 mEq/L (ref 3.5–5.1)

## 2011-11-05 LAB — PSA: PSA: 0.43 ng/mL (ref 0.10–4.00)

## 2011-11-05 NOTE — Assessment & Plan Note (Signed)
For second opinion ENT /hearing loss - ? Needs hearing aids

## 2011-11-05 NOTE — Assessment & Plan Note (Addendum)

## 2011-11-05 NOTE — Patient Instructions (Addendum)
You had the flu shot today You are other wise up to date with prevention Please continue your efforts at being more active, low cholesterol diet, and weight control. Please go to LAB in the [facility] for the blood and/or urine tests to be done today You will be contacted by phone if any changes need to be made immediately.  Otherwise, you will receive a letter about your results with an explanation. Please remember to sign up for My Chart at your earliest convenience, as this will be important to you in the future with finding out test results. You will be contacted regarding the referral for: ENT Please return in 1 year for your yearly visit, or sooner if needed, with Lab testing done 3-5 days before

## 2011-11-05 NOTE — Progress Notes (Signed)
Subjective:    Patient ID: Billy Morris, male    DOB: [birth year], 41 y.o.   MRN: [id number]  HPI  Here for wellness and f/u;  Overall doing ok;  Pt denies CP, worsening SOB, DOE, wheezing, orthopnea, PND, worsening LE edema, palpitations, dizziness or syncope.  Pt denies neurological change such as new Headache, facial or extremity weakness.  Pt denies polydipsia, polyuria, or low sugar symptoms. Pt states overall good compliance with treatment and medications, good tolerability, and trying to follow lower cholesterol diet.  Pt denies worsening depressive symptoms, suicidal ideation or panic. No fever, wt loss, night sweats, loss of appetite, or other constitutional symptoms.  Pt states good ability with ADL's, low fall risk, home safety reviewed and adequate, no significant changes in hearing or vision, and occasionally active with exercise, plans to do more as he has gained wt in the past yr gradually, 187 to current 191.  Needs labs and info for work biometrics today. Does have ongoing bilat hearing loss, right > left, ? Due to guns and loud workplaces, has seen [facility] and per pt not told whether hearing aids would help, kept getting more tests with no answers or help so he quit. Past Medical History  Diagnosis Date  . Hearing loss     chronic right  . Allergic rhinitis   . Asthma   . Anxiety   . GERD (gastroesophageal reflux disease)   . LBP (low back pain)   . H. pylori infection   . Hyperlipidemia   . Anal fissure    Past Surgical History  Procedure Date  . Vasectomy     reports that he has never smoked. He has never used smokeless tobacco. He reports that he does not drink alcohol or use illicit drugs. family history includes Allergies in his other; Asthma in his son; COPD in his maternal grandfather; Heart attack in his maternal grandmother; Heart failure in his other; Hyperlipidemia in his father and mother; Other in his mother and other; Prostate cancer in his maternal grandfather;  and Stroke in his maternal aunt. Allergies  Allergen Reactions  . Aspirin   . Ciprofloxacin     REACTION: stools bloody mucous  . Penicillins   . Sulfonamide Derivatives   . Tiotropium Bromide Monohydrate     REACTION: heart racing and paresthesias   Current Outpatient Prescriptions on File Prior to Visit  Medication Sig Dispense Refill  . albuterol (PROAIR HFA) 108 (90 BASE) MCG/ACT inhaler Inhale 2 puffs into the lungs every 6 (six) hours as needed.        . fluticasone (FLONASE) 50 MCG/ACT nasal spray USE TWO SPRAYS IN EACH NOSTRIL ONCE DAILY.  16 g  10  . ibuprofen (ADVIL) 200 MG tablet Take 200 mg by mouth every 6 (six) hours as needed.        . lansoprazole (PREVACID) 30 MG capsule TAKE ONE CAPSULE BY MOUTH TWICE DAILY  60 capsule  11  . levocetirizine (XYZAL) 5 MG tablet TAKE ONE TABLET BY MOUTH EVERY DAY FOR  ALLERGIES  30 tablet  11  . QVAR 80 MCG/ACT inhaler INHALE TWO PUFFS TWICE DAILY  9 g  5  . SINGULAIR 10 MG tablet TAKE ONE TABLET BY MOUTH EVERY DAY  30 each  11   Review of Systems Review of Systems  Constitutional: Negative for diaphoresis, activity change, appetite change and unexpected weight change.  HENT: Negative for hearing loss, ear pain, facial swelling, mouth sores and neck stiffness.  Eyes: Negative for pain, redness and visual disturbance.  Respiratory: Negative for shortness of breath and wheezing.   Cardiovascular: Negative for chest pain and palpitations.  Gastrointestinal: Negative for diarrhea, blood in stool, abdominal distention and rectal pain.  Genitourinary: Negative for hematuria, flank pain and decreased urine volume.  Musculoskeletal: Negative for myalgias and joint swelling.  Skin: Negative for color change and wound.  Neurological: Negative for syncope and numbness.  Hematological: Negative for adenopathy.  Psychiatric/Behavioral: Negative for hallucinations, self-injury, decreased concentration and agitation.      Objective:   Physical  Exam BP 114/82  Pulse 62  Temp 97.6 F (36.4 C) (Oral)  Ht 5\' 6"  (1.676 m)  Wt 191 lb (86.637 kg)  BMI 30.83 kg/m2  SpO2 97% Physical Exam  VS noted Constitutional: Pt is oriented to person, place, and time. Appears well-developed and well-nourished.  HENT:  Head: Normocephalic and atraumatic.  Right Ear: External ear normal.  Left Ear: External ear normal.  Nose: Nose normal.  Mouth/Throat: Oropharynx is clear and moist.  Eyes: Conjunctivae and EOM are normal. Pupils are equal, round, and reactive to light.  Neck: Normal range of motion. Neck supple. No JVD present. No tracheal deviation present.  Cardiovascular: Normal rate, regular rhythm, normal heart sounds and intact distal pulses.   Pulmonary/Chest: Effort normal and breath sounds normal.  Abdominal: Soft. Bowel sounds are normal. There is no tenderness.  Musculoskeletal: Normal range of motion. Exhibits no edema.  Lymphadenopathy:  Has no cervical adenopathy.  Neurological: Pt is alert and oriented to person, place, and time. Pt has normal reflexes. No cranial nerve deficit. Except for bilat hearing loss Skin: Skin is warm and dry. No rash noted.  Psychiatric:  Has  normal mood and affect. Behavior is normal.     Assessment & Plan:

## 2012-01-26 ENCOUNTER — Other Ambulatory Visit: Payer: Self-pay | Admitting: Internal Medicine

## 2012-02-26 ENCOUNTER — Other Ambulatory Visit: Payer: Self-pay | Admitting: Internal Medicine

## 2012-04-21 ENCOUNTER — Other Ambulatory Visit: Payer: Self-pay | Admitting: Internal Medicine

## 2012-04-22 MED ORDER — LEVOCETIRIZINE DIHYDROCHLORIDE 5 MG PO TABS: 5.0000 mg | ORAL_TABLET | Freq: Every evening | ORAL | Status: DC

## 2012-04-22 NOTE — Addendum Note (Signed)
Addended by: Scharlene Gloss B on: 04/22/2012 08:09 AM   Modules accepted: Orders

## 2012-06-18 ENCOUNTER — Other Ambulatory Visit: Payer: Self-pay | Admitting: Pulmonary Disease

## 2012-09-09 ENCOUNTER — Ambulatory Visit: Payer: BC Managed Care – PPO | Admitting: Pulmonary Disease

## 2012-09-26 ENCOUNTER — Ambulatory Visit: Payer: BC Managed Care – PPO | Admitting: Pulmonary Disease

## 2012-10-17 ENCOUNTER — Ambulatory Visit (INDEPENDENT_AMBULATORY_CARE_PROVIDER_SITE_OTHER): Payer: BC Managed Care – PPO | Admitting: Pulmonary Disease

## 2012-10-17 ENCOUNTER — Encounter: Payer: Self-pay | Admitting: Pulmonary Disease

## 2012-10-17 VITALS — BP 138/92 | HR 65 | Temp 97.4°F | Ht 66.0 in | Wt 197.4 lb

## 2012-10-17 DIAGNOSIS — J45909 Unspecified asthma, uncomplicated: Secondary | ICD-10-CM

## 2012-10-17 NOTE — Progress Notes (Signed)
  Subjective:    Patient ID: Billy Morris, male    DOB: 04-27-1970, 42 y.o.   MRN: [id number]  HPI The patient comes in today for followup of his known asthma.  He has not had an acute exacerbation since his last visit, and rarely uses his rescue inhaler.  Most recently, he has gained weight and has become sedentary, and therefore has noticed some change in his breathing.  He is motivated to work on this.   Review of Systems  Constitutional: Negative for fever and unexpected weight change.  HENT: Negative for ear pain, nosebleeds, congestion, sore throat, rhinorrhea, sneezing, trouble swallowing, dental problem, postnasal drip and sinus pressure.   Eyes: Negative for redness and itching.  Respiratory: Negative for cough, chest tightness, shortness of breath and wheezing.   Cardiovascular: Negative for palpitations and leg swelling.  Gastrointestinal: Negative for nausea and vomiting.  Genitourinary: Negative for dysuria.  Musculoskeletal: Negative for joint swelling.  Skin: Negative for rash.  Neurological: Negative for headaches.  Hematological: Does not bruise/bleed easily.  Psychiatric/Behavioral: Negative for dysphoric mood. The patient is not nervous/anxious.        Objective:   Physical Exam Overweight male in no acute distress Nose without purulence or discharge noted Neck without lymphadenopathy or thyromegaly Chest with clear breath sounds, no wheezes Cardiac exam with regular rate and rhythm Lower extremities without edema, no cyanosis Alert and oriented, moves all 4 extremities.       Assessment & Plan:

## 2012-10-17 NOTE — Patient Instructions (Addendum)
No change in asthma medications. Work on conditioning and weight loss followup with me in one year, but call if you feel breathing is not returning to baseline with weight loss.

## 2012-10-17 NOTE — Assessment & Plan Note (Signed)
The pt is doing well from an asthma standpoint, with no acute exacerbation.  He does have periods where he feels his breathing is not at baseline, but does not require rescue inhaler.  The pt has gained weight recently, and has been sedentary.  I have asked him to work on this, and call if he does not improve.

## 2012-11-08 ENCOUNTER — Encounter: Payer: Self-pay | Admitting: Internal Medicine

## 2012-11-08 ENCOUNTER — Other Ambulatory Visit (INDEPENDENT_AMBULATORY_CARE_PROVIDER_SITE_OTHER): Payer: BC Managed Care – PPO

## 2012-11-08 ENCOUNTER — Ambulatory Visit (INDEPENDENT_AMBULATORY_CARE_PROVIDER_SITE_OTHER): Payer: BC Managed Care – PPO | Admitting: Internal Medicine

## 2012-11-08 VITALS — BP 114/86 | HR 64 | Temp 97.7°F | Ht 66.0 in | Wt 193.0 lb

## 2012-11-08 DIAGNOSIS — L290 Pruritus ani: Secondary | ICD-10-CM

## 2012-11-08 DIAGNOSIS — Z0000 Encounter for general adult medical examination without abnormal findings: Secondary | ICD-10-CM

## 2012-11-08 DIAGNOSIS — Z23 Encounter for immunization: Secondary | ICD-10-CM

## 2012-11-08 LAB — CBC WITH DIFFERENTIAL/PLATELET
Basophils Absolute: 0 10*3/uL (ref 0.0–0.1)
Basophils Relative: 0.7 % (ref 0.0–3.0)
Eosinophils Absolute: 0.1 10*3/uL (ref 0.0–0.7)
Eosinophils Relative: 1.5 % (ref 0.0–5.0)
Lymphs Abs: 2.4 10*3/uL (ref 0.7–4.0)
MCHC: 34.1 g/dL (ref 30.0–36.0)
MCV: 92.2 fl (ref 78.0–100.0)
Monocytes Absolute: 0.6 10*3/uL (ref 0.1–1.0)
Neutrophils Relative %: 46.1 % (ref 43.0–77.0)
Platelets: 242 10*3/uL (ref 150.0–400.0)
RBC: 4.61 Mil/uL (ref 4.22–5.81)
RDW: 12.4 % (ref 11.5–14.6)

## 2012-11-08 LAB — HEPATIC FUNCTION PANEL
ALT: 19 U/L (ref 0–53)
AST: 18 U/L (ref 0–37)
Alkaline Phosphatase: 60 U/L (ref 39–117)
Bilirubin, Direct: 0.2 mg/dL (ref 0.0–0.3)
Total Bilirubin: 0.9 mg/dL (ref 0.3–1.2)
Total Protein: 6.7 g/dL (ref 6.0–8.3)

## 2012-11-08 LAB — URINALYSIS, ROUTINE W REFLEX MICROSCOPIC
Bilirubin Urine: NEGATIVE
Hgb urine dipstick: NEGATIVE
Ketones, ur: NEGATIVE
Nitrite: NEGATIVE
Total Protein, Urine: NEGATIVE
pH: 6 (ref 5.0–8.0)

## 2012-11-08 LAB — BASIC METABOLIC PANEL
BUN: 14 mg/dL (ref 6–23)
Chloride: 106 mEq/L (ref 96–112)
Potassium: 4.6 mEq/L (ref 3.5–5.1)
Sodium: 138 mEq/L (ref 135–145)

## 2012-11-08 LAB — LIPID PANEL
LDL Cholesterol: 106 mg/dL — ABNORMAL HIGH (ref 0–99)
Total CHOL/HDL Ratio: 3

## 2012-11-08 LAB — PSA: PSA: 0.32 ng/mL (ref 0.10–4.00)

## 2012-11-08 MED ORDER — HYDROCORTISONE 2.5 % RE CREA: TOPICAL_CREAM | Freq: Two times a day (BID) | RECTAL | Status: DC

## 2012-11-08 NOTE — Assessment & Plan Note (Signed)
To keep clean as able, for proctosol prn,  to f/u any worsening symptoms or concerns

## 2012-11-08 NOTE — Progress Notes (Signed)
Subjective:    Patient ID: Billy Morris, male    DOB: 01/17/1971, 42 y.o.   MRN: [id number]  HPI  /Here for wellness and f/u;  Overall doing ok;  Pt denies CP, worsening SOB, DOE, wheezing, orthopnea, PND, worsening LE edema, palpitations, dizziness or syncope.  Pt denies neurological change such as new headache, facial or extremity weakness.  Pt denies polydipsia, polyuria, or low sugar symptoms. Pt states overall good compliance with treatment and medications, good tolerability, and has been trying to follow lower cholesterol diet.  Pt denies worsening depressive symptoms, suicidal ideation or panic. No fever, night sweats, wt loss, loss of appetite, or other constitutional symptoms.  Pt states good ability with ADL's, has low fall risk, home safety reviewed and adequate, no other significant changes in hearing or vision, and only occasionally active with exercise.  In fact has gained several lbs.  Both parents with BP and elev chol.  Usually checks his BP monthly at home but not lately.  BP at recent right ear stapedectomy was 114/86, he is concerned as the diastolic is higher than in past, ? Assoc with wt gain.  Did see pulm 2 wks ago for asthma.  Going to gym after visit today.  Trying to walk at least 4 - 10 miles per wk.   Past Medical History  Diagnosis Date  . Hearing loss     chronic right  . Allergic rhinitis   . Asthma   . Anxiety   . GERD (gastroesophageal reflux disease)   . LBP (low back pain)   . H. pylori infection   . Hyperlipidemia   . Anal fissure    Past Surgical History  Procedure Laterality Date  . Vasectomy      reports that he has never smoked. He has never used smokeless tobacco. He reports that he does not drink alcohol or use illicit drugs. family history includes Allergies in his other; Asthma in his son; COPD in his maternal grandfather; Heart attack in his maternal grandmother; Heart failure in his other; Hyperlipidemia in his father and mother; Other in his  mother and other; Prostate cancer in his maternal grandfather; Stroke in his maternal aunt. Allergies  Allergen Reactions  . Aspirin   . Ciprofloxacin     REACTION: stools bloody mucous  . Penicillins   . Sulfonamide Derivatives   . Tiotropium Bromide Monohydrate     REACTION: heart racing and paresthesias   Current Outpatient Prescriptions on File Prior to Visit  Medication Sig Dispense Refill  . albuterol (PROAIR HFA) 108 (90 BASE) MCG/ACT inhaler Inhale 2 puffs into the lungs every 6 (six) hours as needed.        . fluticasone (FLONASE) 50 MCG/ACT nasal spray USE TWO SPRAYS IN EACH NOSTRIL ONCE DAILY.  16 g  10  . ibuprofen (ADVIL) 200 MG tablet Take 200 mg by mouth every 6 (six) hours as needed.        . lansoprazole (PREVACID) 30 MG capsule TAKE ONE CAPSULE BY MOUTH TWICE DAILY  60 capsule  10  . levocetirizine (XYZAL) 5 MG tablet Take 1 tablet (5 mg total) by mouth every evening.  30 tablet  6  . montelukast (SINGULAIR) 10 MG tablet TAKE ONE TABLET BY MOUTH EVERY DAY  30 tablet  6  . QVAR 80 MCG/ACT inhaler INHALE TWO PUFFS TWICE DAILY  1 Inhaler  2   No current facility-administered medications on file prior to visit.    Review of Systems Constitutional:  Negative for diaphoresis, activity change, appetite change or unexpected weight change.  HENT: Negative for hearing loss, ear pain, facial swelling, mouth sores and neck stiffness.   Eyes: Negative for pain, redness and visual disturbance.  Respiratory: Negative for shortness of breath and wheezing.   Cardiovascular: Negative for chest pain and palpitations.  Gastrointestinal: Negative for diarrhea, blood in stool, abdominal distention or other pain Genitourinary: Negative for hematuria, flank pain or change in urine volume.  Musculoskeletal: Negative for myalgias and joint swelling.  Skin: Negative for color change and wound. Did have some crown hair growth restart after the right ear surgury Neurological: Negative for  syncope and numbness. other than noted Hematological: Negative for adenopathy.  Psychiatric/Behavioral: Negative for hallucinations, self-injury, decreased concentration and agitation.     Objective:   Physical Exam BP 114/86  Pulse 64  Temp(Src) 97.7 F (36.5 C) (Oral)  Ht 5\' 6"  (1.676 m)  Wt 193 lb (87.544 kg)  BMI 31.17 kg/m2  SpO2 98% VS noted,  Constitutional: Pt is oriented to person, place, and time. Appears well-developed and well-nourished. Lavella Lemons Head: Normocephalic and atraumatic.  Right Ear: External ear normal.  Left Ear: External ear normal.  Nose: Nose normal.  Mouth/Throat: Oropharynx is clear and moist.  Eyes: Conjunctivae and EOM are normal. Pupils are equal, round, and reactive to light.  Neck: Normal range of motion. Neck supple. No JVD present. No tracheal deviation present.  Cardiovascular: Normal rate, regular rhythm, normal heart sounds and intact distal pulses.   Pulmonary/Chest: Effort normal and breath sounds normal.  Abdominal: Soft. Bowel sounds are normal. There is no tenderness. No HSM  Has some constipation recently with a anal tear now improved but occas itches.  Also some odd smelling stool, occas abd bloating, all some better with yogurt. No BRBPR Musculoskeletal: Normal range of motion. Exhibits no edema.  Lymphadenopathy:  Has no cervical adenopathy.  Neurological: Pt is alert and oriented to person, place, and time. Pt has normal reflexes. No cranial nerve deficit.  Skin: Skin is warm and dry. No rash noted. Small mole to crown noted, regular, 5 mm nonraised, several NT le varicosities noted, some trace pedal edema blat Psychiatric:  Has  normal mood and affect. Behavior is normal.  Anal with 1.5 cm mild irritation surrounding, no ulcer, hemhorrhoid or drainage    Assessment & Plan:

## 2012-11-08 NOTE — Assessment & Plan Note (Signed)

## 2012-11-08 NOTE — Patient Instructions (Addendum)
You had the flu shot today Please take all new medication as prescribed - the cream as needed Please continue all other medications as before Please have the pharmacy call with any other refills you may need. You had your blood drawn today You will be contacted by phone if any changes need to be made immediately.  Otherwise, you will receive a letter about your results with an explanation, but please check with MyChart first. Please continue your efforts at being more active, low cholesterol diet, and weight control. You are otherwise up to date with prevention measures today.  Please remember to sign up for My Chart if you have not done so, as this will be important to you in the future with finding out test results, communicating by private email, and scheduling acute appointments online when needed.  Please return in 1 year for your yearly visit, or sooner if needed, with Lab testing done 3-5 days before

## 2012-12-10 ENCOUNTER — Other Ambulatory Visit: Payer: Self-pay | Admitting: Pulmonary Disease

## 2012-12-10 ENCOUNTER — Other Ambulatory Visit: Payer: Self-pay | Admitting: Internal Medicine

## 2013-01-13 ENCOUNTER — Other Ambulatory Visit: Payer: Self-pay | Admitting: Internal Medicine

## 2013-02-09 HISTORY — PX: OTHER SURGICAL HISTORY: SHX169

## 2013-02-22 ENCOUNTER — Other Ambulatory Visit: Payer: Self-pay | Admitting: Internal Medicine

## 2013-04-15 ENCOUNTER — Other Ambulatory Visit: Payer: Self-pay | Admitting: Internal Medicine

## 2013-04-18 ENCOUNTER — Other Ambulatory Visit: Payer: Self-pay

## 2013-04-18 MED ORDER — FLUTICASONE PROPIONATE 50 MCG/ACT NA SUSP: 2.0000 | Freq: Every day | NASAL | Status: DC

## 2013-05-23 ENCOUNTER — Other Ambulatory Visit: Payer: Self-pay | Admitting: Internal Medicine

## 2013-05-28 ENCOUNTER — Encounter (HOSPITAL_COMMUNITY): Payer: Self-pay | Admitting: Emergency Medicine

## 2013-05-28 ENCOUNTER — Emergency Department (HOSPITAL_COMMUNITY)
Admission: EM | Admit: 2013-05-28 | Discharge: 2013-05-28 | Disposition: A | Discharge status: Home / Self Care | Payer: BC Managed Care – PPO | Source: Home / Self Care | Attending: Emergency Medicine | Admitting: Emergency Medicine

## 2013-05-28 DIAGNOSIS — M5416 Radiculopathy, lumbar region: Secondary | ICD-10-CM

## 2013-05-28 DIAGNOSIS — IMO0002 Reserved for concepts with insufficient information to code with codable children: Secondary | ICD-10-CM

## 2013-05-28 MED ORDER — HYDROCODONE-ACETAMINOPHEN 5-325 MG PO TABS: 1.0000 | ORAL_TABLET | Freq: Four times a day (QID) | ORAL | Status: DC | PRN

## 2013-05-28 MED ORDER — NAPROXEN 500 MG PO TABS
500.0000 mg | ORAL_TABLET | Freq: Two times a day (BID) | ORAL | Status: DC
Start: 2013-05-28 — End: 2013-11-01

## 2013-05-28 MED ORDER — CYCLOBENZAPRINE HCL 5 MG PO TABS: 5.0000 mg | ORAL_TABLET | Freq: Three times a day (TID) | ORAL | Status: DC | PRN

## 2013-05-28 NOTE — ED Provider Notes (Signed)
CSN: [id number]     Arrival date & time 05/28/13  1108 History   First MD Initiated Contact with Patient 05/28/13 1322     Chief Complaint  Patient presents with  . Back Pain   (Consider location/radiation/quality/duration/timing/severity/associated sxs/prior Treatment) HPI Comments: Pt with long standing recurrent L sided low back pain. Is worse than usual with this episode that started last night. Normally doesn't have radiating down LLE. Feels numb and tingling in anterior L thigh and down shin. Denies injury.   Patient is a 43 y.o. male presenting with back pain. The history is provided by the patient.  Back Pain Location:  Lumbar spine Quality:  Aching and cramping Radiates to:  L thigh Pain severity:  Severe Pain is:  Same all the time Onset quality:  Gradual Duration:  1 day Timing:  Constant Progression:  Unchanged Chronicity:  Recurrent Context comment:  Frequently does lifting, twisting, bending at job Relieved by:  Nothing Worsened by:  Ambulation and movement Ineffective treatments:  NSAIDs, heating pad and cold packs Associated symptoms: leg pain, numbness and tingling   Associated symptoms: no abdominal pain, no bladder incontinence, no bowel incontinence, no fever and no weakness     Past Medical History  Diagnosis Date  . Hearing loss     chronic right  . Allergic rhinitis   . Asthma   . Anxiety   . GERD (gastroesophageal reflux disease)   . LBP (low back pain)   . H. pylori infection   . Hyperlipidemia   . Anal fissure    Past Surgical History  Procedure Laterality Date  . Vasectomy     Family History  Problem Relation Age of Onset  . Hyperlipidemia Father   . Other Mother     ibs  . Hyperlipidemia Mother   . Other Other     4 aunts with IBS  . COPD Maternal Grandfather   . Prostate cancer Maternal Grandfather   . Allergies Other     children  . Asthma Son   . Heart failure Other     paternal great grandmother  . Heart attack Maternal  Grandmother   . Stroke Maternal Aunt    History  Substance Use Topics  . Smoking status: Never Smoker   . Smokeless tobacco: Never Used  . Alcohol Use: No    Review of Systems  Constitutional: Negative for fever and chills.  Gastrointestinal: Negative for abdominal pain and bowel incontinence.  Genitourinary: Negative for bladder incontinence.  Musculoskeletal: Positive for back pain.  Skin: Negative for rash.  Neurological: Positive for tingling and numbness. Negative for weakness.    Allergies  Aspirin; Ciprofloxacin; Penicillins; Sulfonamide derivatives; and Tiotropium bromide monohydrate  Home Medications   Prior to Admission medications   Medication Sig Start Date End Date Taking? Authorizing Provider  cyclobenzaprine (FLEXERIL) 5 MG tablet Take 1 tablet (5 mg total) by mouth 3 (three) times daily as needed for muscle spasms. 05/28/13   Carvel Getting, NP  fluticasone (FLONASE) 50 MCG/ACT nasal spray Place 2 sprays into both nostrils daily. 04/18/13   Biagio Borg, MD  HYDROcodone-acetaminophen (NORCO/VICODIN) 5-325 MG per tablet Take 1-2 tablets by mouth every 6 (six) hours as needed. 05/28/13   Carvel Getting, NP  hydrocortisone (PROCTOSOL HC) 2.5 % rectal cream Place rectally 2 (two) times daily. 11/08/12   Biagio Borg, MD  ibuprofen (ADVIL) 200 MG tablet Take 200 mg by mouth every 6 (six) hours as needed.  Historical Provider, MD  lansoprazole (PREVACID) 30 MG capsule TAKE ONE CAPSULE BY MOUTH TWICE DAILY 02/22/13   Biagio Borg, MD  levocetirizine (XYZAL) 5 MG tablet Take 1 tablet (5 mg total) by mouth every evening. 04/22/12   Biagio Borg, MD  levocetirizine (XYZAL) 5 MG tablet TAKE ONE TABLET BY MOUTH ONCE DAILY FOR ALLERGY 05/23/13   Biagio Borg, MD  montelukast (SINGULAIR) 10 MG tablet TAKE ONE TABLET BY MOUTH ONCE DAILY. 01/13/13   Biagio Borg, MD  naproxen (NAPROSYN) 500 MG tablet Take 1 tablet (500 mg total) by mouth 2 (two) times daily. 05/28/13   Carvel Getting,  NP  PROAIR HFA 108 (90 BASE) MCG/ACT inhaler INHALE TWO PUFFS 4 TIMES DAILY AS NEEDED FOR SHORTNESS OF BREATH 05/23/13   Biagio Borg, MD  QVAR 80 MCG/ACT inhaler INHALE TWO PUFFS TWICE DAILY 12/10/12   Kathee Delton, MD   BP 138/81  Pulse 62  Temp(Src) 98.7 F (37.1 C) (Oral)  Resp 17  SpO2 100% Physical Exam  Constitutional: He is oriented to person, place, and time. He appears well-developed and well-nourished. No distress.  Musculoskeletal:       Lumbar back: He exhibits tenderness. He exhibits no bony tenderness, no edema and no deformity.       Left upper leg: He exhibits no tenderness, no edema and no deformity.  Pain L side lower back with straight leg raise on L; none on right. Mild pain with palpation L lower back.   Neurological: He is alert and oriented to person, place, and time. No sensory deficit.  Limping gait favors LLE. Although pt claims numbness and tingling in LLE, sensation to touch and to soft and scratchy touch is intact.     ED Course  Procedures (including critical care time) Labs Review Labs Reviewed - No data to display  Results for orders placed in visit on 11/08/12  LIPID PANEL      Result Value Ref Range   Cholesterol 171  0 - 200 mg/dL   Triglycerides 65.0  0.0 - 149.0 mg/dL   HDL 52.30  >39.00 mg/dL   VLDL 13.0  0.0 - 40.0 mg/dL   LDL Cholesterol 106 (*) 0 - 99 mg/dL   Total CHOL/HDL Ratio 3    BASIC METABOLIC PANEL      Result Value Ref Range   Sodium 138  135 - 145 mEq/L   Potassium 4.6  3.5 - 5.1 mEq/L   Chloride 106  96 - 112 mEq/L   CO2 27  19 - 32 mEq/L   Glucose, Bld 88  70 - 99 mg/dL   BUN 14  6 - 23 mg/dL   Creatinine, Ser 0.8  0.4 - 1.5 mg/dL   Calcium 9.2  8.4 - 10.5 mg/dL   GFR 110.91  >60.00 mL/min  HEPATIC FUNCTION PANEL      Result Value Ref Range   Total Bilirubin 0.9  0.3 - 1.2 mg/dL   Bilirubin, Direct 0.2  0.0 - 0.3 mg/dL   Alkaline Phosphatase 60  39 - 117 U/L   AST 18  0 - 37 U/L   ALT 19  0 - 53 U/L   Total  Protein 6.7  6.0 - 8.3 g/dL   Albumin 4.2  3.5 - 5.2 g/dL  CBC WITH DIFFERENTIAL      Result Value Ref Range   WBC 5.9  4.5 - 10.5 K/uL   RBC 4.61  4.22 - 5.81 Mil/uL  Hemoglobin 14.5  13.0 - 17.0 g/dL   HCT 42.5  39.0 - 52.0 %   MCV 92.2  78.0 - 100.0 fl   MCHC 34.1  30.0 - 36.0 g/dL   RDW 12.4  11.5 - 14.6 %   Platelets 242.0  150.0 - 400.0 K/uL   Neutrophils Relative % 46.1  43.0 - 77.0 %   Lymphocytes Relative 41.5  12.0 - 46.0 %   Monocytes Relative 10.2  3.0 - 12.0 %   Eosinophils Relative 1.5  0.0 - 5.0 %   Basophils Relative 0.7  0.0 - 3.0 %   Neutro Abs 2.7  1.4 - 7.7 K/uL   Lymphs Abs 2.4  0.7 - 4.0 K/uL   Monocytes Absolute 0.6  0.1 - 1.0 K/uL   Eosinophils Absolute 0.1  0.0 - 0.7 K/uL   Basophils Absolute 0.0  0.0 - 0.1 K/uL  TSH      Result Value Ref Range   TSH 1.24  0.35 - 5.50 uIU/mL  URINALYSIS, ROUTINE W REFLEX MICROSCOPIC      Result Value Ref Range   Color, Urine LT. YELLOW  Yellow;Lt. Yellow   APPearance CLEAR  Clear   Specific Gravity, Urine >=1.030  1.000 - 1.030   pH 6.0  5.0 - 8.0   Total Protein, Urine NEGATIVE  Negative   Urine Glucose NEGATIVE  Negative   Ketones, ur NEGATIVE  Negative   Bilirubin Urine NEGATIVE  Negative   Hgb urine dipstick NEGATIVE  Negative   Urobilinogen, UA 0.2  0.0 - 1.0   Leukocytes, UA NEGATIVE  Negative   Nitrite NEGATIVE  Negative   WBC, UA 0-2/hpf  0-2/hpf   RBC / HPF none seen  0-2/hpf   Mucus, UA Presence of  None   Squamous Epithelial / LPF Rare(0-4/hpf)  Rare(0-4/hpf)  PSA      Result Value Ref Range   PSA 0.32  0.10 - 4.00 ng/mL   Imaging Review No results found.   MDM   1. Left lumbar radiculopathy   pt to f/u with pcp. This is long standing recurrent problem that appears to be progressing. Rx naproxen 500mg  BID #30, rx flexeril 5mg  TID prn #30, rx hydrocodone/apap 5/325 1-2 q6 hrs prn pain.      Carvel Getting, NP 05/28/13 1335

## 2013-05-28 NOTE — ED Notes (Signed)
L  Sided  Back  Pain          Pain  Down  l leg      Last  Pm           No  Recent  Injury        denys  Any  Urinary       Symptoms   Similar  Episodes    But  Not  This  Bad       Pt   Reports  Pain worse  On  Movement

## 2013-05-28 NOTE — Discharge Instructions (Signed)
Follow up with Dr. Jenny Reichmann as soon as possible. You may need further testing.    Lumbosacral Radiculopathy Lumbosacral radiculopathy is a pinched nerve or nerves in the low back (lumbosacral area). When this happens you may have weakness in your legs and may not be able to stand on your toes. You may have pain going down into your legs. There may be difficulties with walking normally. There are many causes of this problem. Sometimes this may happen from an injury, or simply from arthritis or boney problems. It may also be caused by other illnesses such as diabetes. If there is no improvement after treatment, further studies may be done to find the exact cause. DIAGNOSIS  X-rays may be needed if the problems become long standing. Electromyograms may be done. This study is one in which the working of nerves and muscles is studied. HOME CARE INSTRUCTIONS   Applications of ice packs may be helpful. Ice can be used in a plastic bag with a towel around it to prevent frostbite to skin. This may be used every 2 hours for 20 to 30 minutes, or as needed, while awake, or as directed by your caregiver.  Only take over-the-counter or prescription medicines for pain, discomfort, or fever as directed by your caregiver.  If physical therapy was prescribed, follow your caregiver's directions. SEEK IMMEDIATE MEDICAL CARE IF:   You have pain not controlled with medications.  You seem to be getting worse rather than better.  You develop increasing weakness in your legs.  You develop loss of bowel or bladder control.  You have difficulty with walking or balance, or develop clumsiness in the use of your legs.  You have a fever. MAKE SURE YOU:   Understand these instructions.  Will watch your condition.  Will get help right away if you are not doing well or get worse. Document Released: 01/26/2005 Document Revised: 04/20/2011 Document Reviewed: 09/16/2007 [facility] Patient Information 2014 [place].

## 2013-05-29 ENCOUNTER — Encounter: Payer: Self-pay | Admitting: Internal Medicine

## 2013-05-29 NOTE — ED Provider Notes (Signed)
Medical screening examination/treatment/procedure(s) were performed by non-physician practitioner and as supervising physician I was immediately available for consultation/collaboration.  Philipp Deputy, M.D.  Harden Mo, MD 05/29/13 1044

## 2013-05-31 ENCOUNTER — Encounter: Payer: Self-pay | Admitting: Internal Medicine

## 2013-05-31 ENCOUNTER — Ambulatory Visit (INDEPENDENT_AMBULATORY_CARE_PROVIDER_SITE_OTHER): Payer: BC Managed Care – PPO | Admitting: Internal Medicine

## 2013-05-31 VITALS — BP 146/90 | HR 102 | Temp 98.6°F | Resp 20 | Ht 66.0 in | Wt 204.0 lb

## 2013-05-31 DIAGNOSIS — IMO0002 Reserved for concepts with insufficient information to code with codable children: Secondary | ICD-10-CM

## 2013-05-31 DIAGNOSIS — M5416 Radiculopathy, lumbar region: Secondary | ICD-10-CM | POA: Insufficient documentation

## 2013-05-31 DIAGNOSIS — M545 Low back pain, unspecified: Secondary | ICD-10-CM

## 2013-05-31 DIAGNOSIS — F411 Generalized anxiety disorder: Secondary | ICD-10-CM

## 2013-05-31 DIAGNOSIS — J45909 Unspecified asthma, uncomplicated: Secondary | ICD-10-CM

## 2013-05-31 MED ORDER — PREDNISONE 10 MG PO TABS: ORAL_TABLET | ORAL | Status: DC

## 2013-05-31 MED ORDER — HYDROCODONE-ACETAMINOPHEN 5-325 MG PO TABS: 1.0000 | ORAL_TABLET | Freq: Four times a day (QID) | ORAL | Status: DC | PRN

## 2013-05-31 NOTE — Patient Instructions (Addendum)
Please take all new medication as prescribed - the prednisone  Please continue all other medications as before, including the refill of the vicodin  Please have the pharmacy call with any other refills you may need.  You will be contacted regarding the referral for: MRI for the lower back, as well as Neurosurgury referral (which can be cancelled if your symptoms go away)  You are given the work note as well

## 2013-05-31 NOTE — Assessment & Plan Note (Signed)
stable overall by history and exam, recent data reviewed with pt, and pt to continue medical treatment as before,  to f/u any worsening symptoms or concerns SpO2 Readings from Last 3 Encounters:  05/31/13 98%  05/28/13 100%  11/08/12 98%

## 2013-05-31 NOTE — Assessment & Plan Note (Signed)
stable overall by history and exam, recent data reviewed with pt, and pt to continue medical treatment as before,  to f/u any worsening symptoms or concerns Lab Results  Component Value Date   WBC 5.9 11/08/2012   HGB 14.5 11/08/2012   HCT 42.5 11/08/2012   PLT 242.0 11/08/2012   GLUCOSE 88 11/08/2012   CHOL 171 11/08/2012   TRIG 65.0 11/08/2012   HDL 52.30 11/08/2012   LDLCALC 106* 11/08/2012   ALT 19 11/08/2012   AST 18 11/08/2012   NA 138 11/08/2012   K 4.6 11/08/2012   CL 106 11/08/2012   CREATININE 0.8 11/08/2012   BUN 14 11/08/2012   CO2 27 11/08/2012   TSH 1.24 11/08/2012   PSA 0.32 11/08/2012

## 2013-05-31 NOTE — Progress Notes (Signed)
Pre-visit discussion using our clinic review tool. No additional management support is needed unless otherwise documented below in the visit note.  

## 2013-05-31 NOTE — Assessment & Plan Note (Signed)
New onset x 5 days, mod to severe pain with neuro change on exam, for MRI LS spine, vicodin prn, predpack asd, refer NS, gave work note,  to f/u any worsening symptoms or concerns

## 2013-05-31 NOTE — Progress Notes (Signed)
Subjective:    Patient ID: Billy Morris, male    DOB: 1970-10-29, 43 y.o.   MRN: [id number]  HPI here to fu with acute, has hx of recurrent LBP, most recently 3 days ago with acute onset rapidly worsening left LBP with mild numbness/ weakness with radiation to the distal LLE, mod to severe, had to leave work early, not worked since, saw UC the second day as he couldn't and cant lie down flat (except for brief time on his side);  No bowel or bladder change, fever, wt loss, gait change or fall but has sitting mostly. Tx with muscle relaxer at UC. 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 No recent MRI, PT, or surgical eval. Pt denies chest pain, increased sob or doe, wheezing, orthopnea, PND, increased LE swelling, palpitations, dizziness or syncope. Denies worsening depressive symptoms, suicidal ideation, or panic  Past Medical History  Diagnosis Date  . Hearing loss     chronic right  . Allergic rhinitis   . Asthma   . Anxiety   . GERD (gastroesophageal reflux disease)   . LBP (low back pain)   . H. pylori infection   . Hyperlipidemia   . Anal fissure    Past Surgical History  Procedure Laterality Date  . Vasectomy      reports that he has never smoked. He has never used smokeless tobacco. He reports that he does not drink alcohol or use illicit drugs. family history includes Allergies in his other; Asthma in his son; COPD in his maternal grandfather; Heart attack in his maternal grandmother; Heart failure in his other; Hyperlipidemia in his father and mother; Other in his mother and other; Prostate cancer in his maternal grandfather; Stroke in his maternal aunt. Allergies  Allergen Reactions  . Aspirin   . Ciprofloxacin     REACTION: stools bloody mucous  . Penicillins   . Sulfonamide Derivatives   . Tiotropium Bromide Monohydrate     REACTION: heart racing and paresthesias   Current Outpatient Prescriptions on File  Prior to Visit  Medication Sig Dispense Refill  . cyclobenzaprine (FLEXERIL) 5 MG tablet Take 1 tablet (5 mg total) by mouth 3 (three) times daily as needed for muscle spasms.  30 tablet  0  . fluticasone (FLONASE) 50 MCG/ACT nasal spray Place 2 sprays into both nostrils daily.  16 g  6  . hydrocortisone (PROCTOSOL HC) 2.5 % rectal cream Place rectally 2 (two) times daily.  30 g  0  . ibuprofen (ADVIL) 200 MG tablet Take 200 mg by mouth every 6 (six) hours as needed.        . lansoprazole (PREVACID) 30 MG capsule TAKE ONE CAPSULE BY MOUTH TWICE DAILY  60 capsule  11  . levocetirizine (XYZAL) 5 MG tablet TAKE ONE TABLET BY MOUTH ONCE DAILY FOR ALLERGY  30 tablet  6  . montelukast (SINGULAIR) 10 MG tablet TAKE ONE TABLET BY MOUTH ONCE DAILY.  30 tablet  11  . naproxen (NAPROSYN) 500 MG tablet Take 1 tablet (500 mg total) by mouth 2 (two) times daily.  30 tablet  0  . PROAIR HFA 108 (90 BASE) MCG/ACT inhaler INHALE TWO PUFFS 4 TIMES DAILY AS NEEDED FOR SHORTNESS OF BREATH  1 each  6  . QVAR 80 MCG/ACT inhaler INHALE TWO PUFFS TWICE DAILY  1 Inhaler  9   No current facility-administered medications on file prior to visit.  Review of Systems  Constitutional: Negative for unexpected weight change, or unusual diaphoresis  HENT: Negative for tinnitus.   Eyes: Negative for photophobia and visual disturbance.  Respiratory: Negative for choking and stridor.   Gastrointestinal: Negative for vomiting and blood in stool.  Genitourinary: Negative for hematuria and decreased urine volume.  Musculoskeletal: Negative for acute joint swelling Skin: Negative for color change and wound.  Neurological: Negative for tremors and numbness other than noted  Psychiatric/Behavioral: Negative for decreased concentration or  hyperactivity.       Objective:   Physical Exam BP 146/90  Pulse 102  Temp(Src) 98.6 F (37 C) (Oral)  Resp 20  Ht 5\' 6"  (1.676 m)  Wt 204 lb (92.534 kg)  BMI 32.94 kg/m2  SpO2  98% VS noted, not ill appering Constitutional: Pt appears well-developed and well-nourished.  HENT: Head: NCAT.  Right Ear: External ear normal.  Left Ear: External ear normal.  Eyes: Conjunctivae and EOM are normal. Pupils are equal, round, and reactive to light.  Neck: Normal range of motion. Neck supple.  Cardiovascular: Normal rate and regular rhythm.   Pulmonary/Chest: Effort normal and breath sounds normal.  Abd:  Soft, NT, non-distended, + BS Spine: nontender except for lower lumbar paravertebral tender muscular spasm Neurological: Pt is alert. Not confused , motor 4+/5 distal LLE with decr sens medial leg/ankle/instep, dtr intact Skin: Skin is warm. No erythema, no rash or edema.  Psychiatric: Pt behavior is normal. Thought content normal.     Assessment & Plan:

## 2013-06-01 ENCOUNTER — Other Ambulatory Visit: Payer: Self-pay | Admitting: Internal Medicine

## 2013-06-01 DIAGNOSIS — M545 Low back pain, unspecified: Secondary | ICD-10-CM

## 2013-06-01 DIAGNOSIS — M5416 Radiculopathy, lumbar region: Secondary | ICD-10-CM

## 2013-06-02 ENCOUNTER — Ambulatory Visit
Admission: RE | Admit: 2013-06-02 | Discharge: 2013-06-02 | Disposition: A | Discharge status: Home / Self Care | Payer: BC Managed Care – PPO | Source: Ambulatory Visit | Attending: Internal Medicine | Admitting: Internal Medicine

## 2013-06-02 DIAGNOSIS — M545 Low back pain, unspecified: Secondary | ICD-10-CM

## 2013-06-02 DIAGNOSIS — M5416 Radiculopathy, lumbar region: Secondary | ICD-10-CM

## 2013-06-08 ENCOUNTER — Encounter: Payer: Self-pay | Admitting: Internal Medicine

## 2013-06-15 ENCOUNTER — Other Ambulatory Visit: Payer: Self-pay | Admitting: Neurosurgery

## 2013-06-21 ENCOUNTER — Other Ambulatory Visit: Payer: Self-pay | Admitting: Neurosurgery

## 2013-06-21 DIAGNOSIS — M549 Dorsalgia, unspecified: Secondary | ICD-10-CM

## 2013-06-21 DIAGNOSIS — Z0279 Encounter for issue of other medical certificate: Secondary | ICD-10-CM

## 2013-06-23 ENCOUNTER — Other Ambulatory Visit: Payer: Self-pay | Admitting: Neurosurgery

## 2013-06-23 ENCOUNTER — Ambulatory Visit
Admission: RE | Admit: 2013-06-23 | Discharge: 2013-06-23 | Disposition: A | Discharge status: Home / Self Care | Payer: BC Managed Care – PPO | Source: Ambulatory Visit | Attending: Neurosurgery | Admitting: Neurosurgery

## 2013-06-23 VITALS — BP 147/88 | HR 69

## 2013-06-23 DIAGNOSIS — M549 Dorsalgia, unspecified: Secondary | ICD-10-CM

## 2013-06-23 MED ORDER — METHYLPREDNISOLONE ACETATE 40 MG/ML INJ SUSP (RADIOLOG
120.0000 mg | Freq: Once | INTRAMUSCULAR | Status: AC
  Administered 2013-06-23: 120 mg via EPIDURAL

## 2013-06-23 MED ORDER — IOHEXOL 180 MG/ML  SOLN
1.0000 mL | Freq: Once | INTRAMUSCULAR | Status: AC | PRN
  Administered 2013-06-23: 1 mL via INTRATHECAL

## 2013-06-23 NOTE — Discharge Instructions (Signed)

## 2013-06-28 ENCOUNTER — Encounter: Payer: Self-pay | Admitting: Internal Medicine

## 2013-08-29 ENCOUNTER — Telehealth: Payer: Self-pay | Admitting: Internal Medicine

## 2013-08-29 NOTE — Telephone Encounter (Signed)
Patient has swelling and pain in his left testicle.  He does not wish to see another provider and Dr. Jenny Reichmann is booked this week.  He is requesting to be worked in.  Please advise.

## 2013-08-29 NOTE — Telephone Encounter (Signed)
Ok to see tomorrow addon

## 2013-08-30 NOTE — Telephone Encounter (Signed)
Patient has improved.  He has to go out of town today.  He will wait to see what happens.  Might go to urologist if does not improve.

## 2013-10-03 ENCOUNTER — Ambulatory Visit (INDEPENDENT_AMBULATORY_CARE_PROVIDER_SITE_OTHER): Payer: BC Managed Care – PPO | Admitting: Internal Medicine

## 2013-10-03 ENCOUNTER — Encounter: Payer: Self-pay | Admitting: Internal Medicine

## 2013-10-03 VITALS — BP 120/80 | HR 76 | Temp 98.6°F | Wt 201.2 lb

## 2013-10-03 DIAGNOSIS — F411 Generalized anxiety disorder: Secondary | ICD-10-CM

## 2013-10-03 DIAGNOSIS — H6982 Other specified disorders of Eustachian tube, left ear: Secondary | ICD-10-CM

## 2013-10-03 DIAGNOSIS — Z0000 Encounter for general adult medical examination without abnormal findings: Secondary | ICD-10-CM

## 2013-10-03 DIAGNOSIS — Z23 Encounter for immunization: Secondary | ICD-10-CM

## 2013-10-03 DIAGNOSIS — K6289 Other specified diseases of anus and rectum: Secondary | ICD-10-CM

## 2013-10-03 DIAGNOSIS — J019 Acute sinusitis, unspecified: Secondary | ICD-10-CM | POA: Insufficient documentation

## 2013-10-03 DIAGNOSIS — J018 Other acute sinusitis: Secondary | ICD-10-CM

## 2013-10-03 DIAGNOSIS — H6980 Other specified disorders of Eustachian tube, unspecified ear: Secondary | ICD-10-CM

## 2013-10-03 DIAGNOSIS — H6992 Unspecified Eustachian tube disorder, left ear: Secondary | ICD-10-CM

## 2013-10-03 MED ORDER — ESCITALOPRAM OXALATE 10 MG PO TABS: 10.0000 mg | ORAL_TABLET | Freq: Every day | ORAL | Status: DC

## 2013-10-03 MED ORDER — AZITHROMYCIN 250 MG PO TABS: ORAL_TABLET | ORAL | Status: DC

## 2013-10-03 NOTE — Patient Instructions (Signed)
You had the flu shot today  Please take all new medication as prescribed - the antibiotic, and the lexapro  You can also take Delsym OTC for cough if needed, and/or Mucinex (or it's generic off brand) for congestion and ear discomfort, and tylenol as needed for pain.  Please continue all other medications as before  Please have the pharmacy call with any other refills you may need.  You will be contacted regarding the referral for: GI (Dr Carlean Purl)  Please keep your appointments with your specialists as you may have planned

## 2013-10-03 NOTE — Progress Notes (Signed)
Subjective:    Patient ID: Billy Morris, male    DOB: [birth year], 43 y.o.   MRN: [id number]  HPI  Here with 2-3 days acute onset fever, facial pain, pressure, headache, general weakness and malaise, and greenish d/c, with mild ST and cough, but pt denies chest pain, wheezing, increased sob or doe, orthopnea, PND, increased LE swelling, palpitations, dizziness or syncope., but also with left ear pain, pressure, popping, crackling Denies worsening depressive symptoms, suicidal ideation, or panic; has ongoing anxiety, some increased recently, already high most days, chronic persistent for years, ok with ssri trial.  Also with chronic recurrent anal pain, seems to have recurrent tearing sensation with BM, occas BRB then seems to heal, but keeps recurring.   Past Medical History  Diagnosis Date  . Hearing loss     chronic right  . Allergic rhinitis   . Asthma   . Anxiety   . GERD (gastroesophageal reflux disease)   . LBP (low back pain)   . H. pylori infection   . Hyperlipidemia   . Anal fissure    Past Surgical History  Procedure Laterality Date  . Vasectomy      reports that he has never smoked. He has never used smokeless tobacco. He reports that he does not drink alcohol or use illicit drugs. family history includes Allergies in his other; Asthma in his son; COPD in his maternal grandfather; Heart attack in his maternal grandmother; Heart failure in his other; Hyperlipidemia in his father and mother; Other in his mother and other; Prostate cancer in his maternal grandfather; Stroke in his maternal aunt. Allergies  Allergen Reactions  . Ciprofloxacin Other (See Comments)    Stools have bloody mucous  . Tiotropium Bromide Monohydrate Other (See Comments)    heart racing and paresthesias  . Aspirin   . Nabumetone     weakness  . Penicillins   . Sulfonamide Derivatives    Current Outpatient Prescriptions on File Prior to Visit  Medication Sig Dispense Refill  . cyclobenzaprine  (FLEXERIL) 5 MG tablet Take 1 tablet (5 mg total) by mouth 3 (three) times daily as needed for muscle spasms.  30 tablet  0  . fluticasone (FLONASE) 50 MCG/ACT nasal spray Place 2 sprays into both nostrils daily.  16 g  6  . HYDROcodone-acetaminophen (NORCO/VICODIN) 5-325 MG per tablet Take 1-2 tablets by mouth every 6 (six) hours as needed.  60 tablet  0  . hydrocortisone (PROCTOSOL HC) 2.5 % rectal cream Place rectally 2 (two) times daily.  30 g  0  . ibuprofen (ADVIL) 200 MG tablet Take 200 mg by mouth every 6 (six) hours as needed.        . lansoprazole (PREVACID) 30 MG capsule TAKE ONE CAPSULE BY MOUTH TWICE DAILY  60 capsule  11  . levocetirizine (XYZAL) 5 MG tablet TAKE ONE TABLET BY MOUTH ONCE DAILY FOR ALLERGY  30 tablet  6  . montelukast (SINGULAIR) 10 MG tablet TAKE ONE TABLET BY MOUTH ONCE DAILY.  30 tablet  11  . naproxen (NAPROSYN) 500 MG tablet Take 1 tablet (500 mg total) by mouth 2 (two) times daily.  30 tablet  0  . PROAIR HFA 108 (90 BASE) MCG/ACT inhaler INHALE TWO PUFFS 4 TIMES DAILY AS NEEDED FOR SHORTNESS OF BREATH  1 each  6  . QVAR 80 MCG/ACT inhaler INHALE TWO PUFFS TWICE DAILY  1 Inhaler  9   No current facility-administered medications on file prior to visit.  Review of Systems  Constitutional: Negative for unusual diaphoresis or other sweats  HENT: Negative for ringing in ear Eyes: Negative for double vision or worsening visual disturbance.  Respiratory: Negative for choking and stridor.   Gastrointestinal: Negative for vomiting or other signifcant bowel change Genitourinary: Negative for hematuria or decreased urine volume.  Musculoskeletal: Negative for other MSK pain or swelling Skin: Negative for color change and worsening wound.  Neurological: Negative for tremors and numbness other than noted  Psychiatric/Behavioral: Negative for decreased concentration or agitation other than above       Objective:   Physical Exam BP 120/80  Pulse 76  Temp(Src) 98.6  F (37 C) (Oral)  Wt 201 lb 4 oz (91.286 kg)  SpO2 98% VS noted,  Constitutional: Pt appears well-developed, well-nourished.  HENT: Head: NCAT.  Right Ear: External ear normal.  Left Ear: External ear normal.  Eyes: . Pupils are equal, round, and reactive to light. Conjunctivae and EOM are normal Neck: Normal range of motion. Neck supple.  Bilat tm's with mild erythema.  Max sinus areas mild tender.  Pharynx with mild erythema, no exudate Cardiovascular: Normal rate and regular rhythm.   Pulmonary/Chest: Effort normal and breath sounds normal.  Abd:  Soft, NT, ND, + BS DRE: deferred per pt Neurological: Pt is alert. Not confused , motor grossly intact Skin: Skin is warm. No rash Psychiatric: Pt behavior is normal. No agitation. 2+ nervous    Assessment & Plan:

## 2013-10-03 NOTE — Assessment & Plan Note (Signed)
Also for mucinex otc prn 

## 2013-10-03 NOTE — Assessment & Plan Note (Signed)
Also for GI referral, stool softner prn

## 2013-10-03 NOTE — Assessment & Plan Note (Signed)
To start lexapro 10 qd,  to f/u any worsening symptoms or concerns

## 2013-10-03 NOTE — Assessment & Plan Note (Signed)
Mild to mod, for antibx course,  to f/u any worsening symptoms or concerns 

## 2013-10-03 NOTE — Progress Notes (Signed)
Pre visit review using our clinic review tool, if applicable. No additional management support is needed unless otherwise documented below in the visit note. 

## 2013-10-06 ENCOUNTER — Encounter: Payer: Self-pay | Admitting: Internal Medicine

## 2013-10-11 ENCOUNTER — Other Ambulatory Visit (INDEPENDENT_AMBULATORY_CARE_PROVIDER_SITE_OTHER): Payer: BC Managed Care – PPO

## 2013-10-11 DIAGNOSIS — Z0000 Encounter for general adult medical examination without abnormal findings: Secondary | ICD-10-CM

## 2013-10-11 LAB — LIPID PANEL
Cholesterol: 206 mg/dL — ABNORMAL HIGH (ref 0–200)
HDL: 50.6 mg/dL (ref >39.00)
LDL Cholesterol: 134 mg/dL — ABNORMAL HIGH (ref 0–99)
NonHDL: 155.4
Total CHOL/HDL Ratio: 4
Triglycerides: 105 mg/dL (ref 0.0–149.0)
VLDL: 21 mg/dL (ref 0.0–40.0)

## 2013-10-11 LAB — URINALYSIS, ROUTINE W REFLEX MICROSCOPIC
BILIRUBIN URINE: NEGATIVE
Hgb urine dipstick: NEGATIVE
Ketones, ur: NEGATIVE
LEUKOCYTES UA: NEGATIVE
Nitrite: NEGATIVE
SPECIFIC GRAVITY, URINE: 1.02 (ref 1.000–1.030)
Total Protein, Urine: NEGATIVE
Urine Glucose: NEGATIVE
Urobilinogen, UA: 0.2 (ref 0.0–1.0)
pH: 5.5 (ref 5.0–8.0)

## 2013-10-11 LAB — CBC WITH DIFFERENTIAL/PLATELET
BASOS ABS: 0 10*3/uL (ref 0.0–0.1)
Basophils Relative: 0.8 % (ref 0.0–3.0)
Eosinophils Absolute: 0.1 10*3/uL (ref 0.0–0.7)
Eosinophils Relative: 2 % (ref 0.0–5.0)
HEMATOCRIT: 42.8 % (ref 39.0–52.0)
Hemoglobin: 14.6 g/dL (ref 13.0–17.0)
Lymphocytes Relative: 38.8 % (ref 12.0–46.0)
Lymphs Abs: 2.2 10*3/uL (ref 0.7–4.0)
MCHC: 34.2 g/dL (ref 30.0–36.0)
MCV: 92.3 fl (ref 78.0–100.0)
MONOS PCT: 9.2 % (ref 3.0–12.0)
Monocytes Absolute: 0.5 10*3/uL (ref 0.1–1.0)
Neutro Abs: 2.8 10*3/uL (ref 1.4–7.7)
Neutrophils Relative %: 49.2 % (ref 43.0–77.0)
PLATELETS: 249 10*3/uL (ref 150.0–400.0)
RBC: 4.64 Mil/uL (ref 4.22–5.81)
RDW: 12.4 % (ref 11.5–15.5)
WBC: 5.8 10*3/uL (ref 4.0–10.5)

## 2013-10-11 LAB — HEPATIC FUNCTION PANEL
ALBUMIN: 3.9 g/dL (ref 3.5–5.2)
ALK PHOS: 73 U/L (ref 39–117)
ALT: 21 U/L (ref 0–53)
AST: 19 U/L (ref 0–37)
BILIRUBIN DIRECT: 0.1 mg/dL (ref 0.0–0.3)
TOTAL PROTEIN: 6.7 g/dL (ref 6.0–8.3)
Total Bilirubin: 1.2 mg/dL (ref 0.2–1.2)

## 2013-10-11 LAB — BASIC METABOLIC PANEL
BUN: 12 mg/dL (ref 6–23)
CO2: 24 meq/L (ref 19–32)
Calcium: 9 mg/dL (ref 8.4–10.5)
Chloride: 104 mEq/L (ref 96–112)
Creatinine, Ser: 0.8 mg/dL (ref 0.4–1.5)
GFR: 107.36 mL/min (ref >60.00)
Glucose, Bld: 92 mg/dL (ref 70–99)
POTASSIUM: 3.4 meq/L — AB (ref 3.5–5.1)
SODIUM: 136 meq/L (ref 135–145)

## 2013-10-11 LAB — PSA: PSA: 0.39 ng/mL (ref 0.10–4.00)

## 2013-10-11 LAB — TSH: TSH: 1.54 u[IU]/mL (ref 0.35–4.50)

## 2013-10-17 ENCOUNTER — Encounter: Payer: Self-pay | Admitting: Pulmonary Disease

## 2013-10-17 ENCOUNTER — Ambulatory Visit (INDEPENDENT_AMBULATORY_CARE_PROVIDER_SITE_OTHER): Payer: BC Managed Care – PPO | Admitting: Pulmonary Disease

## 2013-10-17 VITALS — BP 128/78 | HR 67 | Temp 98.6°F | Ht 66.0 in | Wt 205.6 lb

## 2013-10-17 DIAGNOSIS — J4520 Mild intermittent asthma, uncomplicated: Secondary | ICD-10-CM

## 2013-10-17 DIAGNOSIS — J45909 Unspecified asthma, uncomplicated: Secondary | ICD-10-CM

## 2013-10-17 NOTE — Patient Instructions (Signed)
No change in asthma and allergy medications Stay active and work on weight loss followup with me again in one year.

## 2013-10-17 NOTE — Progress Notes (Signed)
   Subjective:    Patient ID: Billy Morris, male    DOB: October 15, 1970, 43 y.o.   MRN: [id number]  HPI The patient comes in today for followup of his known asthma. He has been staying on his maintenance medications, and has had no acute exacerbation or pulmonary infection. He did have a recent URI with some increased symptoms, but responded well to as needed albuterol. He has not used it since that time. He feels that his breathing is at baseline.   Review of Systems  Constitutional: Negative for fever and unexpected weight change.  HENT: Negative for congestion, dental problem, ear pain, nosebleeds, postnasal drip, rhinorrhea, sinus pressure, sneezing, sore throat and trouble swallowing.   Eyes: Negative for redness and itching.  Respiratory: Negative for cough, chest tightness, shortness of breath and wheezing.   Cardiovascular: Negative for palpitations and leg swelling.  Gastrointestinal: Negative for nausea and vomiting.  Genitourinary: Negative for dysuria.  Musculoskeletal: Negative for joint swelling.  Skin: Negative for rash.  Neurological: Negative for headaches.  Hematological: Does not bruise/bleed easily.  Psychiatric/Behavioral: Negative for dysphoric mood. The patient is not nervous/anxious.        Objective:   Physical Exam Overweight male in no acute distress Nose without purulence or discharge noted Neck without lymphadenopathy or thyromegaly Chest totally clear to auscultation, no wheezing Cardiac exam with regular rate and rhythm Lower extremities with no significant edema, no cyanosis Alert and oriented, moves all 4 extremities.       Assessment & Plan:

## 2013-10-17 NOTE — Assessment & Plan Note (Signed)
The patient is doing well from an asthma standpoint on his current regimen, and I've asked him to continue this. He is also stay on his medications for his allergic rhinitis, especially with the upcoming fall allergy season. He has only gotten his flu shot for this year.

## 2013-10-19 ENCOUNTER — Ambulatory Visit: Payer: BC Managed Care – PPO | Admitting: Internal Medicine

## 2013-10-23 ENCOUNTER — Encounter: Payer: Self-pay | Admitting: Internal Medicine

## 2013-10-23 ENCOUNTER — Ambulatory Visit (INDEPENDENT_AMBULATORY_CARE_PROVIDER_SITE_OTHER): Payer: BC Managed Care – PPO | Admitting: Internal Medicine

## 2013-10-23 VITALS — BP 122/82 | HR 60 | Ht 65.5 in | Wt 202.5 lb

## 2013-10-23 DIAGNOSIS — K5900 Constipation, unspecified: Secondary | ICD-10-CM

## 2013-10-23 DIAGNOSIS — K602 Anal fissure, unspecified: Secondary | ICD-10-CM

## 2013-10-23 MED ORDER — PSYLLIUM 28 % PO PACK: 1.0000 | PACK | Freq: Two times a day (BID) | ORAL | Status: DC

## 2013-10-23 MED ORDER — DILTIAZEM GEL 2 %: CUTANEOUS | Status: DC

## 2013-10-23 NOTE — Patient Instructions (Addendum)
We have sent the following medications to [facility] for you to pick up at your convenience: Diltiazem gel  Mix 2 tablespoons of Metamucil in 14 ounces of water daily

## 2013-10-23 NOTE — Progress Notes (Signed)
HISTORY OF PRESENT ILLNESS:  Billy Morris is a 43 y.o. male with a history of anal fissure who presents today, upon referral from his primary care physician Dr. Jenny Reichmann, regarding management of the same. Patient reports long-standing history of intermittent problems with anal fissures manifested by pain and bleeding. He did undergo colonoscopy and upper endoscopy in November of 2011. Examination was normal except for diverticulosis. He now reports worsening problems with dysphagia over the past 6-8 months. As well, tendency toward constipation  REVIEW OF SYSTEMS:  All non-GI ROS negative except for sinus and allergy, anxiety, back pain, visual change, fatigue, itching, rash  Past Medical History  Diagnosis Date  . Hearing loss     chronic right  . Allergic rhinitis   . Asthma   . Anxiety   . GERD (gastroesophageal reflux disease)   . LBP (low back pain)   . H. pylori infection   . Hyperlipidemia   . Anal fissure   . Hiatal hernia   . Diverticulosis   . COPD (chronic obstructive pulmonary disease)     Past Surgical History  Procedure Laterality Date  . Vasectomy    . Stapedectomy      Social History Billy Morris  reports that he has never smoked. He has never used smokeless tobacco. He reports that he does not drink alcohol or use illicit drugs.  family history includes Allergies in his other; Asthma in his son; COPD in his maternal grandfather; Colon polyps in his maternal grandfather; Diabetes in his maternal grandmother; Heart attack in his maternal grandmother; Heart failure in his other; Hyperlipidemia in his father and mother; Irritable bowel syndrome in his maternal aunt; Other in his mother; Ovarian cancer in his paternal grandmother; Prostate cancer in his maternal grandfather; Stroke in his maternal aunt.  Allergies  Allergen Reactions  . Ciprofloxacin Other (See Comments)    Stools have bloody mucous  . Tiotropium Bromide Monohydrate Other (See Comments)    heart racing  and paresthesias  . Aspirin   . Nabumetone     weakness  . Penicillins   . Sulfonamide Derivatives        PHYSICAL EXAMINATION: Vital signs: BP 122/82  Pulse 60  Ht 5' 5.5" (1.664 m)  Wt 202 lb 8 oz (91.853 kg)  BMI 33.17 kg/m2  Constitutional: generally well-appearing, no acute distress Psychiatric: alert and oriented x3, cooperative Eyes: extraocular movements intact, anicteric, conjunctiva pink Mouth: oral pharynx moist, no lesions Neck: supple no lymphadenopathy Cardiovascular: heart regular rate and rhythm, no murmur Lungs: clear to auscultation bilaterally Abdomen: soft, nontender, nondistended, no obvious ascites, no peritoneal signs, normal bowel sounds, no organomegaly Rectal: Left lateral fissure Extremities: no lower extremity edema bilaterally Skin: no lesions on visible extremities Neuro: No focal deficits.  ASSESSMENT:  #1. Anal fissure #2. Constipation #3. History of nonulcer dyspepsia #4. Colonoscopy 2011 with diverticulosis #5. Upper endoscopy 2011 normal   PLAN:  #1. Metamucil 2 tablespoons daily and 14 ounces of water #2. 2% diltiazem cream 5 times daily prescribed #3. Sitz baths #4. Followup as needed. If problems refractory to medical therapy, consider surgical opinion. #5. Colonoscopy at age 25 for routine screening

## 2013-11-01 ENCOUNTER — Ambulatory Visit (INDEPENDENT_AMBULATORY_CARE_PROVIDER_SITE_OTHER): Payer: BC Managed Care – PPO | Admitting: Internal Medicine

## 2013-11-01 ENCOUNTER — Ambulatory Visit (INDEPENDENT_AMBULATORY_CARE_PROVIDER_SITE_OTHER)
Admission: RE | Admit: 2013-11-01 | Discharge: 2013-11-01 | Disposition: A | Discharge status: Home / Self Care | Payer: BC Managed Care – PPO | Source: Ambulatory Visit | Attending: Internal Medicine | Admitting: Internal Medicine

## 2013-11-01 ENCOUNTER — Encounter: Payer: Self-pay | Admitting: Internal Medicine

## 2013-11-01 VITALS — BP 120/82 | HR 75 | Temp 98.6°F | Ht 66.0 in | Wt 205.1 lb

## 2013-11-01 DIAGNOSIS — Z0000 Encounter for general adult medical examination without abnormal findings: Secondary | ICD-10-CM

## 2013-11-01 DIAGNOSIS — M79671 Pain in right foot: Secondary | ICD-10-CM

## 2013-11-01 DIAGNOSIS — M79609 Pain in unspecified limb: Secondary | ICD-10-CM

## 2013-11-01 MED ORDER — INDOMETHACIN 50 MG PO CAPS: 50.0000 mg | ORAL_CAPSULE | Freq: Three times a day (TID) | ORAL | Status: DC | PRN

## 2013-11-01 NOTE — Assessment & Plan Note (Signed)
Mod right mid foot pain - ? Tendonitis, vs trauma , fx or gout? For film today, and indocin prn,  to f/u any worsening symptoms or concerns

## 2013-11-01 NOTE — Progress Notes (Signed)
Pre visit review using our clinic review tool, if applicable. No additional management support is needed unless otherwise documented below in the visit note. 

## 2013-11-01 NOTE — Progress Notes (Signed)
Subjective:    Patient ID: Billy Morris, male    DOB: [birth year], 43 y.o.   MRN: [id number]  HPI  Here for wellness and f/u;  Overall doing ok;  Pt denies CP, worsening SOB, DOE, wheezing, orthopnea, PND, worsening LE edema, palpitations, dizziness or syncope.  Pt denies neurological change such as new headache, facial or extremity weakness.  Pt denies polydipsia, polyuria, or low sugar symptoms. Pt states overall good compliance with treatment and medications, good tolerability, and has been trying to follow lower cholesterol diet.  Pt denies worsening depressive symptoms, suicidal ideation or panic. No fever, night sweats, wt loss, loss of appetite, or other constitutional symptoms.  Pt states good ability with ADL's, has low fall risk, home safety reviewed and adequate, no other significant changes in hearing or vision, and only occasionally active with exercise.  Father with afib - more stress recently.   Past Medical History  Diagnosis Date  . Hearing loss     chronic right  . Allergic rhinitis   . Asthma   . Anxiety   . GERD (gastroesophageal reflux disease)   . LBP (low back pain)   . H. pylori infection   . Hyperlipidemia   . Anal fissure   . Hiatal hernia   . Diverticulosis   . COPD (chronic obstructive pulmonary disease)    Past Surgical History  Procedure Laterality Date  . Vasectomy    . Stapedectomy      reports that he has never smoked. He has never used smokeless tobacco. He reports that he does not drink alcohol or use illicit drugs. family history includes Allergies in his other; Asthma in his son; COPD in his maternal grandfather; Colon polyps in his maternal grandfather; Diabetes in his maternal grandmother; Heart attack in his maternal grandmother; Heart failure in his other; Hyperlipidemia in his father and mother; Irritable bowel syndrome in his maternal aunt; Other in his mother; Ovarian cancer in his paternal grandmother; Prostate cancer in his maternal  grandfather; Stroke in his maternal aunt. Allergies  Allergen Reactions  . Ciprofloxacin Other (See Comments)    Stools have bloody mucous  . Tiotropium Bromide Monohydrate Other (See Comments)    heart racing and paresthesias  . Aspirin   . Nabumetone     weakness  . Penicillins   . Sulfonamide Derivatives    Current Outpatient Prescriptions on File Prior to Visit  Medication Sig Dispense Refill  . cyclobenzaprine (FLEXERIL) 5 MG tablet Take 1 tablet (5 mg total) by mouth 3 (three) times daily as needed for muscle spasms.  30 tablet  0  . diltiazem 2 % GEL Apply to anal sphincter five times a day  1 Package  3  . fluticasone (FLONASE) 50 MCG/ACT nasal spray Place 2 sprays into both nostrils daily.  16 g  6  . HYDROcodone-acetaminophen (NORCO/VICODIN) 5-325 MG per tablet Take 1-2 tablets by mouth every 6 (six) hours as needed.  60 tablet  0  . hydrocortisone (PROCTOSOL HC) 2.5 % rectal cream Place rectally 2 (two) times daily.  30 g  0  . ibuprofen (ADVIL) 200 MG tablet Take 200 mg by mouth every 6 (six) hours as needed.        . lansoprazole (PREVACID) 30 MG capsule TAKE ONE CAPSULE BY MOUTH TWICE DAILY  60 capsule  11  . levocetirizine (XYZAL) 5 MG tablet TAKE ONE TABLET BY MOUTH ONCE DAILY FOR ALLERGY  30 tablet  6  . montelukast (SINGULAIR) 10 MG  tablet TAKE ONE TABLET BY MOUTH ONCE DAILY.  30 tablet  11  . naproxen (NAPROSYN) 500 MG tablet Take 1 tablet (500 mg total) by mouth 2 (two) times daily.  30 tablet  0  . PROAIR HFA 108 (90 BASE) MCG/ACT inhaler INHALE TWO PUFFS 4 TIMES DAILY AS NEEDED FOR SHORTNESS OF BREATH  1 each  6  . psyllium (METAMUCIL SMOOTH TEXTURE) 28 % packet Take 1 packet by mouth 2 (two) times daily.  1 packet  0  . QVAR 80 MCG/ACT inhaler INHALE TWO PUFFS TWICE DAILY  1 Inhaler  9   No current facility-administered medications on file prior to visit.   Review of Systems Constitutional: Negative for increased diaphoresis, other activity, appetite or other  siginficant weight change  HENT: Negative for worsening hearing loss, ear pain, facial swelling, mouth sores and neck stiffness.   Eyes: Negative for other worsening pain, redness or visual disturbance.  Respiratory: Negative for shortness of breath and wheezing.   Cardiovascular: Negative for chest pain and palpitations.  Gastrointestinal: Negative for diarrhea, blood in stool, abdominal distention or other pain Genitourinary: Negative for hematuria, flank pain or change in urine volume.  Musculoskeletal: Negative for myalgias or other joint complaints.  Skin: Negative for color change and wound.  Neurological: Negative for syncope and numbness. other than noted Hematological: Negative for adenopathy. or other swelling Psychiatric/Behavioral: Negative for hallucinations, self-injury, decreased concentration or other worsening agitation.      Objective:   Physical Exam BP 120/82  Pulse 75  Temp(Src) 98.6 F (37 C) (Oral)  Ht 5\' 6"  (1.676 m)  Wt 205 lb 2 oz (93.044 kg)  BMI 33.12 kg/m2  SpO2 97% VS noted,  Constitutional: Pt is oriented to person, place, and time. Appears well-developed and well-nourished.  Head: Normocephalic and atraumatic.  Right Ear: External ear normal.  Left Ear: External ear normal.  Nose: Nose normal.  Mouth/Throat: Oropharynx is clear and moist.  Eyes: Conjunctivae and EOM are normal. Pupils are equal, round, and reactive to light.  Neck: Normal range of motion. Neck supple. No JVD present. No tracheal deviation present.  Cardiovascular: Normal rate, regular rhythm, normal heart sounds and intact distal pulses.   Pulmonary/Chest: Effort normal and breath sounds without rales or wheezing  Abdominal: Soft. Bowel sounds are normal. NT. No HSM  Musculoskeletal: Normal range of motion. Exhibits no edema.  Lymphadenopathy:  Has no cervical adenopathy.  Neurological: Pt is alert and oriented to person, place, and time. Pt has normal reflexes. No cranial nerve  deficit. Motor grossly intact Skin: Skin is warm and dry. No rash noted.  Psychiatric:  Has normal mood and affect. Behavior is normal.  Right foot with 2-3+ swelling, mild to mod dorsal mid and distal tender without erythema, ? Worse at base of 2nd toe o/w neurovasc intact    Assessment & Plan:

## 2013-11-01 NOTE — Assessment & Plan Note (Signed)

## 2013-11-01 NOTE — Patient Instructions (Addendum)
Please take all new medication as prescribed - the indomethacin as needed for pain and swelling  If the xray is negative and the medication does not seem to help, please make an Appointment with Dr Smith/Sport medicine in this office (no referral needed)  Please continue all other medications as before, and refills have been done if requested.  Please have the pharmacy call with any other refills you may need.  Please continue your efforts at being more active, low cholesterol diet, and weight control.  You are otherwise up to date with prevention measures today.  Please keep your appointments with your specialists as you may have planned  Please go to the XRAY Department in the Basement (go straight as you get off the elevator) for the x-ray testing - for the foot  You will be contacted by phone if any changes need to be made immediately.  Otherwise, you will receive a letter about your results with an explanation, but please check with MyChart first.  Please remember to sign up for MyChart if you have not done so, as this will be important to you in the future with finding out test results, communicating by private email, and scheduling acute appointments online when needed.  Please return in 1 year for your yearly visit, or sooner if needed, with Lab testing done 3-5 days before

## 2013-11-02 ENCOUNTER — Telehealth: Payer: Self-pay | Admitting: *Deleted

## 2013-11-02 MED ORDER — INDOMETHACIN 50 MG PO CAPS: 50.0000 mg | ORAL_CAPSULE | Freq: Three times a day (TID) | ORAL | Status: DC | PRN

## 2013-11-02 NOTE — Telephone Encounter (Signed)
Left [facility] on triage stating md sent his medications to [facility] instead of [facility]. Requesting med to be sent to [facility]...Johny [facility]ss

## 2013-11-10 ENCOUNTER — Encounter: Payer: BC Managed Care – PPO | Admitting: Internal Medicine

## 2013-12-16 ENCOUNTER — Other Ambulatory Visit: Payer: Self-pay | Admitting: Pulmonary Disease

## 2014-01-19 ENCOUNTER — Other Ambulatory Visit: Payer: Self-pay | Admitting: Internal Medicine

## 2014-03-15 ENCOUNTER — Ambulatory Visit (INDEPENDENT_AMBULATORY_CARE_PROVIDER_SITE_OTHER): Payer: BLUE CROSS/BLUE SHIELD | Admitting: Internal Medicine

## 2014-03-15 ENCOUNTER — Other Ambulatory Visit: Payer: Self-pay | Admitting: Internal Medicine

## 2014-03-15 ENCOUNTER — Encounter: Payer: Self-pay | Admitting: Internal Medicine

## 2014-03-15 VITALS — BP 122/80 | HR 70 | Temp 99.1°F | Ht 66.0 in | Wt 200.2 lb

## 2014-03-15 DIAGNOSIS — J309 Allergic rhinitis, unspecified: Secondary | ICD-10-CM

## 2014-03-15 DIAGNOSIS — M545 Low back pain, unspecified: Secondary | ICD-10-CM

## 2014-03-15 DIAGNOSIS — J069 Acute upper respiratory infection, unspecified: Secondary | ICD-10-CM | POA: Insufficient documentation

## 2014-03-15 MED ORDER — AZITHROMYCIN 250 MG PO TABS: ORAL_TABLET | ORAL | Status: DC

## 2014-03-15 MED ORDER — MELOXICAM 15 MG PO TABS: 15.0000 mg | ORAL_TABLET | Freq: Every day | ORAL | Status: DC

## 2014-03-15 MED ORDER — FLUTICASONE PROPIONATE 50 MCG/ACT NA SUSP: 2.0000 | Freq: Every day | NASAL | Status: DC

## 2014-03-15 NOTE — Patient Instructions (Signed)
Please take all new medication as prescribed - the antibiotic, flonase and mobic (the anti-inflammatory for pain)  You can also take Delsym OTC for cough, Sudafed for congestion or Mucinex (or it's generic off brand) for congestion, and tylenol as needed for pain.  Please continue all other medications as before, and refills have been done if requested.  Please have the pharmacy call with any other refills you may need.  Please keep your appointments with your specialists as you may have planned

## 2014-03-15 NOTE — Progress Notes (Signed)
Pre visit review using our clinic review tool, if applicable. No additional management support is needed unless otherwise documented below in the visit note. 

## 2014-03-16 NOTE — Assessment & Plan Note (Signed)
Mild to mod, for antibx course,  to f/u any worsening symptoms or concerns 

## 2014-03-16 NOTE — Progress Notes (Signed)
Subjective:    Patient ID: Billy Morris, male    DOB: 1970-11-23, 44 y.o.   MRN: [id number]  HPI   Here with 4-5 days acute onset fever, facial pain, pressure, headache, general weakness and malaise, and greenish d/c, with mild ST and cough, but pt denies chest pain, wheezing, increased sob or doe, orthopnea, PND, increased LE swelling, palpitations, dizziness or syncope. Does have several wks ongoing nasal allergy symptoms with clearish congestion, itch and sneezing, without fever, pain, ST, cough, swelling or wheezing.  Pt continues to have recurring LBP without change in severity, bowel or bladder change, fever, wt loss,  worsening LE pain/numbness/weakness, gait change or falls.  Has recent known tear to right retina (details not clear), followed per optho with minor right lateral vision disturbance noted. By hx has seen 3 eye professionals, and mentions he may asks for a referral to other optho for another opinion Past Medical History  Diagnosis Date  . Hearing loss     chronic right  . Allergic rhinitis   . Asthma   . Anxiety   . GERD (gastroesophageal reflux disease)   . LBP (low back pain)   . H. pylori infection   . Hyperlipidemia   . Anal fissure   . Hiatal hernia   . Diverticulosis   . COPD (chronic obstructive pulmonary disease)    Past Surgical History  Procedure Laterality Date  . Vasectomy    . Stapedectomy      reports that he has never smoked. He has never used smokeless tobacco. He reports that he does not drink alcohol or use illicit drugs. family history includes Allergies in his other; Asthma in his son; COPD in his maternal grandfather; Colon polyps in his maternal grandfather; Diabetes in his maternal grandmother; Heart attack in his maternal grandmother; Heart failure in his other; Hyperlipidemia in his father and mother; Irritable bowel syndrome in his maternal aunt; Other in his mother; Ovarian cancer in his paternal grandmother; Prostate cancer in his maternal  grandfather; Stroke in his maternal aunt. Allergies  Allergen Reactions  . Ciprofloxacin Other (See Comments)    Stools have bloody mucous  . Tiotropium Bromide Monohydrate Other (See Comments)    heart racing and paresthesias  . Aspirin   . Nabumetone     weakness  . Penicillins   . Sulfonamide Derivatives   ' Current Outpatient Prescriptions on File Prior to Visit  Medication Sig Dispense Refill  . cyclobenzaprine (FLEXERIL) 5 MG tablet Take 1 tablet (5 mg total) by mouth 3 (three) times daily as needed for muscle spasms. 30 tablet 0  . diltiazem 2 % GEL Apply to anal sphincter five times a day 1 Package 3  . HYDROcodone-acetaminophen (NORCO/VICODIN) 5-325 MG per tablet Take 1-2 tablets by mouth every 6 (six) hours as needed. 60 tablet 0  . hydrocortisone (PROCTOSOL HC) 2.5 % rectal cream Place rectally 2 (two) times daily. 30 g 0  . ibuprofen (ADVIL) 200 MG tablet Take 200 mg by mouth every 6 (six) hours as needed.      . indomethacin (INDOCIN) 50 MG capsule Take 1 capsule (50 mg total) by mouth 3 (three) times daily as needed. 90 capsule 2  . levocetirizine (XYZAL) 5 MG tablet TAKE ONE TABLET BY MOUTH ONCE DAILY FOR  ALLERGY 30 tablet 5  . montelukast (SINGULAIR) 10 MG tablet TAKE ONE TABLET BY MOUTH ONCE DAILY 30 tablet 5  . PROAIR HFA 108 (90 BASE) MCG/ACT inhaler INHALE TWO PUFFS 4  TIMES DAILY AS NEEDED FOR SHORTNESS OF BREATH 1 each 6  . psyllium (METAMUCIL SMOOTH TEXTURE) 28 % packet Take 1 packet by mouth 2 (two) times daily. 1 packet 0  . QVAR 80 MCG/ACT inhaler INHALE TWO PUFFS TWICE DAILY 1 Inhaler 5   No current facility-administered medications on file prior to visit.   Review of Systems  Constitutional: Negative for unusual diaphoresis or other sweats  HENT: Negative for ringing in ear Eyes: Negative for double vision or worsening visual disturbance.  Respiratory: Negative for choking and stridor.   Gastrointestinal: Negative for vomiting or other signifcant bowel  change Genitourinary: Negative for hematuria or decreased urine volume.  Musculoskeletal: Negative for other MSK pain or swelling Skin: Negative for color change and worsening wound.  Neurological: Negative for tremors and numbness other than noted  Psychiatric/Behavioral: Negative for decreased concentration or agitation other than above       Objective:   Physical Exam BP 122/80 mmHg  Pulse 70  Temp(Src) 99.1 F (37.3 C) (Oral)  Ht 5\' 6"  (1.676 m)  Wt 200 lb 4 oz (90.833 kg)  BMI 32.34 kg/m2  SpO2 98% VS noted, mild ill Constitutional: Pt appears well-developed, well-nourished.  HENT: Head: NCAT.  Right Ear: External ear normal.  Left Ear: External ear normal.  Eyes: . Pupils are equal, round, and reactive to light. Conjunctivae and EOM are normal Bilat tm's with mild erythema.  Max sinus areas mild tender.  Pharynx with mild erythema, no exudate Neck: Normal range of motion. Neck supple.  Cardiovascular: Normal rate and regular rhythm.   Pulmonary/Chest: Effort normal and breath sounds without rales or wheezing.  Neurological: Pt is alert. Not confused , motor intact 5/5, normal dtr/sens/gait Skin: Skin is warm. No rash Psychiatric: Pt behavior is normal. No agitation.     Assessment & Plan:

## 2014-03-16 NOTE — Assessment & Plan Note (Signed)
No neuro changes, mild recurrent likely related to underlying djd or ddd, for mobic prn,  to f/u any worsening symptoms or concerns

## 2014-03-16 NOTE — Assessment & Plan Note (Signed)
Mild to mod, for flonase restart,  to f/u any worsening symptoms or concerns 

## 2014-03-28 ENCOUNTER — Ambulatory Visit (INDEPENDENT_AMBULATORY_CARE_PROVIDER_SITE_OTHER): Payer: BLUE CROSS/BLUE SHIELD | Admitting: Internal Medicine

## 2014-03-28 ENCOUNTER — Encounter: Payer: Self-pay | Admitting: Internal Medicine

## 2014-03-28 VITALS — BP 130/90 | HR 70 | Temp 98.6°F | Wt 201.0 lb

## 2014-03-28 DIAGNOSIS — J069 Acute upper respiratory infection, unspecified: Secondary | ICD-10-CM

## 2014-03-28 DIAGNOSIS — F411 Generalized anxiety disorder: Secondary | ICD-10-CM

## 2014-03-28 DIAGNOSIS — J4520 Mild intermittent asthma, uncomplicated: Secondary | ICD-10-CM

## 2014-03-28 MED ORDER — CLARITHROMYCIN 500 MG PO TABS: 500.0000 mg | ORAL_TABLET | Freq: Two times a day (BID) | ORAL | Status: DC

## 2014-03-28 NOTE — Assessment & Plan Note (Signed)
stable overall by history and exam, recent data reviewed with pt, and pt to continue medical treatment as before,  to f/u any worsening symptoms or concerns SpO2 Readings from Last 3 Encounters:  03/28/14 98%  03/15/14 98%  11/01/13 97%

## 2014-03-28 NOTE — Assessment & Plan Note (Signed)
stable overall by history and exam, recent data reviewed with pt, and pt to continue medical treatment as before,  to f/u any worsening symptoms or concerns Lab Results  Component Value Date   WBC 5.8 10/11/2013   HGB 14.6 10/11/2013   HCT 42.8 10/11/2013   PLT 249.0 10/11/2013   GLUCOSE 92 10/11/2013   CHOL 206* 10/11/2013   TRIG 105.0 10/11/2013   HDL 50.60 10/11/2013   LDLCALC 134* 10/11/2013   ALT 21 10/11/2013   AST 19 10/11/2013   NA 136 10/11/2013   K 3.4* 10/11/2013   CL 104 10/11/2013   CREATININE 0.8 10/11/2013   BUN 12 10/11/2013   CO2 24 10/11/2013   TSH 1.54 10/11/2013   PSA 0.39 10/11/2013

## 2014-03-28 NOTE — Patient Instructions (Signed)
Please take all new medication as prescribed - the antibiotic  You can also take Delsym OTC for cough, and/or Mucinex (or it's generic off brand) for congestion, and tylenol as needed for pain.  Please continue all other medications as before, and refills have been done if requested.  Please have the pharmacy call with any other refills you may need.  Please keep your appointments with your specialists as you may have planned    

## 2014-03-28 NOTE — Progress Notes (Signed)
Pre visit review using our clinic review tool, if applicable. No additional management support is needed unless otherwise documented below in the visit note. 

## 2014-03-28 NOTE — Assessment & Plan Note (Signed)
Recurrent new infeciton, Mild to mod, for antibx course,  to f/u any worsening symptoms or concerns

## 2014-03-28 NOTE — Progress Notes (Signed)
Subjective:    Patient ID: Billy Morris, male    DOB: 28-May-1970, 44 y.o.   MRN: [id number]  HPI   Here with 2-3 days acute onset fever, facial pain, pressure, headache, general weakness and malaise, and greenish d/c, with mild ST and cough, but pt denies chest pain, wheezing, increased sob or doe, orthopnea, PND, increased LE swelling, palpitations, dizziness or syncope.  Pt denies new neurological symptoms such as new headache, or facial or extremity weakness or numbness   Pt denies polydipsia, polyuria, Denies worsening depressive symptoms, suicidal ideation, or panic Past Medical History  Diagnosis Date  . Hearing loss     chronic right  . Allergic rhinitis   . Asthma   . Anxiety   . GERD (gastroesophageal reflux disease)   . LBP (low back pain)   . H. pylori infection   . Hyperlipidemia   . Anal fissure   . Hiatal hernia   . Diverticulosis   . COPD (chronic obstructive pulmonary disease)    Past Surgical History  Procedure Laterality Date  . Vasectomy    . Stapedectomy      reports that he has never smoked. He has never used smokeless tobacco. He reports that he does not drink alcohol or use illicit drugs. family history includes Allergies in his other; Asthma in his son; COPD in his maternal grandfather; Colon polyps in his maternal grandfather; Diabetes in his maternal grandmother; Heart attack in his maternal grandmother; Heart failure in his other; Hyperlipidemia in his father and mother; Irritable bowel syndrome in his maternal aunt; Other in his mother; Ovarian cancer in his paternal grandmother; Prostate cancer in his maternal grandfather; Stroke in his maternal aunt. Allergies  Allergen Reactions  . Ciprofloxacin Other (See Comments)    Stools have bloody mucous  . Tiotropium Bromide Monohydrate Other (See Comments)    heart racing and paresthesias  . Aspirin   . Nabumetone     weakness  . Penicillins   . Sulfonamide Derivatives    Current Outpatient  Prescriptions on File Prior to Visit  Medication Sig Dispense Refill  . cyclobenzaprine (FLEXERIL) 5 MG tablet Take 1 tablet (5 mg total) by mouth 3 (three) times daily as needed for muscle spasms. 30 tablet 0  . diltiazem 2 % GEL Apply to anal sphincter five times a day 1 Package 3  . fluticasone (FLONASE) 50 MCG/ACT nasal spray Place 2 sprays into both nostrils daily. 16 g 6  . HYDROcodone-acetaminophen (NORCO/VICODIN) 5-325 MG per tablet Take 1-2 tablets by mouth every 6 (six) hours as needed. 60 tablet 0  . hydrocortisone (PROCTOSOL HC) 2.5 % rectal cream Place rectally 2 (two) times daily. 30 g 0  . ibuprofen (ADVIL) 200 MG tablet Take 200 mg by mouth every 6 (six) hours as needed.      . indomethacin (INDOCIN) 50 MG capsule Take 1 capsule (50 mg total) by mouth 3 (three) times daily as needed. 90 capsule 2  . lansoprazole (PREVACID) 30 MG capsule TAKE ONE CAPSULE BY MOUTH TWICE DAILY 60 capsule 11  . levocetirizine (XYZAL) 5 MG tablet TAKE ONE TABLET BY MOUTH ONCE DAILY FOR  ALLERGY 30 tablet 5  . meloxicam (MOBIC) 15 MG tablet Take 1 tablet (15 mg total) by mouth daily. As needed 90 tablet 1  . montelukast (SINGULAIR) 10 MG tablet TAKE ONE TABLET BY MOUTH ONCE DAILY 30 tablet 5  . PROAIR HFA 108 (90 BASE) MCG/ACT inhaler INHALE TWO PUFFS 4 TIMES DAILY AS  NEEDED FOR SHORTNESS OF BREATH 1 each 6  . psyllium (METAMUCIL SMOOTH TEXTURE) 28 % packet Take 1 packet by mouth 2 (two) times daily. 1 packet 0  . QVAR 80 MCG/ACT inhaler INHALE TWO PUFFS TWICE DAILY 1 Inhaler 5   No current facility-administered medications on file prior to visit.   Review of Systems / Constitutional: Negative for unusual diaphoresis or other sweats  HENT: Negative for ringing in ear Eyes: Negative for double vision or worsening visual disturbance.  Respiratory: Negative for choking and stridor.   Gastrointestinal: Negative for vomiting or other signifcant bowel change Genitourinary: Negative for hematuria or  decreased urine volume.  Musculoskeletal: Negative for other MSK pain or swelling Skin: Negative for color change and worsening wound.  Neurological: Negative for tremors and numbness other than noted  Psychiatric/Behavioral: Negative for decreased concentration or agitation other than above       Objective:   Physical Exam BP 130/90 mmHg  Pulse 70  Temp(Src) 98.6 F (37 C) (Oral)  Wt 201 lb (91.173 kg)  SpO2 98% VS noted, mild ill Constitutional: Pt appears well-developed, well-nourished.  HENT: Head: NCAT.  Right Ear: External ear normal.  Left Ear: External ear normal.  Bilat tm's with mild erythema.  Max sinus areas mild tender.  Pharynx with mild erythema, no exudate Eyes: . Pupils are equal, round, and reactive to light. Conjunctivae and EOM are normal Neck: Normal range of motion. Neck supple.  Cardiovascular: Normal rate and regular rhythm.   Pulmonary/Chest: Effort normal and breath sounds without rales or wheezing.   Neurological: Pt is alert. Not confused , motor grossly intact Skin: Skin is warm. No rash Psychiatric: Pt behavior is normal. No agitation. mild nervous     Assessment & Plan:

## 2014-10-07 ENCOUNTER — Other Ambulatory Visit: Payer: Self-pay | Admitting: Internal Medicine

## 2014-10-18 ENCOUNTER — Ambulatory Visit: Payer: BC Managed Care – PPO | Admitting: Pulmonary Disease

## 2014-10-18 ENCOUNTER — Ambulatory Visit: Payer: BLUE CROSS/BLUE SHIELD | Admitting: Internal Medicine

## 2014-12-18 ENCOUNTER — Other Ambulatory Visit: Payer: Self-pay | Admitting: Internal Medicine

## 2015-01-31 ENCOUNTER — Encounter: Payer: Self-pay | Admitting: Internal Medicine

## 2015-01-31 ENCOUNTER — Ambulatory Visit (INDEPENDENT_AMBULATORY_CARE_PROVIDER_SITE_OTHER): Payer: BLUE CROSS/BLUE SHIELD | Admitting: Internal Medicine

## 2015-01-31 VITALS — BP 120/72 | HR 90 | Temp 98.0°F | Ht 66.0 in | Wt 199.0 lb

## 2015-01-31 DIAGNOSIS — F411 Generalized anxiety disorder: Secondary | ICD-10-CM | POA: Diagnosis not present

## 2015-01-31 DIAGNOSIS — Z23 Encounter for immunization: Secondary | ICD-10-CM

## 2015-01-31 DIAGNOSIS — J4520 Mild intermittent asthma, uncomplicated: Secondary | ICD-10-CM | POA: Diagnosis not present

## 2015-01-31 DIAGNOSIS — Z0000 Encounter for general adult medical examination without abnormal findings: Secondary | ICD-10-CM

## 2015-01-31 DIAGNOSIS — G5600 Carpal tunnel syndrome, unspecified upper limb: Secondary | ICD-10-CM | POA: Insufficient documentation

## 2015-01-31 DIAGNOSIS — G5603 Carpal tunnel syndrome, bilateral upper limbs: Secondary | ICD-10-CM

## 2015-01-31 MED ORDER — MELOXICAM 15 MG PO TABS: 15.0000 mg | ORAL_TABLET | Freq: Every day | ORAL | Status: DC

## 2015-01-31 NOTE — Progress Notes (Signed)
Pre visit review using our clinic review tool, if applicable. No additional management support is needed unless otherwise documented below in the visit note. 

## 2015-01-31 NOTE — Progress Notes (Signed)
Subjective:    Patient ID: Billy Morris, male    DOB: Jan 26, 1971, 44 y.o.   MRN: [id number]  HPI  Here for wellness and f/u;  Overall doing ok;  Pt denies Chest pain, worsening SOB, DOE, wheezing, orthopnea, PND, worsening LE edema, palpitations, dizziness or syncope.  Pt denies neurological change such as new headache, facial or extremity weakness.  Pt denies polydipsia, polyuria, or low sugar symptoms. Pt states overall good compliance with treatment and medications, good tolerability, and has been trying to follow appropriate diet.  Pt denies worsening depressive symptoms, suicidal ideation or panic. No fever, night sweats, wt loss, loss of appetite, or other constitutional symptoms.  Pt states good ability with ADL's, has low fall risk, home safety reviewed and adequate, no other significant changes in hearing or vision, and only occasionally active with exercise.  C/o bilat wrtist and hand tingling x 2 mo, had some prior but now worse with new job. 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 Worse to wake up in the AM.  Worse to driving with the left hand on the steering wheel.  Left handed. Denies worsening depressive symptoms, suicidal ideation, or panic; has ongoing at least mod anxiety, did not try the lexapro last yr.   Past Medical History  Diagnosis Date  . Hearing loss     chronic right  . Allergic rhinitis   . Asthma   . Anxiety   . GERD (gastroesophageal reflux disease)   . LBP (low back pain)   . H. pylori infection   . Hyperlipidemia   . Anal fissure   . Hiatal hernia   . Diverticulosis   . COPD (chronic obstructive pulmonary disease) [facility])    Past Surgical History  Procedure Laterality Date  . Vasectomy    . Stapedectomy      reports that he has never smoked. He has never used smokeless tobacco. He reports that he does not drink alcohol or use illicit drugs. family history includes Allergies in his other; Asthma  in his son; COPD in his maternal grandfather; Colon polyps in his maternal grandfather; Diabetes in his maternal grandmother; Heart attack in his maternal grandmother; Heart failure in his other; Hyperlipidemia in his father and mother; Irritable bowel syndrome in his maternal aunt; Other in his mother; Ovarian cancer in his paternal grandmother; Prostate cancer in his maternal grandfather; Stroke in his maternal aunt. Allergies  Allergen Reactions  . Ciprofloxacin Other (See Comments)    Stools have bloody mucous  . Tiotropium Bromide Monohydrate Other (See Comments)    heart racing and paresthesias  . Aspirin   . Nabumetone     weakness  . Penicillins   . Sulfonamide Derivatives    Current Outpatient Prescriptions on File Prior to Visit  Medication Sig Dispense Refill  . fluticasone (FLONASE) 50 MCG/ACT nasal spray Place 2 sprays into both nostrils daily. 16 g 6  . lansoprazole (PREVACID) 30 MG capsule TAKE ONE CAPSULE BY MOUTH TWICE DAILY 60 capsule 11  . levocetirizine (XYZAL) 5 MG tablet TAKE ONE TABLET BY MOUTH ONCE DAILY FOR  ALLERGY 30 tablet 2  . montelukast (SINGULAIR) 10 MG tablet TAKE ONE TABLET BY MOUTH ONCE DAILY 30 tablet 2  . PROAIR HFA 108 (90 BASE) MCG/ACT inhaler INHALE TWO PUFFS 4 TIMES DAILY AS NEEDED FOR SHORTNESS OF BREATH 1 each 6  . diltiazem 2 % GEL Apply to anal sphincter five times a  day (Patient not taking: Reported on 01/31/2015) 1 Package 3  . hydrocortisone (PROCTOSOL HC) 2.5 % rectal cream Place rectally 2 (two) times daily. (Patient not taking: Reported on 01/31/2015) 30 g 0  . indomethacin (INDOCIN) 50 MG capsule Take 1 capsule (50 mg total) by mouth 3 (three) times daily as needed. (Patient not taking: Reported on 01/31/2015) 90 capsule 2  . QVAR 80 MCG/ACT inhaler INHALE TWO PUFFS TWICE DAILY (Patient not taking: Reported on 01/31/2015) 1 Inhaler 5   No current facility-administered medications on file prior to visit.    Review of  Systems Constitutional: Negative for increased diaphoresis, other activity, appetite or siginficant weight change other than noted HENT: Negative for worsening hearing loss, ear pain, facial swelling, mouth sores and neck stiffness.   Eyes: Negative for other worsening pain, redness or visual disturbance.  Respiratory: Negative for shortness of breath and wheezing  Cardiovascular: Negative for chest pain and palpitations.  Gastrointestinal: Negative for diarrhea, blood in stool, abdominal distention or other pain Genitourinary: Negative for hematuria, flank pain or change in urine volume.  Musculoskeletal: Negative for myalgias or other joint complaints.  Skin: Negative for color change and wound or drainage.  Neurological: Negative for syncope and numbness. other than noted Hematological: Negative for adenopathy. or other swelling Psychiatric/Behavioral: Negative for hallucinations, SI, self-injury, decreased concentration or other worsening agitation.      Objective:   Physical Exam BP 120/72 mmHg  Pulse 90  Temp(Src) 98 F (36.7 C) (Oral)  Ht 5\' 6"  (1.676 m)  Wt 199 lb (90.266 kg)  BMI 32.13 kg/m2  SpO2 99% VS noted,  Constitutional: Pt is oriented to person, place, and time. Appears well-developed and well-nourished, in no significant distress Head: Normocephalic and atraumatic.  Right Ear: External ear normal.  Left Ear: External ear normal.  Nose: Nose normal.  Mouth/Throat: Oropharynx is clear and moist.  Eyes: Conjunctivae and EOM are normal. Pupils are equal, round, and reactive to light.  Neck: Normal range of motion. Neck supple. No JVD present. No tracheal deviation present or significant neck LA or mass Cardiovascular: Normal rate, regular rhythm, normal heart sounds and intact distal pulses.   Pulmonary/Chest: Effort normal and breath sounds without rales or wheezing  Abdominal: Soft. Bowel sounds are normal. NT. No HSM  Musculoskeletal: Normal range of motion.  Exhibits no edema.  Lymphadenopathy:  Has no cervical adenopathy.  Neurological: Pt is alert and oriented to person, place, and time. Pt has normal reflexes. No cranial nerve deficit. Motor grossly intact Skin: Skin is warm and dry. No rash noted.  Psychiatric:  Has nervous mood and affect. Behavior is normal.     Assessment & Plan:

## 2015-01-31 NOTE — Patient Instructions (Signed)
You had the flu shot today  Please wear wrist splints to both wrists at night until the tingling is gone (these are at the pharmacies OTC)  Please continue all other medications as before, and refills have been done if requested.  Please have the pharmacy call with any other refills you may need.  Please continue your efforts at being more active, low cholesterol diet, and weight control.  You are otherwise up to date with prevention measures today.  Please keep your appointments with your specialists as you may have planned  Please go to the LAB in the Basement (turn left off the elevator) for the tests to be done today  You will be contacted by phone if any changes need to be made immediately.  Otherwise, you will receive a letter about your results with an explanation, but please check with MyChart first.  Please remember to sign up for MyChart if you have not done so, as this will be important to you in the future with finding out test results, communicating by private email, and scheduling acute appointments online when needed.  Please return in 1 year for your yearly visit, or sooner if needed, with Lab testing done 3-5 days before

## 2015-02-02 NOTE — Assessment & Plan Note (Signed)

## 2015-02-02 NOTE — Assessment & Plan Note (Signed)
stable overall by history and exam, recent data reviewed with pt, and pt to continue medical treatment as before,  to f/u any worsening symptoms or concerns SpO2 Readings from Last 3 Encounters:  01/31/15 99%  03/28/14 98%  03/15/14 98%

## 2015-02-02 NOTE — Assessment & Plan Note (Signed)
Persistent, Declines trial ssri, o/w stable overall by history and exam, and pt to continue medical treatment as before,  to f/u any worsening symptoms or concerns

## 2015-02-02 NOTE — Assessment & Plan Note (Signed)
Mild, for bilat wrist splints at night,  to f/u any worsening symptoms or concerns

## 2015-02-14 ENCOUNTER — Ambulatory Visit (INDEPENDENT_AMBULATORY_CARE_PROVIDER_SITE_OTHER): Payer: BLUE CROSS/BLUE SHIELD | Admitting: Internal Medicine

## 2015-02-14 ENCOUNTER — Encounter: Payer: Self-pay | Admitting: Internal Medicine

## 2015-02-14 DIAGNOSIS — J4520 Mild intermittent asthma, uncomplicated: Secondary | ICD-10-CM

## 2015-02-14 NOTE — Patient Instructions (Signed)
Continue the singulair  Only use your albuterol as a rescue medication to be used if you can't catch your breath by resting or doing a relaxed purse lip breathing pattern.  - The less you use it, the better it will work when you need it. - Ok to use up to 2 puffs  every 4 hours if you must but call for immediate appointment if use goes up over your usual need - Don't leave home without it !!  (think of it like the spare tire for your car)   Pulmonary follow up is as needed if you start needing the albuterol more than twice weekly on a regular basis

## 2015-02-14 NOTE — Progress Notes (Signed)
Subjective:     Patient ID: Billy Morris, male   DOB: 04-06-1970,    MRN: [id number]  HPI  60 yowm never smoker previously followed by Dr Gwenette Greet for asthma   02/14/2015  f/u ov/Billy Morris re:  Chief Complaint  Patient presents with  . Follow-up    Former patient of Dr Gwenette Greet. He stopped Qvar due to cost over the Summer 2016- since then having increased SOB and using rescue inhaler more-once per wk on average.   while on qvar and singulair still used some  albuterol (very vague on how much) > a little more abuterol since stopped qvar over 3 m prior to OV    Not limited by breathing from desired activities    No obvious day to day or daytime variability or assoc chronic cough or cp or chest tightness, subjective wheeze or overt sinus or hb symptoms. No unusual exp hx or h/o childhood pna/ asthma or knowledge of premature birth.  Sleeping ok without nocturnal  or early am exacerbation  of respiratory  c/o's or need for noct saba. Also denies any obvious fluctuation of symptoms with weather or environmental changes or other aggravating or alleviating factors except as outlined above   Current Medications, Allergies, Complete Past Medical History, Past Surgical History, Family History, and Social History were reviewed in [employer] record.  ROS  The following are not active complaints unless bolded sore throat, dysphagia, dental problems, itching, sneezing,  nasal congestion or excess/ purulent secretions, ear ache,   fever, chills, sweats, unintended wt loss, classically pleuritic or exertional cp, hemoptysis,  orthopnea pnd or leg swelling, presyncope, palpitations, abdominal pain, anorexia, nausea, vomiting, diarrhea  or change in bowel or bladder habits, change in stools or urine, dysuria,hematuria,  rash, arthralgias, visual complaints, headache, numbness, weakness or ataxia or problems with walking or coordination,  change in mood/affect or memory.         Review of  Systems     Objective:   Physical Exam amb anxious wm Patient failed to answer a single question asked in a straightforward manner, tending to go off on tangents or answer questions with ambiguous medical terms or diagnoses   Wt Readings from Last 3 Encounters:  02/14/15 200 lb (90.719 kg)  01/31/15 199 lb (90.266 kg)  03/28/14 201 lb (91.173 kg)    Vital signs reviewed / pseudowheeze eliminated by purse lip breathing    HEENT: nl dentition, turbinates, and oropharynx. Nl external ear canals without cough reflex   NECK :  without JVD/Nodes/TM/ nl carotid upstrokes bilaterally   LUNGS: no acc muscle use,  Nl contour chest which is clear to A and P bilaterally without cough on insp or exp maneuvers   CV:  RRR  no s3 or murmur or increase in P2, no edema   ABD:  soft and nontender with nl inspiratory excursion in the supine position. No bruits or organomegaly, bowel sounds nl  MS:  Nl gait/ ext warm without deformities, calf tenderness, cyanosis or clubbing No obvious joint restrictions   SKIN: warm and dry without lesions    NEURO:  alert, approp, nl sensorium with  no motor deficits          Assessment:

## 2015-02-18 ENCOUNTER — Encounter: Payer: Self-pay | Admitting: Internal Medicine

## 2015-02-18 NOTE — Assessment & Plan Note (Signed)
PFT's 2012:  FEV1 2.93 (82%), ratio 64, nl lung volumes and DLCO - spirometry  02/14/2015  FEV1  2.69 (73%) ratio 70 on singulair, no qvar x 3 m  Despite stopping qvar due to cost (it's one of the cheapest controlling meds available) All goals of chronic asthma control met including optimal function and elimination of symptoms with minimal need for rescue therapy.  Contingencies discussed in full including contacting this office immediately if not controlling the symptoms using the rule of two's.     I had an extended final summary  discussion with the patient reviewing all relevant studies completed to date and  lasting 15 to 20 minutes of a 25 minute visit    Each maintenance medication was reviewed in detail including most importantly the difference between maintenance and prns and under what circumstances the prns are to be triggered using an action plan format that is not reflected in the computer generated alphabetically organized AVS.    Please see instructions for details which were reviewed in writing and the patient given a copy highlighting the part that I personally wrote and discussed at today's ov.   Pulmonary f/u can be prn

## 2015-02-22 ENCOUNTER — Other Ambulatory Visit (INDEPENDENT_AMBULATORY_CARE_PROVIDER_SITE_OTHER): Payer: BLUE CROSS/BLUE SHIELD

## 2015-02-22 DIAGNOSIS — Z0000 Encounter for general adult medical examination without abnormal findings: Secondary | ICD-10-CM | POA: Diagnosis not present

## 2015-02-22 LAB — LIPID PANEL
Cholesterol: 186 mg/dL (ref 0–200)
HDL: 56.4 mg/dL (ref >39.00)
LDL Cholesterol: 121 mg/dL — ABNORMAL HIGH (ref 0–99)
NONHDL: 129.21
Total CHOL/HDL Ratio: 3
Triglycerides: 42 mg/dL (ref 0.0–149.0)
VLDL: 8.4 mg/dL (ref 0.0–40.0)

## 2015-02-22 LAB — BASIC METABOLIC PANEL
BUN: 16 mg/dL (ref 6–23)
CALCIUM: 9.5 mg/dL (ref 8.4–10.5)
CHLORIDE: 107 meq/L (ref 96–112)
CO2: 27 meq/L (ref 19–32)
Creatinine, Ser: 0.82 mg/dL (ref 0.40–1.50)
GFR: 108.19 mL/min (ref >60.00)
Glucose, Bld: 92 mg/dL (ref 70–99)
POTASSIUM: 4.3 meq/L (ref 3.5–5.1)
SODIUM: 141 meq/L (ref 135–145)

## 2015-02-22 LAB — URINALYSIS, ROUTINE W REFLEX MICROSCOPIC
Bilirubin Urine: NEGATIVE
Hgb urine dipstick: NEGATIVE
KETONES UR: NEGATIVE
Leukocytes, UA: NEGATIVE
Nitrite: NEGATIVE
PH: 6 (ref 5.0–8.0)
RBC / HPF: NONE SEEN (ref 0–?)
Specific Gravity, Urine: >=1.03 — AB (ref 1.000–1.030)
Total Protein, Urine: NEGATIVE
UROBILINOGEN UA: 0.2 (ref 0.0–1.0)
Urine Glucose: NEGATIVE

## 2015-02-22 LAB — CBC WITH DIFFERENTIAL/PLATELET
BASOS ABS: 0.1 10*3/uL (ref 0.0–0.1)
Basophils Relative: 0.8 % (ref 0.0–3.0)
Eosinophils Absolute: 0.2 10*3/uL (ref 0.0–0.7)
Eosinophils Relative: 3.3 % (ref 0.0–5.0)
HCT: 43.3 % (ref 39.0–52.0)
Hemoglobin: 14.4 g/dL (ref 13.0–17.0)
Lymphocytes Relative: 32 % (ref 12.0–46.0)
Lymphs Abs: 2 10*3/uL (ref 0.7–4.0)
MCHC: 33.4 g/dL (ref 30.0–36.0)
MCV: 92.3 fl (ref 78.0–100.0)
MONOS PCT: 8.8 % (ref 3.0–12.0)
Monocytes Absolute: 0.5 10*3/uL (ref 0.1–1.0)
Neutro Abs: 3.4 10*3/uL (ref 1.4–7.7)
Neutrophils Relative %: 55.1 % (ref 43.0–77.0)
PLATELETS: 270 10*3/uL (ref 150.0–400.0)
RBC: 4.69 Mil/uL (ref 4.22–5.81)
RDW: 12 % (ref 11.5–15.5)
WBC: 6.1 10*3/uL (ref 4.0–10.5)

## 2015-02-22 LAB — HEPATIC FUNCTION PANEL
ALK PHOS: 86 U/L (ref 39–117)
ALT: 28 U/L (ref 0–53)
AST: 20 U/L (ref 0–37)
Albumin: 4.2 g/dL (ref 3.5–5.2)
BILIRUBIN DIRECT: 0.2 mg/dL (ref 0.0–0.3)
TOTAL PROTEIN: 7.1 g/dL (ref 6.0–8.3)
Total Bilirubin: 0.9 mg/dL (ref 0.2–1.2)

## 2015-02-22 LAB — PSA: PSA: 0.33 ng/mL (ref 0.10–4.00)

## 2015-02-22 LAB — TSH: TSH: 1.41 u[IU]/mL (ref 0.35–4.50)

## 2015-04-06 ENCOUNTER — Other Ambulatory Visit: Payer: Self-pay | Admitting: Internal Medicine

## 2015-05-14 ENCOUNTER — Other Ambulatory Visit: Payer: Self-pay | Admitting: Internal Medicine

## 2015-06-19 ENCOUNTER — Other Ambulatory Visit: Payer: Self-pay | Admitting: Internal Medicine

## 2015-07-26 ENCOUNTER — Other Ambulatory Visit: Payer: Self-pay | Admitting: Internal Medicine

## 2015-08-14 ENCOUNTER — Encounter: Payer: Self-pay | Admitting: Internal Medicine

## 2015-08-14 ENCOUNTER — Ambulatory Visit (INDEPENDENT_AMBULATORY_CARE_PROVIDER_SITE_OTHER): Payer: BLUE CROSS/BLUE SHIELD | Admitting: Internal Medicine

## 2015-08-14 ENCOUNTER — Telehealth (INDEPENDENT_AMBULATORY_CARE_PROVIDER_SITE_OTHER): Payer: BLUE CROSS/BLUE SHIELD

## 2015-08-14 ENCOUNTER — Ambulatory Visit (INDEPENDENT_AMBULATORY_CARE_PROVIDER_SITE_OTHER)
Admission: RE | Admit: 2015-08-14 | Discharge: 2015-08-14 | Disposition: A | Discharge status: Home / Self Care | Payer: BLUE CROSS/BLUE SHIELD | Source: Ambulatory Visit | Attending: Internal Medicine | Admitting: Internal Medicine

## 2015-08-14 VITALS — BP 128/80 | HR 73 | Temp 98.5°F | Resp 20 | Wt 194.0 lb

## 2015-08-14 DIAGNOSIS — R0789 Other chest pain: Secondary | ICD-10-CM | POA: Diagnosis not present

## 2015-08-14 DIAGNOSIS — F411 Generalized anxiety disorder: Secondary | ICD-10-CM

## 2015-08-14 DIAGNOSIS — J4520 Mild intermittent asthma, uncomplicated: Secondary | ICD-10-CM

## 2015-08-14 DIAGNOSIS — R079 Chest pain, unspecified: Secondary | ICD-10-CM | POA: Diagnosis not present

## 2015-08-14 DIAGNOSIS — K219 Gastro-esophageal reflux disease without esophagitis: Secondary | ICD-10-CM | POA: Diagnosis not present

## 2015-08-14 DIAGNOSIS — J9811 Atelectasis: Secondary | ICD-10-CM | POA: Diagnosis not present
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
## 2015-08-14 NOTE — Telephone Encounter (Signed)
Orders placed.

## 2015-08-14 NOTE — Progress Notes (Signed)
Pre visit review using our clinic review tool, if applicable. No additional management support is needed unless otherwise documented below in the visit note. 

## 2015-08-14 NOTE — Patient Instructions (Signed)
Your EKG was Good today  Please continue all other medications as before  Please keep your appointments with your specialists as you may have planned  You also had the CXR earlier today  You will be contacted by phone if any changes need to be made immediately.  Otherwise, you will receive a letter about your results with an explanation, but please check with MyChart first.  Please remember to sign up for MyChart if you have not done so, as this will be important to you in the future with finding out test results, communicating by private email, and scheduling acute appointments online when needed.

## 2015-08-15 NOTE — Assessment & Plan Note (Signed)
Pt reassured, to cont same tx, declines need for counseling or psychiatric referral

## 2015-08-15 NOTE — Assessment & Plan Note (Signed)
stable overall by history and exam, recent data reviewed with pt, and pt to continue medical treatment as before,  to f/u any worsening symptoms or concerns SpO2 Readings from Last 3 Encounters:  08/14/15 99%  02/14/15 100%  01/31/15 99%

## 2015-08-15 NOTE — Assessment & Plan Note (Signed)
Atypical, etiology unclear, ECG without acute changes, no discomfort since episode last PM, suspect MSK, very low suspicion cardiac, cxr done and results pending, further tx pending results

## 2015-08-15 NOTE — Assessment & Plan Note (Signed)
stable overall by history and exam, recent data reviewed with pt, and pt to continue medical treatment as before,  to f/u any worsening symptoms or concerns Lab Results  Component Value Date   WBC 6.1 02/22/2015   HGB 14.4 02/22/2015   HCT 43.3 02/22/2015   PLT 270.0 02/22/2015   GLUCOSE 92 02/22/2015   CHOL 186 02/22/2015   TRIG 42.0 02/22/2015   HDL 56.40 02/22/2015   LDLCALC 121* 02/22/2015   ALT 28 02/22/2015   AST 20 02/22/2015   NA 141 02/22/2015   K 4.3 02/22/2015   CL 107 02/22/2015   CREATININE 0.82 02/22/2015   BUN 16 02/22/2015   CO2 27 02/22/2015   TSH 1.41 02/22/2015   PSA 0.33 02/22/2015

## 2015-08-23 ENCOUNTER — Encounter: Payer: Self-pay | Admitting: Internal Medicine

## 2015-09-04 ENCOUNTER — Other Ambulatory Visit: Payer: Self-pay | Admitting: Internal Medicine

## 2015-10-08 ENCOUNTER — Other Ambulatory Visit: Payer: Self-pay | Admitting: Internal Medicine

## 2015-11-01 DIAGNOSIS — T1502XA Foreign body in cornea, left eye, initial encounter: Secondary | ICD-10-CM | POA: Diagnosis not present

## 2015-11-19 ENCOUNTER — Ambulatory Visit (INDEPENDENT_AMBULATORY_CARE_PROVIDER_SITE_OTHER): Payer: BLUE CROSS/BLUE SHIELD | Admitting: Internal Medicine

## 2015-11-19 ENCOUNTER — Other Ambulatory Visit: Payer: Self-pay | Admitting: Internal Medicine

## 2015-11-19 ENCOUNTER — Encounter: Payer: Self-pay | Admitting: Internal Medicine

## 2015-11-19 VITALS — BP 128/72 | HR 70 | Temp 98.0°F | Resp 20 | Wt 198.0 lb

## 2015-11-19 DIAGNOSIS — R21 Rash and other nonspecific skin eruption: Secondary | ICD-10-CM | POA: Diagnosis not present

## 2015-11-19 MED ORDER — NYSTATIN 100000 UNIT/GM EX POWD: CUTANEOUS | 2 refills | Status: DC

## 2015-11-19 MED ORDER — KETOCONAZOLE 2 % EX CREA: 1.0000 "application " | TOPICAL_CREAM | Freq: Every day | CUTANEOUS | 1 refills | Status: DC

## 2015-11-19 MED ORDER — ALBUTEROL SULFATE HFA 108 (90 BASE) MCG/ACT IN AERS: INHALATION_SPRAY | RESPIRATORY_TRACT | 6 refills | Status: DC

## 2015-11-19 NOTE — Progress Notes (Signed)
Subjective:    Patient ID: Billy Morris, male    DOB: [birth year], 45 y.o.   MRN: [id number]  HPI  Here with mention of 3 different rash areas today; #1 is several red non itchy nontender slightly raised < 10 mm bumps to left lateral neck x 2 mo, ? Several mild enlarging, just wondering if anything to consider further; also with area to low lumbar midline mulitple area typical appearing ringworm type rash  With slight itchiness, no pain, onset x 1 mo after working in warm hot conditions at work with sweatting; also has bilat inguinal crease area of slightly tender erythem rash without maceration or weepiness or blisters, slightly itchy, no prior hx Past Medical History:  Diagnosis Date  . Allergic rhinitis   . Anal fissure   . Anxiety   . Asthma   . COPD (chronic obstructive pulmonary disease) ([place])   . Diverticulosis   . GERD (gastroesophageal reflux disease)   . H. pylori infection   . Hearing loss    chronic right  . Hiatal hernia   . Hyperlipidemia   . LBP (low back pain)    Past Surgical History:  Procedure Laterality Date  . STAPEDECTOMY    . VASECTOMY      reports that he has never smoked. He has never used smokeless tobacco. He reports that he does not drink alcohol or use drugs. family history includes Allergies in his other; Asthma in his son; COPD in his maternal grandfather; Colon polyps in his maternal grandfather; Diabetes in his maternal grandmother; Heart attack in his maternal grandmother; Heart failure in his other; Hyperlipidemia in his father and mother; Irritable bowel syndrome in his maternal aunt; Other in his mother; Ovarian cancer in his paternal grandmother; Prostate cancer in his maternal grandfather; Stroke in his maternal aunt. Allergies  Allergen Reactions  . Ciprofloxacin Other (See Comments)    Stools have bloody mucous  . Tiotropium Bromide Monohydrate Other (See Comments)    heart racing and paresthesias  . Aspirin   . Nabumetone     weakness  .  Penicillins   . Sulfonamide Derivatives    Current Outpatient Prescriptions on File Prior to Visit  Medication Sig Dispense Refill  . fluticasone (FLONASE) 50 MCG/ACT nasal spray USE TWO SPRAY(S) IN EACH NOSTRIL ONCE DAILY 16 g 2  . lansoprazole (PREVACID) 30 MG capsule TAKE ONE CAPSULE BY MOUTH TWICE DAILY 60 capsule 11  . levocetirizine (XYZAL) 5 MG tablet TAKE ONE TABLET BY MOUTH ONCE DAILY FOR  ALLERGY 30 tablet 0  . meloxicam (MOBIC) 15 MG tablet Take 1 tablet (15 mg total) by mouth daily. As needed 90 tablet 2  . montelukast (SINGULAIR) 10 MG tablet TAKE ONE TABLET BY MOUTH ONCE DAILY 30 tablet 0   No current facility-administered medications on file prior to visit.    Review of Systems  Constitutional: Negative for unusual diaphoresis or night sweats HENT: Negative for ear swelling or discharge Eyes: Negative for worsening visual haziness  Respiratory: Negative for choking and stridor.   Gastrointestinal: Negative for distension or worsening eructation Genitourinary: Negative for retention or change in urine volume.  Musculoskeletal: Negative for other MSK pain or swelling Skin: Negative for color change and worsening wound other than above Neurological: Negative for tremors and numbness other than noted  Psychiatric/Behavioral: Negative for decreased concentration or agitation other than above       Objective:   Physical Exam BP 128/72   Pulse 70   Temp  98 F (36.7 C) (Oral)   Resp 20   Wt 198 lb (89.8 kg)   SpO2 98%   BMI 31.96 kg/m  VS noted,  Constitutional: Pt appears in no apparent distress HENT: Head: NCAT.  Right Ear: External ear normal.  Left Ear: External ear normal.  Eyes: . Pupils are equal, round, and reactive to light. Conjunctivae and EOM are normal Neck: Normal range of motion. Neck supple.  Cardiovascular: Normal rate and regular rhythm.   Pulmonary/Chest: Effort normal and breath sounds without rales or wheezing.  Neurological: Pt is alert. Not  confused , motor grossly intact Skin: Skin is warm. no LE edema, nontender slightly raised < 10 mm bumps to left lateral neck approx 10 total,  low lumbar midline mulitple area typical appearing ringworm type rash, and bilat inguinal crease area of slightly tender erythem rash without maceration or weepiness or blisters, Psychiatric: Pt behavior is normal. No agitation.         Assessment & Plan:

## 2015-11-19 NOTE — Progress Notes (Signed)
Pre visit review using our clinic review tool, if applicable. No additional management support is needed unless otherwise documented below in the visit note. 

## 2015-11-19 NOTE — Patient Instructions (Addendum)
You had the flu shot today  Please take all new medication as prescribed - the cream and powder for rash  Please call for dermatology referral if not improved in 1-2 wks  Please continue all other medications as before, and refills have been done if requested - the albuterol inhaler  Please have the pharmacy call with any other refills you may need.  Please keep your appointments with your specialists as you may have planned

## 2015-11-22 NOTE — Assessment & Plan Note (Signed)
Etiology unclear, suspect possible contact dermatitis vs allergic, for triam cr prn,  to f/u any worsening symptoms or concerns

## 2015-11-22 NOTE — Assessment & Plan Note (Signed)
Typical for ringworm like rash - for ketoconozole cr prn,  to f/u any worsening symptoms or concerns

## 2015-11-22 NOTE — Assessment & Plan Note (Signed)
C/w likely candida rash, for nystatin powder asd,  to f/u any worsening symptoms or concerns

## 2015-12-17 ENCOUNTER — Ambulatory Visit (INDEPENDENT_AMBULATORY_CARE_PROVIDER_SITE_OTHER): Payer: BLUE CROSS/BLUE SHIELD | Admitting: Internal Medicine

## 2015-12-17 ENCOUNTER — Encounter: Payer: Self-pay | Admitting: Internal Medicine

## 2015-12-17 VITALS — BP 138/76 | HR 69 | Temp 98.8°F | Resp 20 | Wt 198.0 lb

## 2015-12-17 DIAGNOSIS — J309 Allergic rhinitis, unspecified: Secondary | ICD-10-CM | POA: Diagnosis not present

## 2015-12-17 DIAGNOSIS — J019 Acute sinusitis, unspecified: Secondary | ICD-10-CM

## 2015-12-17 MED ORDER — CLARITHROMYCIN 500 MG PO TABS: 500.0000 mg | ORAL_TABLET | Freq: Two times a day (BID) | ORAL | 0 refills | Status: DC

## 2015-12-17 NOTE — Patient Instructions (Signed)
Please take all new medication as prescribed - the antibiotic  You can also take Delsym OTC for cough, and/or Mucinex (or it's generic off brand) for congestion, and tylenol as needed for pain.  Please continue all other medications as before, and refills have been done if requested.  Please have the pharmacy call with any other refills you may need.  Please keep your appointments with your specialists as you may have planned    

## 2015-12-17 NOTE — Progress Notes (Signed)
Pre visit review using our clinic review tool, if applicable. No additional management support is needed unless otherwise documented below in the visit note. 

## 2015-12-17 NOTE — Progress Notes (Signed)
Subjective:    Patient ID: Billy Morris, male    DOB: 09-27-1970, 45 y.o.   MRN: [id number]  HPI   Here with 2-3 days acute onset fever, facial pain, pressure, left ear pain, headache, ST, general weakness and malaise, and greenish d/c, with non prod cough, but pt denies chest pain, wheezing, increased sob or doe, orthopnea, PND, increased LE swelling, palpitations, dizziness or syncope.  Not improved with xyzal and flonase.   Past Medical History:  Diagnosis Date  . Allergic rhinitis   . Anal fissure   . Anxiety   . Asthma   . COPD (chronic obstructive pulmonary disease) ([place])   . Diverticulosis   . GERD (gastroesophageal reflux disease)   . H. pylori infection   . Hearing loss    chronic right  . Hiatal hernia   . Hyperlipidemia   . LBP (low back pain)    Past Surgical History:  Procedure Laterality Date  . STAPEDECTOMY    . VASECTOMY      reports that he has never smoked. He has never used smokeless tobacco. He reports that he does not drink alcohol or use drugs. family history includes Allergies in his other; Asthma in his son; COPD in his maternal grandfather; Colon polyps in his maternal grandfather; Diabetes in his maternal grandmother; Heart attack in his maternal grandmother; Heart failure in his other; Hyperlipidemia in his father and mother; Irritable bowel syndrome in his maternal aunt; Other in his mother; Ovarian cancer in his paternal grandmother; Prostate cancer in his maternal grandfather; Stroke in his maternal aunt. Allergies  Allergen Reactions  . Ciprofloxacin Other (See Comments)    Stools have bloody mucous  . Tiotropium Bromide Monohydrate Other (See Comments)    heart racing and paresthesias  . Aspirin   . Nabumetone     weakness  . Penicillins   . Sulfonamide Derivatives    Current Outpatient Prescriptions on File Prior to Visit  Medication Sig Dispense Refill  . albuterol (PROAIR HFA) 108 (90 Base) MCG/ACT inhaler INHALE TWO PUFFS 4 TIMES DAILY AS  NEEDED FOR SHORTNESS OF BREATH 1 each 6  . fluticasone (FLONASE) 50 MCG/ACT nasal spray USE TWO SPRAY(S) IN EACH NOSTRIL ONCE DAILY 16 g 2  . ketoconazole (NIZORAL) 2 % cream Apply 1 application topically daily. 30 g 1  . lansoprazole (PREVACID) 30 MG capsule TAKE ONE CAPSULE BY MOUTH TWICE DAILY 60 capsule 11  . meloxicam (MOBIC) 15 MG tablet Take 1 tablet (15 mg total) by mouth daily. As needed 90 tablet 2  . nystatin (MYCOSTATIN/NYSTOP) powder Use as directed twice per day to affected area as needed 30 g 2   No current facility-administered medications on file prior to visit.    Review of Systems .All otherwise neg per pt     Objective:   Physical Exam BP 138/76   Pulse 69   Temp 98.8 F (37.1 C) (Oral)   Resp 20   Wt 198 lb (89.8 kg)   SpO2 98%   BMI 31.96 kg/m  VS noted, not ill appearing Constitutional: Pt appears in no apparent distress HENT: Head: NCAT.  Right Ear: External ear normal.  Left Ear: External ear normal.  Eyes: . Pupils are equal, round, and reactive to light. Conjunctivae and EOM are normal Bilat tm's with mild erythema.  Max sinus areas non tender.  Pharynx with mild erythema, no exudate Neck: Normal range of motion. Neck supple.  Cardiovascular: Normal rate and regular rhythm.  Pulmonary/Chest: Effort normal and breath sounds without rales or wheezing.  Neurological: Pt is alert. Not confused , motor grossly intact Skin: Skin is warm. No rash, no LE edema Psychiatric: Pt behavior is normal. No agitation.     Assessment & Plan:

## 2015-12-20 ENCOUNTER — Other Ambulatory Visit: Payer: Self-pay | Admitting: Internal Medicine

## 2015-12-22 NOTE — Assessment & Plan Note (Signed)
Mild to mod, for antibx course,  to f/u any worsening symptoms or concerns 

## 2015-12-22 NOTE — Assessment & Plan Note (Signed)
To cont xyzal and flonase asd,  to f/u any worsening symptoms or concerns

## 2015-12-23 ENCOUNTER — Encounter: Payer: Self-pay | Admitting: Internal Medicine

## 2015-12-24 ENCOUNTER — Ambulatory Visit: Payer: BLUE CROSS/BLUE SHIELD | Admitting: Internal Medicine

## 2015-12-24 ENCOUNTER — Other Ambulatory Visit: Payer: Self-pay | Admitting: Internal Medicine

## 2016-01-17 ENCOUNTER — Encounter: Payer: Self-pay | Admitting: Internal Medicine

## 2016-01-17 ENCOUNTER — Ambulatory Visit (INDEPENDENT_AMBULATORY_CARE_PROVIDER_SITE_OTHER): Payer: BLUE CROSS/BLUE SHIELD | Admitting: Internal Medicine

## 2016-01-17 DIAGNOSIS — R220 Localized swelling, mass and lump, head: Secondary | ICD-10-CM | POA: Diagnosis not present

## 2016-01-17 NOTE — Patient Instructions (Signed)
Please continue all other medications as before, and refills have been done if requested.  Please have the pharmacy call with any other refills you may need.  Please continue your efforts at being more active, low cholesterol diet, and weight control.  Please keep your appointments with your specialists as you may have planned  You will be contacted regarding the referral for: ENT [facility] ENT)

## 2016-01-17 NOTE — Progress Notes (Signed)
Pre visit review using our clinic review tool, if applicable. No additional management support is needed unless otherwise documented below in the visit note. 

## 2016-01-17 NOTE — Progress Notes (Signed)
Subjective:    Patient ID: Billy Morris, male    DOB: February 06, 1971, 45 y.o.   MRN: [id number]  HPI  Here to f/u, c/o persistent left lower facial jaw/swelling since last visit, just not improved, and denies pain, redness, skin change. Pt denies chest pain, increased sob or doe, wheezing, orthopnea, PND, increased LE swelling, palpitations, dizziness or syncope.  Pt denies new neurological symptoms such as new headache, or facial or extremity weakness or numbness   Pt denies polydipsia, polyuria A rash to post neck has resolved with tx last visit. Past Medical History:  Diagnosis Date  . Allergic rhinitis   . Anal fissure   . Anxiety   . Asthma   . COPD (chronic obstructive pulmonary disease) ([place])   . Diverticulosis   . GERD (gastroesophageal reflux disease)   . H. pylori infection   . Hearing loss    chronic right  . Hiatal hernia   . Hyperlipidemia   . LBP (low back pain)    Past Surgical History:  Procedure Laterality Date  . STAPEDECTOMY    . VASECTOMY      reports that he has never smoked. He has never used smokeless tobacco. He reports that he does not drink alcohol or use drugs. family history includes Allergies in his other; Asthma in his son; COPD in his maternal grandfather; Colon polyps in his maternal grandfather; Diabetes in his maternal grandmother; Heart attack in his maternal grandmother; Heart failure in his other; Hyperlipidemia in his father and mother; Irritable bowel syndrome in his maternal aunt; Other in his mother; Ovarian cancer in his paternal grandmother; Prostate cancer in his maternal grandfather; Stroke in his maternal aunt. Allergies  Allergen Reactions  . Ciprofloxacin Other (See Comments)    Stools have bloody mucous  . Tiotropium Bromide Monohydrate Other (See Comments)    heart racing and paresthesias  . Aspirin   . Nabumetone     weakness  . Penicillins   . Sulfonamide Derivatives    Current Outpatient Prescriptions on File Prior to Visit    Medication Sig Dispense Refill  . albuterol (PROAIR HFA) 108 (90 Base) MCG/ACT inhaler INHALE TWO PUFFS 4 TIMES DAILY AS NEEDED FOR SHORTNESS OF BREATH 1 each 6  . clarithromycin (BIAXIN) 500 MG tablet Take 1 tablet (500 mg total) by mouth 2 (two) times daily. 20 tablet 0  . fluticasone (FLONASE) 50 MCG/ACT nasal spray USE TWO SPRAY(S) IN EACH NOSTRIL ONCE DAILY 16 g 2  . ketoconazole (NIZORAL) 2 % cream Apply 1 application topically daily. 30 g 1  . lansoprazole (PREVACID) 30 MG capsule TAKE ONE CAPSULE BY MOUTH TWICE DAILY 60 capsule 11  . levocetirizine (XYZAL) 5 MG tablet TAKE ONE TABLET BY MOUTH ONCE DAILY FOR ALLERGY 30 tablet 0  . meloxicam (MOBIC) 15 MG tablet Take 1 tablet (15 mg total) by mouth daily. As needed 90 tablet 2  . montelukast (SINGULAIR) 10 MG tablet TAKE ONE TABLET BY MOUTH ONCE DAILY 30 tablet 0  . nystatin (MYCOSTATIN/NYSTOP) powder Use as directed twice per day to affected area as needed 30 g 2   No current facility-administered medications on file prior to visit.    Review of Systems All otherwise neg per pt     Objective:   Physical Exam BP 130/70   Pulse 60   Temp 98 F (36.7 C) (Oral)   Resp 20   Wt 199 lb (90.3 kg)   SpO2 98%   BMI 32.12 kg/m  VS noted,  Constitutional: Pt appears in no apparent distress HENT: Head: NCAT.  Right Ear: External ear normal.  Left Ear: External ear normal.  Eyes: . Pupils are equal, round, and reactive to light. Conjunctivae and EOM are normal Mouth: no mass, erythema Left facial/jaw area with nondiscrete but vague nontender swollen area approx 2 x 3 cm area only slight but definitely raised Neck: Normal range of motion. Neck supple. No specific mass or LA Cardiovascular: Normal rate and regular rhythm.   Pulmonary/Chest: Effort normal and breath sounds without rales or wheezing.  Abd:  Soft, NT, ND, + BS, no HSM Neurological: Pt is alert. Not confused , motor grossly intact, cn 2-12 intact Skin: Skin is warm. No  rash, no LE edema Psychiatric: Pt behavior is normal. No agitation.  No other new exam findings    Assessment & Plan:

## 2016-01-18 NOTE — Assessment & Plan Note (Signed)
C/w swelling in left parotid area, nontender but firm, not better with recent antibx tx, not worse but not better, o/w asympt, for ENT referral - ? Parotid tumor

## 2016-01-24 ENCOUNTER — Other Ambulatory Visit: Payer: Self-pay | Admitting: Internal Medicine

## 2016-01-24 DIAGNOSIS — H9202 Otalgia, left ear: Secondary | ICD-10-CM | POA: Diagnosis not present

## 2016-01-24 DIAGNOSIS — M26622 Arthralgia of left temporomandibular joint: Secondary | ICD-10-CM | POA: Diagnosis not present

## 2016-01-24 DIAGNOSIS — H8093 Unspecified otosclerosis, bilateral: Secondary | ICD-10-CM | POA: Diagnosis not present

## 2016-01-24 DIAGNOSIS — H8090 Unspecified otosclerosis, unspecified ear: Secondary | ICD-10-CM | POA: Insufficient documentation

## 2016-02-28 ENCOUNTER — Other Ambulatory Visit: Payer: Self-pay | Admitting: Internal Medicine

## 2016-03-02 DIAGNOSIS — N50812 Left testicular pain: Secondary | ICD-10-CM | POA: Diagnosis not present

## 2016-03-02 DIAGNOSIS — N5082 Scrotal pain: Secondary | ICD-10-CM | POA: Diagnosis not present

## 2016-03-13 ENCOUNTER — Other Ambulatory Visit: Payer: Self-pay | Admitting: Internal Medicine

## 2016-03-20 ENCOUNTER — Encounter: Payer: Self-pay | Admitting: Internal Medicine

## 2016-03-20 ENCOUNTER — Ambulatory Visit (INDEPENDENT_AMBULATORY_CARE_PROVIDER_SITE_OTHER): Payer: BLUE CROSS/BLUE SHIELD | Admitting: Internal Medicine

## 2016-03-20 ENCOUNTER — Telehealth: Payer: Self-pay | Admitting: *Deleted

## 2016-03-20 VITALS — BP 120/78 | HR 82 | Ht 66.0 in | Wt 205.0 lb

## 2016-03-20 DIAGNOSIS — R1012 Left upper quadrant pain: Secondary | ICD-10-CM | POA: Diagnosis not present

## 2016-03-20 MED ORDER — OSELTAMIVIR PHOSPHATE 75 MG PO CAPS: 75.0000 mg | ORAL_CAPSULE | Freq: Every day | ORAL | 0 refills | Status: DC

## 2016-03-20 NOTE — Patient Instructions (Signed)
You have been scheduled for a CT scan of the abdomen and pelvis at [facility] ([address]---this is in the same building as [facility] [employer]).   You are scheduled on 03/27/2016 at 2:00pm. You should arrive 15 minutes prior to your appointment time for registration. Please follow the written instructions below on the day of your exam:  WARNING: IF YOU ARE ALLERGIC TO IODINE/X-RAY DYE, PLEASE NOTIFY RADIOLOGY IMMEDIATELY AT [phone]! YOU WILL BE GIVEN A 13 HOUR PREMEDICATION PREP.  1) Do not eat or drink anything after 10:00am (4 hours prior to your test) 2) You have been given 2 bottles of oral contrast to drink. The solution may taste   better if refrigerated, but do NOT add ice or any other liquid to this solution. Shake well before drinking.    Drink 1 bottle of contrast @ 12:00pm (2 hours prior to your exam)  Drink 1 bottle of contrast @ 1:00pm (1 hour prior to your exam)  You may take any medications as prescribed with a small amount of water except for the following: Metformin, Glucophage, Glucovance, Avandamet, Riomet, Fortamet, Actoplus Met, Janumet, Glumetza or Metaglip. The above medications must be held the day of the exam AND 48 hours after the exam.  The purpose of you drinking the oral contrast is to aid in the visualization of your intestinal tract. The contrast solution may cause some diarrhea. Before your exam is started, you will be given a small amount of fluid to drink. Depending on your individual set of symptoms, you may also receive an intravenous injection of x-ray contrast/dye. Plan on being at [facility] for 30 minutes or long, depending on the type of exam you are having performed.  If you have any questions regarding your exam or if you need to reschedule, you may call the CT department at [phone] between the hours of 8:00 am and 5:00 pm, Monday-Friday.  ________________________________________________________________________

## 2016-03-20 NOTE — Progress Notes (Signed)
HISTORY OF PRESENT ILLNESS:  Billy Morris is a 46 y.o. male who has been seen previously in this office for complaints of abdominal pain and anal fissure. Last evaluation September 2015. He presents today with a chief complaint of left upper quadrant/left mid abdomen pain which began several months ago. He describes the pain as sharp. May last several seconds or for hours. Occurs several times per week. This may or may not be affected by food. No effect by activity. Does admit to significant stress at home and work. Has gained 3 pounds. No nausea or vomiting. Some transient constipation. Experience symptoms occasionally at night area does not use alcohol. Notices that the pain radiates into his back. Does have back issues. NSAID seemed to help. He is worried. The patient underwent colonoscopy and upper endoscopy in 2011 to evaluate abdominal pain and questionable abnormal colonic wall thickening on CT. Upper endoscopy was normal. He was diagnosed with nonulcer dyspepsia. Colonoscopy revealed a few scattered diverticula. Otherwise normal. Normal terminal ileum. Repeat examination 50 recommended. No recurrent problems with fissure. No recent evaluation or imaging. EMR reviewed. Does take Prevacid for reflux. Using meloxicam more recently  REVIEW OF SYSTEMS:  All non-GI ROS negative except for sinus and allergy, anxiety, back pain, visual change, sore throat, swollen lymph glands  Past Medical History:  Diagnosis Date  . Allergic rhinitis   . Anal fissure   . Anxiety   . Asthma   . COPD (chronic obstructive pulmonary disease) ([place])   . Diverticulosis   . GERD (gastroesophageal reflux disease)   . H. pylori infection   . Hearing loss    chronic right  . Hiatal hernia   . Hyperlipidemia   . LBP (low back pain)   . TMJ (dislocation of temporomandibular joint)     Past Surgical History:  Procedure Laterality Date  . STAPEDECTOMY    . VASECTOMY      Social History Billy Morris  reports that he  has never smoked. He has never used smokeless tobacco. He reports that he does not drink alcohol or use drugs.  family history includes Allergies in his other; Asthma in his son; COPD in his maternal grandfather; Colon polyps in his maternal grandfather; Diabetes in his maternal grandmother; Heart attack in his maternal grandmother; Heart failure in his other; Hyperlipidemia in his father and mother; Irritable bowel syndrome in his maternal aunt; Other in his mother; Ovarian cancer in his paternal grandmother; Prostate cancer in his maternal grandfather; Stroke in his maternal aunt.  Allergies  Allergen Reactions  . Ciprofloxacin Other (See Comments)    Stools have bloody mucous  . Tiotropium Bromide Monohydrate Other (See Comments)    heart racing and paresthesias  . Aspirin   . Nabumetone     weakness  . Penicillins   . Sulfonamide Derivatives        PHYSICAL EXAMINATION: Vital signs: BP 120/78   Pulse 82   Ht 5\' 6"  (1.676 m)   Wt 205 lb (93 kg)   BMI 33.09 kg/m   Constitutional: generally well-appearing, no acute distress Psychiatric:Slightly anxious, pleasant, alert and oriented x3, cooperative Eyes: extraocular movements intact, anicteric, conjunctiva pink Mouth: oral pharynx moist, no lesions Neck: supple without thyromegaly Lymph: no obvious lymphadenopathy Cardiovascular: heart regular rate and rhythm, no murmur Lungs: clear to auscultation bilaterally Abdomen: soft, nontender, nondistended, no obvious ascites, no peritoneal signs, normal bowel sounds, no organomegaly Rectal: Omitted Extremities: no clubbing cyanosis or lower extremity edema bilaterally Skin: no  lesions on visible extremities Neuro: No focal deficits. Cranial nerves intact  ASSESSMENT:  #1. Recurrent problems with left upper quadrant pain with radiation into the back. Persistent despite PPI. Questionable improvement with NSAIDs. Question musculoskeletal versus gastrointestinal. Previous endoscopic  evaluations as noted. Given the location of the pain he would be best served with contrast-enhanced CT scan of the abdomen and pelvis to rule out problems such as solid tumor space-occupying lesion.  PLAN:  #1. Contrast-enhanced CT scan of the abdomen and pelvis. We will contact him with results and recommendations #2. If negative, continue NSAIDs if they help and see PCP for possible musculoskeletal/back pain with referred pain #3. Reflux precautions with attention to weight loss #4. Continue PPI for GERD #5. Screening colonoscopy at age 32  #37. Ongoing general medical care with PCP

## 2016-03-20 NOTE — Telephone Encounter (Signed)
Notified pt w/MD response.../lmb 

## 2016-03-20 NOTE — Telephone Encounter (Signed)
Pt left msg on triage stating he has asthma, and his kids has been dx w/the flu. Two people at his job has also been dx w/the flu. He is wanting to know does he need to take Tamiflu even though he is not experiencing any symptoms yet if so req rx to be sent to [facility]...Johny Chess

## 2016-03-20 NOTE — Telephone Encounter (Signed)
Yes, rx sent to [facility]

## 2016-03-27 ENCOUNTER — Ambulatory Visit (INDEPENDENT_AMBULATORY_CARE_PROVIDER_SITE_OTHER)
Admission: RE | Admit: 2016-03-27 | Discharge: 2016-03-27 | Disposition: A | Discharge status: Home / Self Care | Payer: BLUE CROSS/BLUE SHIELD | Source: Ambulatory Visit | Attending: Internal Medicine | Admitting: Internal Medicine

## 2016-03-27 DIAGNOSIS — R1012 Left upper quadrant pain: Secondary | ICD-10-CM | POA: Diagnosis not present

## 2016-03-27 DIAGNOSIS — R109 Unspecified abdominal pain: Secondary | ICD-10-CM | POA: Diagnosis not present

## 2016-03-27 MED ORDER — IOPAMIDOL (ISOVUE-300) INJECTION 61%
100.0000 mL | Freq: Once | INTRAVENOUS | Status: AC | PRN
  Administered 2016-03-27: 100 mL via INTRAVENOUS

## 2016-04-02 ENCOUNTER — Other Ambulatory Visit: Payer: Self-pay | Admitting: Internal Medicine

## 2016-05-06 ENCOUNTER — Telehealth: Payer: Self-pay | Admitting: Internal Medicine

## 2016-05-06 MED ORDER — AZITHROMYCIN 250 MG PO TABS: ORAL_TABLET | ORAL | 1 refills | Status: DC

## 2016-05-06 MED ORDER — OSELTAMIVIR PHOSPHATE 75 MG PO CAPS: 75.0000 mg | ORAL_CAPSULE | Freq: Every day | ORAL | 0 refills | Status: DC

## 2016-05-06 NOTE — Telephone Encounter (Signed)
Called pt, LVM.   

## 2016-05-06 NOTE — Telephone Encounter (Signed)
tamiflu and zpack done erx

## 2016-05-06 NOTE — Telephone Encounter (Signed)
Patient states that his child was diagnosed with flu and strep.  States his wife seen Dr. Jenny Reichmann yesterday and was given tamiflu and an antibiotic.  Patient states he has not had a fever but his temp has been running higher than normal the last two days and he has a sore throat.  Patient would like to know if Dr. Jenny Reichmann could send any medication in for him?

## 2016-05-07 ENCOUNTER — Ambulatory Visit: Payer: BLUE CROSS/BLUE SHIELD | Admitting: Internal Medicine

## 2016-05-29 ENCOUNTER — Ambulatory Visit (INDEPENDENT_AMBULATORY_CARE_PROVIDER_SITE_OTHER): Payer: BLUE CROSS/BLUE SHIELD

## 2016-05-29 ENCOUNTER — Encounter: Payer: Self-pay | Admitting: Podiatry

## 2016-05-29 ENCOUNTER — Ambulatory Visit (INDEPENDENT_AMBULATORY_CARE_PROVIDER_SITE_OTHER): Payer: BLUE CROSS/BLUE SHIELD | Admitting: Podiatry

## 2016-05-29 VITALS — BP 138/87 | HR 65 | Resp 16

## 2016-05-29 DIAGNOSIS — M722 Plantar fascial fibromatosis: Secondary | ICD-10-CM | POA: Diagnosis not present

## 2016-05-29 DIAGNOSIS — M25571 Pain in right ankle and joints of right foot: Secondary | ICD-10-CM | POA: Diagnosis not present

## 2016-05-29 NOTE — Progress Notes (Signed)
   Subjective:    Patient ID: Billy Morris, male    DOB: 1970/02/16, 46 y.o.   MRN: [id number]  HPI 46 year old male presents the office today for concerns of right plantar heel pain which has been aching since 2015. He has pain in the morning he first gets up in 7 to get some pain but also asking the ankle. He isn't wearing an ankle brace as well as taking Advil which is been helping some he does continue to get pain. He denies any recent injury or trauma. Denies any claudication symptoms. The pain does not wake him up at night. No numbness or tingling. He has no other complaints at this time.   Review of Systems  HENT: Positive for ear pain, hearing loss, sinus pressure and tinnitus.   Musculoskeletal: Positive for arthralgias.  Allergic/Immunologic: Positive for food allergies.  All other systems reviewed and are negative.      Objective:   Physical Exam General: AAO x3, NAD  Dermatological: Skin is warm, dry and supple bilateral. Nails x 10 are well manicured; remaining integument appears unremarkable at this time. There are no open sores, no preulcerative lesions, no rash or signs of infection present.  Vascular: Dorsalis Pedis artery and Posterior Tibial artery pedal pulses are 2/4 bilateral with immedate capillary fill time. There is no pain with calf compression, swelling, warmth, erythema.   Neruologic: Grossly intact via light touch bilateral. Vibratory intact via tuning fork bilateral. Protective threshold with Semmes Wienstein monofilament intact to all pedal sites bilateral. Negative tinel sign.   Musculoskeletal: Tenderness to palpation along the plantar medial tubercle of the calcaneus at the insertion of plantar fascia on the right foot. There is no pain along the course of the plantar fascia within the arch of the foot. Plantar fascia appears to be intact. There is no pain with lateral compression of the calcaneus or pain with vibratory sensation. There is no pain along the  course or insertion of the achilles tendon. No other areas of tenderness to bilateral lower extremities. Muscular strength 5/5 in all groups tested bilateral.  Gait: Unassisted, Nonantalgic.      Assessment & Plan:  46 year old male chronic heel pain right side likely myofascial -Treatment options discussed including all alternatives, risks, and complications -Etiology of symptoms were discussed -X-rays were obtained and reviewed with the patient. No evidence of acute fracture identified. -Declined steroid due to steroid allergy. -Plantar fascial brace dispensed -Monitor mildly he likely benefit from orthotics. He is tried over-the-counter we will continue with custom at this time. He wishes to go ahead and proceed with these. He was measured for inserts in [facility]. -Stretching and icing exercises daily. Discussed shoe gear modifications. -RTC 3 weeks or sooner if needed.  Celesta Gentile, DPM

## 2016-05-29 NOTE — Patient Instructions (Signed)

## 2016-06-12 ENCOUNTER — Encounter: Payer: BLUE CROSS/BLUE SHIELD | Admitting: Podiatry

## 2016-06-12 NOTE — Progress Notes (Signed)
Patient presents to [facility] and they were not in. Will contact pt once they arrive.  Patient states he is doing much better an no pain. Therefore, he cancelled today's appointment.

## 2016-06-15 ENCOUNTER — Telehealth: Payer: Self-pay | Admitting: *Deleted

## 2016-06-15 NOTE — Telephone Encounter (Signed)
Left message for patient to call and schedule an appointment to pick up orthotics.

## 2016-06-19 DIAGNOSIS — H90A32 Mixed conductive and sensorineural hearing loss, unilateral, left ear with restricted hearing on the contralateral side: Secondary | ICD-10-CM | POA: Diagnosis not present

## 2016-06-19 DIAGNOSIS — H90A21 Sensorineural hearing loss, unilateral, right ear, with restricted hearing on the contralateral side: Secondary | ICD-10-CM | POA: Diagnosis not present

## 2016-06-19 DIAGNOSIS — H9072 Mixed conductive and sensorineural hearing loss, unilateral, left ear, with unrestricted hearing on the contralateral side: Secondary | ICD-10-CM | POA: Insufficient documentation

## 2016-06-19 DIAGNOSIS — H905 Unspecified sensorineural hearing loss: Secondary | ICD-10-CM | POA: Insufficient documentation

## 2016-06-19 DIAGNOSIS — H8093 Unspecified otosclerosis, bilateral: Secondary | ICD-10-CM | POA: Diagnosis not present

## 2016-07-09 ENCOUNTER — Other Ambulatory Visit: Payer: BLUE CROSS/BLUE SHIELD

## 2016-07-13 ENCOUNTER — Other Ambulatory Visit: Payer: Self-pay | Admitting: Internal Medicine

## 2016-08-10 ENCOUNTER — Encounter: Payer: Self-pay | Admitting: Internal Medicine

## 2016-08-17 ENCOUNTER — Other Ambulatory Visit: Payer: Self-pay | Admitting: Internal Medicine

## 2016-09-07 ENCOUNTER — Other Ambulatory Visit: Payer: Self-pay | Admitting: Internal Medicine

## 2016-09-17 DIAGNOSIS — K219 Gastro-esophageal reflux disease without esophagitis: Secondary | ICD-10-CM | POA: Insufficient documentation

## 2016-09-17 DIAGNOSIS — R490 Dysphonia: Secondary | ICD-10-CM | POA: Diagnosis not present

## 2016-09-17 DIAGNOSIS — H9202 Otalgia, left ear: Secondary | ICD-10-CM | POA: Diagnosis not present

## 2016-09-17 DIAGNOSIS — M26622 Arthralgia of left temporomandibular joint: Secondary | ICD-10-CM | POA: Diagnosis not present

## 2016-09-21 ENCOUNTER — Other Ambulatory Visit: Payer: Self-pay | Admitting: Internal Medicine

## 2016-09-24 ENCOUNTER — Other Ambulatory Visit: Payer: Self-pay | Admitting: Internal Medicine

## 2016-10-20 ENCOUNTER — Other Ambulatory Visit: Payer: Self-pay | Admitting: Internal Medicine

## 2016-11-18 ENCOUNTER — Other Ambulatory Visit: Payer: Self-pay | Admitting: Internal Medicine

## 2016-12-03 ENCOUNTER — Other Ambulatory Visit: Payer: Self-pay | Admitting: Internal Medicine

## 2016-12-08 ENCOUNTER — Ambulatory Visit (INDEPENDENT_AMBULATORY_CARE_PROVIDER_SITE_OTHER): Payer: BLUE CROSS/BLUE SHIELD | Admitting: Internal Medicine

## 2016-12-08 ENCOUNTER — Encounter: Payer: Self-pay | Admitting: Internal Medicine

## 2016-12-08 VITALS — BP 124/84 | HR 69 | Temp 98.4°F | Resp 16 | Wt 205.2 lb

## 2016-12-08 DIAGNOSIS — R220 Localized swelling, mass and lump, head: Secondary | ICD-10-CM | POA: Diagnosis not present

## 2016-12-08 DIAGNOSIS — J45909 Unspecified asthma, uncomplicated: Secondary | ICD-10-CM

## 2016-12-08 DIAGNOSIS — Z114 Encounter for screening for human immunodeficiency virus [HIV]: Secondary | ICD-10-CM

## 2016-12-08 DIAGNOSIS — Z0000 Encounter for general adult medical examination without abnormal findings: Secondary | ICD-10-CM

## 2016-12-08 DIAGNOSIS — J309 Allergic rhinitis, unspecified: Secondary | ICD-10-CM | POA: Diagnosis not present

## 2016-12-08 DIAGNOSIS — Z23 Encounter for immunization: Secondary | ICD-10-CM | POA: Diagnosis not present

## 2016-12-08 DIAGNOSIS — R739 Hyperglycemia, unspecified: Secondary | ICD-10-CM

## 2016-12-08 MED ORDER — MONTELUKAST SODIUM 10 MG PO TABS: 10.0000 mg | ORAL_TABLET | Freq: Every day | ORAL | 3 refills | Status: DC

## 2016-12-08 NOTE — Progress Notes (Signed)
Subjective:    Patient ID: Billy Morris, male    DOB: 1970/10/28, 46 y.o.   MRN: [id number]  HPI  Here for wellness and f/u;  Overall doing ok;  Pt denies Chest pain, worsening SOB, DOE, wheezing, orthopnea, PND, worsening LE edema, palpitations, dizziness or syncope.  Pt denies neurological change such as new headache, facial or extremity weakness.  Pt denies polydipsia, polyuria, or low sugar symptoms. Pt states overall good compliance with treatment and medications, good tolerability, and has been trying to follow appropriate diet.  Pt denies worsening depressive symptoms, suicidal ideation or panic. No fever, night sweats, wt loss, loss of appetite, or other constitutional symptoms.  Pt states good ability with ADL's, has low fall risk, home safety reviewed and adequate, no other significant changes in hearing or vision, and only occasionally active with exercise, has cont'd to gain wt with c alorie excess. Wt Readings from Last 3 Encounters:  12/08/16 205 lb 4 oz (93.1 kg)  03/20/16 205 lb (93 kg)  01/17/16 199 lb (90.3 kg)  LUQ pain improved, ct neg 03/2106 Also,  Saw ENT after last exam here dec 2017, exam c/w nonspecific findings only with probable benign neck lymphadenopathy, and advised to increase the PPI to bid in tx for ST. Was advised to return if not improved for laryngeal visualization in 2 mo, but has not been back.  Still now with ST with nasal congestion and post nasal gtt and occas non prod coughk but not feeling sick, no fever.  Does have several months ongoing nasal allergy symptoms with clearish congestion, ST, itch and sneezing, without fever, pain, cough, swelling or wheezing.  Also concerned about white spot to left soft palate that seems painful and just wont go away.  Asks for ENT second opinion Past Medical History:  Diagnosis Date  . Allergic rhinitis   . Anal fissure   . Anxiety   . Asthma   . COPD (chronic obstructive pulmonary disease) ([place])   . Diverticulosis   .  GERD (gastroesophageal reflux disease)   . H. pylori infection   . Hearing loss    chronic right  . Hiatal hernia   . Hyperlipidemia   . LBP (low back pain)   . TMJ (dislocation of temporomandibular joint)    Past Surgical History:  Procedure Laterality Date  . STAPEDECTOMY    . VASECTOMY      reports that he has never smoked. He has never used smokeless tobacco. He reports that he does not drink alcohol or use drugs. family history includes Allergies in his other; Asthma in his son; COPD in his maternal grandfather; Colon polyps in his maternal grandfather; Diabetes in his maternal grandmother; Heart attack in his maternal grandmother; Heart failure in his other; Hyperlipidemia in his father and mother; Irritable bowel syndrome in his maternal aunt; Other in his mother; Ovarian cancer in his paternal grandmother; Prostate cancer in his maternal grandfather; Stroke in his maternal aunt. Allergies  Allergen Reactions  . Beef Extract Hives    All red meat causes hives  . Ciprofloxacin Other (See Comments) and Diarrhea    Bloody stool Bloody stool Stools have bloody mucous  . Penicillins Rash  . Tiotropium Bromide Monohydrate Other (See Comments)    heart racing and paresthesias  . Aspirin     Other reaction(s): Other (See Comments)  . Nabumetone     Other reaction(s): Other (See Comments) weakness weakness  . Sulfonamide Derivatives    Current Outpatient Prescriptions  on File Prior to Visit  Medication Sig Dispense Refill  . albuterol (PROAIR HFA) 108 (90 Base) MCG/ACT inhaler INHALE TWO PUFFS 4 TIMES DAILY AS NEEDED FOR SHORTNESS OF BREATH 1 each 6  . fluticasone (FLONASE) 50 MCG/ACT nasal spray Place 2 sprays into both nostrils daily. **PT NEEDS PHYSICAL FOR ADDITIONAL REFILLS** 16 g 0  . ketoconazole (NIZORAL) 2 % cream Apply 1 application topically daily. 30 g 1  . lansoprazole (PREVACID) 30 MG capsule TAKE ONE CAPSULE BY MOUTH TWICE DAILY 60 capsule 11  . levocetirizine  (XYZAL) 5 MG tablet Take 1 tablet (5 mg total) by mouth every evening. Overdue for annual appt must see provider for future refills 30 tablet 0  . levocetirizine (XYZAL) 5 MG tablet TAKE 1 TABLET BY MOUTH ONCE DAILY FOR ALLERGY 90 tablet 0  . meloxicam (MOBIC) 15 MG tablet TAKE ONE TABLET BY MOUTH ONCE DAILY AS NEEDED 30 tablet 8  . nystatin (MYCOSTATIN/NYSTOP) powder Use as directed twice per day to affected area as needed 30 g 2   No current facility-administered medications on file prior to visit.    Review of Systems Constitutional: Negative for other unusual diaphoresis, sweats, appetite or weight changes HENT: Negative for other worsening hearing loss, ear pain, facial swelling, mouth sores or neck stiffness.   Eyes: Negative for other worsening pain, redness or other visual disturbance.  Respiratory: Negative for other stridor or swelling Cardiovascular: Negative for other palpitations or other chest pain  Gastrointestinal: Negative for worsening diarrhea or loose stools, blood in stool, distention or other pain Genitourinary: Negative for hematuria, flank pain or other change in urine volume.  Musculoskeletal: Negative for myalgias or other joint swelling.  Skin: Negative for other color change, or other wound or worsening drainage.  Neurological: Negative for other syncope or numbness. Hematological: Negative for other adenopathy or swelling Psychiatric/Behavioral: Negative for hallucinations, other worsening agitation, SI, self-injury, or new decreased concentration All other system neg per pt    Objective:   Physical Exam BP 124/84 (BP Location: Left Arm, Patient Position: Sitting, Cuff Size: Normal)   Pulse 69   Temp 98.4 F (36.9 C) (Oral)   Resp 16   Wt 205 lb 4 oz (93.1 kg)   SpO2 97%   BMI 33.13 kg/m  VS noted,  Constitutional: Pt is oriented to person, place, and time. Appears well-developed and well-nourished, in no significant distress and comfortable Head:  Normocephalic and atraumatic  Eyes: Conjunctivae and EOM are normal. Pupils are equal, round, and reactive to light Bilat tm's with mild erythema.  Max sinus areas non tender.  Pharynx with mild erythema, no exudate but with small whitish raised area to left soft palate Right Ear: External ear normal without discharge Left Ear: External ear normal without discharge Nose: Nose without discharge or deformity Mouth/Throat: Oropharynx is without other ulcerations and moist  Neck: Normal range of motion. Neck supple. No JVD present. No tracheal deviation present or significant neck LA or mass Cardiovascular: Normal rate, regular rhythm, normal heart sounds and intact distal pulses.   Pulmonary/Chest: WOB normal and breath sounds without rales or wheezing  Abdominal: Soft. Bowel sounds are normal. NT. No HSM  Musculoskeletal: Normal range of motion. Exhibits no edema Lymphadenopathy: Has no other cervical adenopathy.  Neurological: Pt is alert and oriented to person, place, and time. Pt has normal reflexes. No cranial nerve deficit. Motor grossly intact, Gait intact Skin: Skin is warm and dry. No rash noted or new ulcerations Psychiatric:  Has  normal mood and affect. Behavior is normal without agitation No other exam findings Lab Results  Component Value Date   WBC 6.1 02/22/2015   HGB 14.4 02/22/2015   HCT 43.3 02/22/2015   PLT 270.0 02/22/2015   GLUCOSE 92 02/22/2015   CHOL 186 02/22/2015   TRIG 42.0 02/22/2015   HDL 56.40 02/22/2015   LDLCALC 121 (H) 02/22/2015   ALT 28 02/22/2015   AST 20 02/22/2015   NA 141 02/22/2015   K 4.3 02/22/2015   CL 107 02/22/2015   CREATININE 0.82 02/22/2015   BUN 16 02/22/2015   CO2 27 02/22/2015   TSH 1.41 02/22/2015   PSA 0.33 02/22/2015       Assessment & Plan:

## 2016-12-08 NOTE — Assessment & Plan Note (Signed)

## 2016-12-08 NOTE — Assessment & Plan Note (Addendum)
I suspect benign, pt very concerned, ok for referral ENT second opinion  In addition to the time spent performing CPE, I spent an additional 15 minutes face to face,in which greater than 50% of this time was spent in counseling and coordination of care for patient's illness as documented, including the differential dx, tx, further evaluation and other management of soft palate mass, allergies and asthma/

## 2016-12-08 NOTE — Assessment & Plan Note (Signed)
stable overall by history and exam, and pt to continue medical treatment as before,  to f/u any worsening symptoms or concerns 

## 2016-12-08 NOTE — Patient Instructions (Addendum)
You had the flu shot today  Please continue all other medications as before, and refills have been done if requested.  Please have the pharmacy call with any other refills you may need.  Please continue your efforts at being more active, low cholesterol diet, and weight control.  You are otherwise up to date with prevention measures today.  Please keep your appointments with your specialists as you may have planned  You will be contacted regarding the referral for: ENT  Please go to the LAB in the Basement (turn left off the elevator) for the tests to be done at your convenience  You will be contacted by phone if any changes need to be made immediately.  Otherwise, you will receive a letter about your results with an explanation, but please check with MyChart first.  Please remember to sign up for MyChart if you have not done so, as this will be important to you in the future with finding out test results, communicating by private email, and scheduling acute appointments online when needed.  Please return in 1 year for your yearly visit, or sooner if needed, with Lab testing done 3-5 days before

## 2016-12-08 NOTE — Assessment & Plan Note (Signed)
To restart antihist and nasal steroid

## 2016-12-09 ENCOUNTER — Other Ambulatory Visit (INDEPENDENT_AMBULATORY_CARE_PROVIDER_SITE_OTHER): Payer: BLUE CROSS/BLUE SHIELD

## 2016-12-09 DIAGNOSIS — Z0000 Encounter for general adult medical examination without abnormal findings: Secondary | ICD-10-CM

## 2016-12-09 DIAGNOSIS — Z114 Encounter for screening for human immunodeficiency virus [HIV]: Secondary | ICD-10-CM | POA: Diagnosis not present

## 2016-12-09 DIAGNOSIS — R739 Hyperglycemia, unspecified: Secondary | ICD-10-CM

## 2016-12-09 LAB — URINALYSIS, ROUTINE W REFLEX MICROSCOPIC
BILIRUBIN URINE: NEGATIVE
HGB URINE DIPSTICK: NEGATIVE
KETONES UR: NEGATIVE
Leukocytes, UA: NEGATIVE
NITRITE: NEGATIVE
RBC / HPF: NONE SEEN (ref 0–?)
Specific Gravity, Urine: >=1.03 — AB (ref 1.000–1.030)
TOTAL PROTEIN, URINE-UPE24: NEGATIVE
URINE GLUCOSE: NEGATIVE
Urobilinogen, UA: 0.2 (ref 0.0–1.0)
pH: 6 (ref 5.0–8.0)

## 2016-12-09 LAB — BASIC METABOLIC PANEL
BUN: 16 mg/dL (ref 6–23)
CHLORIDE: 103 meq/L (ref 96–112)
CO2: 29 mEq/L (ref 19–32)
CREATININE: 0.79 mg/dL (ref 0.40–1.50)
Calcium: 9.5 mg/dL (ref 8.4–10.5)
GFR: 112.04 mL/min (ref >60.00)
GLUCOSE: 92 mg/dL (ref 70–99)
Potassium: 4.4 mEq/L (ref 3.5–5.1)
Sodium: 140 mEq/L (ref 135–145)

## 2016-12-09 LAB — CBC WITH DIFFERENTIAL/PLATELET
BASOS ABS: 0.1 10*3/uL (ref 0.0–0.1)
BASOS PCT: 0.9 % (ref 0.0–3.0)
Eosinophils Absolute: 0.2 10*3/uL (ref 0.0–0.7)
Eosinophils Relative: 3.1 % (ref 0.0–5.0)
HEMATOCRIT: 41.8 % (ref 39.0–52.0)
Hemoglobin: 14.3 g/dL (ref 13.0–17.0)
LYMPHS PCT: 28.5 % (ref 12.0–46.0)
Lymphs Abs: 1.7 10*3/uL (ref 0.7–4.0)
MCHC: 34.3 g/dL (ref 30.0–36.0)
MCV: 92.4 fl (ref 78.0–100.0)
MONO ABS: 0.6 10*3/uL (ref 0.1–1.0)
Monocytes Relative: 9.6 % (ref 3.0–12.0)
Neutro Abs: 3.5 10*3/uL (ref 1.4–7.7)
Neutrophils Relative %: 57.9 % (ref 43.0–77.0)
Platelets: 243 10*3/uL (ref 150.0–400.0)
RBC: 4.52 Mil/uL (ref 4.22–5.81)
RDW: 12.7 % (ref 11.5–15.5)
WBC: 6 10*3/uL (ref 4.0–10.5)

## 2016-12-09 LAB — LIPID PANEL
CHOL/HDL RATIO: 4
CHOLESTEROL: 217 mg/dL — AB (ref 0–200)
HDL: 56.9 mg/dL (ref >39.00)
LDL CALC: 141 mg/dL — AB (ref 0–99)
NonHDL: 159.96
TRIGLYCERIDES: 94 mg/dL (ref 0.0–149.0)
VLDL: 18.8 mg/dL (ref 0.0–40.0)

## 2016-12-09 LAB — HEPATIC FUNCTION PANEL
ALT: 48 U/L (ref 0–53)
AST: 20 U/L (ref 0–37)
Albumin: 4.3 g/dL (ref 3.5–5.2)
Alkaline Phosphatase: 94 U/L (ref 39–117)
BILIRUBIN TOTAL: 0.9 mg/dL (ref 0.2–1.2)
Bilirubin, Direct: 0.2 mg/dL (ref 0.0–0.3)
Total Protein: 6.8 g/dL (ref 6.0–8.3)

## 2016-12-09 LAB — HEMOGLOBIN A1C: Hgb A1c MFr Bld: 5.3 % (ref 4.6–6.5)

## 2016-12-09 LAB — TSH: TSH: 2.32 u[IU]/mL (ref 0.35–4.50)

## 2016-12-09 LAB — PSA: PSA: 0.39 ng/mL (ref 0.10–4.00)

## 2016-12-10 ENCOUNTER — Encounter: Payer: Self-pay | Admitting: Internal Medicine

## 2016-12-10 LAB — HIV ANTIBODY (ROUTINE TESTING W REFLEX): HIV 1&2 Ab, 4th Generation: NONREACTIVE

## 2016-12-10 NOTE — Telephone Encounter (Signed)
Ok to cancel Nov 2018 appt for this pt, thanks

## 2016-12-15 DIAGNOSIS — R590 Localized enlarged lymph nodes: Secondary | ICD-10-CM | POA: Diagnosis not present

## 2016-12-22 ENCOUNTER — Encounter: Payer: BLUE CROSS/BLUE SHIELD | Admitting: Internal Medicine

## 2017-02-03 ENCOUNTER — Other Ambulatory Visit: Payer: Self-pay | Admitting: Internal Medicine

## 2017-03-30 ENCOUNTER — Encounter: Payer: Self-pay | Admitting: Internal Medicine

## 2017-03-31 MED ORDER — LEVOCETIRIZINE DIHYDROCHLORIDE 5 MG PO TABS: 5.0000 mg | ORAL_TABLET | Freq: Every evening | ORAL | 2 refills | Status: DC

## 2017-03-31 MED ORDER — LANSOPRAZOLE 30 MG PO CPDR: 30.0000 mg | DELAYED_RELEASE_CAPSULE | Freq: Two times a day (BID) | ORAL | 8 refills | Status: DC

## 2017-03-31 MED ORDER — MONTELUKAST SODIUM 10 MG PO TABS: 10.0000 mg | ORAL_TABLET | Freq: Every day | ORAL | 2 refills | Status: DC

## 2017-03-31 MED ORDER — FLUTICASONE PROPIONATE 50 MCG/ACT NA SUSP: NASAL | 5 refills | Status: DC

## 2017-12-24 ENCOUNTER — Encounter: Payer: Self-pay | Admitting: Internal Medicine

## 2017-12-24 ENCOUNTER — Other Ambulatory Visit: Payer: Self-pay | Admitting: Internal Medicine

## 2017-12-24 NOTE — Telephone Encounter (Signed)
Documented flu shot.../LMB  

## 2018-02-08 ENCOUNTER — Ambulatory Visit (INDEPENDENT_AMBULATORY_CARE_PROVIDER_SITE_OTHER): Payer: BLUE CROSS/BLUE SHIELD | Admitting: Allergy and Immunology

## 2018-02-08 ENCOUNTER — Encounter: Payer: Self-pay | Admitting: Allergy and Immunology

## 2018-02-08 VITALS — BP 136/86 | HR 78 | Temp 98.3°F | Resp 16 | Ht 66.0 in | Wt 208.0 lb

## 2018-02-08 DIAGNOSIS — J4540 Moderate persistent asthma, uncomplicated: Secondary | ICD-10-CM

## 2018-02-08 DIAGNOSIS — J301 Allergic rhinitis due to pollen: Secondary | ICD-10-CM

## 2018-02-08 DIAGNOSIS — J3089 Other allergic rhinitis: Secondary | ICD-10-CM | POA: Diagnosis not present

## 2018-02-08 DIAGNOSIS — K219 Gastro-esophageal reflux disease without esophagitis: Secondary | ICD-10-CM

## 2018-02-08 DIAGNOSIS — T7800XA Anaphylactic reaction due to unspecified food, initial encounter: Secondary | ICD-10-CM | POA: Diagnosis not present

## 2018-02-08 MED ORDER — EPINEPHRINE 0.3 MG/0.3ML IJ SOAJ: 0.3000 mg | Freq: Once | INTRAMUSCULAR | 2 refills | Status: AC

## 2018-02-08 MED ORDER — MONTELUKAST SODIUM 10 MG PO TABS: 10.0000 mg | ORAL_TABLET | Freq: Every day | ORAL | 2 refills | Status: DC

## 2018-02-08 MED ORDER — LANSOPRAZOLE 30 MG PO CPDR: 30.0000 mg | DELAYED_RELEASE_CAPSULE | Freq: Two times a day (BID) | ORAL | 8 refills | Status: DC

## 2018-02-08 MED ORDER — ALBUTEROL SULFATE HFA 108 (90 BASE) MCG/ACT IN AERS: 2.0000 | INHALATION_SPRAY | Freq: Four times a day (QID) | RESPIRATORY_TRACT | 2 refills | Status: DC | PRN

## 2018-02-08 MED ORDER — BECLOMETHASONE DIPROPIONATE 40 MCG/ACT IN AERS: 2.0000 | INHALATION_SPRAY | Freq: Every day | RESPIRATORY_TRACT | 5 refills | Status: DC

## 2018-02-08 NOTE — Progress Notes (Signed)
Dear Dr. Jenny Reichmann,  Thank you for referring Billy Morris to the [facility] on 02/08/2018.   Below is a summation of this patient's evaluation and recommendations.  Thank you for your referral. I will keep you informed about this patient's response to treatment.   If you have any questions please do not hesitate to contact me.   Sincerely,  Jiles Prows, MD Allergy / Immunology [place]   ______________________________________________________________________    NEW PATIENT NOTE  Referring Provider: Biagio Borg, MD Primary Provider: Biagio Borg, MD Date of office visit: 02/08/2018    Subjective:   Chief Complaint:  Billy Morris (DOB: 1970/05/12) is a 47 y.o. male who presents to the clinic on 02/08/2018 with a chief complaint of Asthma .     HPI: Billy Morris presents to this clinic in evaluation of asthma and allergies and reflux.  Apparently I had seen him in this clinic approximately 14 years ago for similar issues.  His asthma has been under excellent control for many years and he has not used a bronchodilator and in fact does not have a short acting bronchodilator to use in greater than a year.  However, he has noticed that he has developed problems with chest tightness especially if he is outdoors exerting himself especially with leaf exposure.  He did have a controller agent available but he has not used a controller agent in over a year.  It does not sound as though he has required a systemic steroid to treat an exacerbation of asthma in many years.  He does have issues with his nose which is under very good control as long as he consistently uses montelukast and nasal fluticasone.  His nasal symptoms definitely occur on a perennial basis and flare during the spring and fall especially with pollen exposure.  Occasionally will get some itchy eyes with pollen exposure as well.  He has  also noticed that when eating beef products starting in 2008 he would develop diffuse hives and abdominal pain and diarrhea and vomits.  This apparently also occurred with deer consumption.  He can consume pork without any problem.  Since he has been mammal free other than pork consumption he has done very well without the development of these reactions in over a decade.  He has an issue with occasional reflux with burning and regurgitation.  He does consume caffeine multiple times a day and has chocolate a few times a week.  Past Medical History:  Diagnosis Date  . Allergic rhinitis   . Anal fissure   . Anxiety   . Asthma   . COPD (chronic obstructive pulmonary disease) ([place])   . Diverticulosis   . GERD (gastroesophageal reflux disease)   . H. pylori infection   . Hearing loss    chronic right  . Hiatal hernia   . Hyperlipidemia   . LBP (low back pain)   . TMJ (dislocation of temporomandibular joint)     Past Surgical History:  Procedure Laterality Date  . STAPEDECTOMY    . VASECTOMY      Allergies as of 02/08/2018      Reactions   Beef Extract Hives   All red meat causes hives   Ciprofloxacin Other (See Comments), Diarrhea   Bloody stool Bloody stool Stools have bloody mucous   Penicillins Rash   Sulfa Antibiotics Rash   Tiotropium Bromide Monohydrate Other (See  Comments)   heart racing and paresthesias   Aspirin    Other reaction(s): Other (See Comments)   Nabumetone    Other reaction(s): Other (See Comments) weakness weakness   Sulfonamide Derivatives       Medication List      albuterol 108 (90 Base) MCG/ACT inhaler Commonly known as:  PROAIR HFA INHALE TWO PUFFS 4 TIMES DAILY AS NEEDED FOR SHORTNESS OF BREATH   albuterol 108 (90 Base) MCG/ACT inhaler Commonly known as:  PROVENTIL HFA;VENTOLIN HFA Inhale 2 puffs into the lungs every 6 (six) hours as needed for wheezing or shortness of breath.   beclomethasone 40 MCG/ACT inhaler Commonly known as:   QVAR Inhale 2 puffs into the lungs daily.   EPINEPHrine 0.3 mg/0.3 mL Soaj injection Commonly known as:  AUVI-Q Inject 0.3 mLs (0.3 mg total) into the muscle once for 1 dose. As directed for life-threatening allergic reactions   fluticasone 50 MCG/ACT nasal spray Commonly known as:  FLONASE USE 2 SPRAY(S) IN EACH NOSTRIL ONCE DAILY   lansoprazole 30 MG capsule Commonly known as:  PREVACID Take 1 capsule (30 mg total) by mouth 2 (two) times daily.   levocetirizine 5 MG tablet Commonly known as:  XYZAL TAKE 1 TABLET BY MOUTH EVERY DAY IN THE EVENING   meloxicam 15 MG tablet Commonly known as:  MOBIC TAKE ONE TABLET BY MOUTH ONCE DAILY AS NEEDED   montelukast 10 MG tablet Commonly known as:  SINGULAIR Take 1 tablet (10 mg total) by mouth daily.   nystatin powder Commonly known as:  MYCOSTATIN/NYSTOP Use as directed twice per day to affected area as needed       Review of systems negative except as noted in HPI / PMHx or noted below:  Review of Systems  Constitutional: Negative.   HENT: Negative.   Eyes: Negative.   Respiratory: Negative.   Cardiovascular: Negative.   Gastrointestinal: Negative.   Genitourinary: Negative.   Musculoskeletal: Negative.   Skin: Negative.   Neurological: Negative.   Endo/Heme/Allergies: Negative.   Psychiatric/Behavioral: Negative.     Family History  Problem Relation Age of Onset  . Hyperlipidemia Father   . Other Mother        ibs  . Hyperlipidemia Mother   . Irritable bowel syndrome Maternal Aunt        x4  . COPD Maternal Grandfather   . Prostate cancer Maternal Grandfather   . Colon polyps Maternal Grandfather   . Allergies Other        children  . Asthma Son   . Heart failure Other        paternal great grandmother  . Heart attack Maternal Grandmother   . Diabetes Maternal Grandmother   . Stroke Maternal Aunt   . Ovarian cancer Paternal Grandmother     Social History   Socioeconomic History  . Marital status:  Married    Spouse name: Not on file  . Number of children: 2  . Years of education: Not on file  . Highest education level: Not on file  Occupational History  . Occupation: Financial controller  Social Needs  . Financial resource strain: Not on file  . Food insecurity:    Worry: Not on file    Inability: Not on file  . Transportation needs:    Medical: Not on file    Non-medical: Not on file  Tobacco Use  . Smoking status: Never Smoker  . Smokeless tobacco: Never Used  Substance and Sexual Activity  .  Alcohol use: No  . Drug use: No  . Sexual activity: Not on file  Lifestyle  . Physical activity:    Days per week: Not on file    Minutes per session: Not on file  . Stress: Not on file  Relationships  . Social connections:    Talks on phone: Not on file    Gets together: Not on file    Attends religious service: Not on file    Active member of club or organization: Not on file    Attends meetings of clubs or organizations: Not on file    Relationship status: Not on file  . Intimate partner violence:    Fear of current or ex partner: Not on file    Emotionally abused: Not on file    Physically abused: Not on file    Forced sexual activity: Not on file  Other Topics Concern  . Not on file  Social History Narrative  . Not on file    Environmental and Social history  Lives in a apartment with a dry environment, no animals located inside the household, carpet in the bedroom, no plastic on the bed, no plastic on the pillow, no smokers located inside the household.  He works in a [employer].  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  Objective:   Vitals:   02/08/18 0856  BP: 136/86  Pulse: 78  Resp: 16  Temp: 98.3 F (36.8 C)  SpO2: 98%   Height: 5\' 6"  (167.6 cm) Weight: 208 lb (94.3 kg)  Physical Exam Constitutional:      Appearance: He is not diaphoretic.  HENT:     Head: Normocephalic. No right periorbital  erythema or left periorbital erythema.     Right Ear: Tympanic membrane, ear canal and external ear normal.     Left Ear: Tympanic membrane, ear canal and external ear normal.     Nose: Nose normal. No mucosal edema or rhinorrhea.     Mouth/Throat:     Pharynx: No oropharyngeal exudate.  Eyes:     General: Lids are normal.     Conjunctiva/sclera: Conjunctivae normal.     Pupils: Pupils are equal, round, and reactive to light.  Neck:     Thyroid: No thyromegaly.     Trachea: Trachea normal. No tracheal deviation.  Cardiovascular:     Rate and Rhythm: Normal rate and regular rhythm.     Heart sounds: Normal heart sounds, S1 normal and S2 normal. No murmur.  Pulmonary:     Effort: Pulmonary effort is normal. No respiratory distress.     Breath sounds: No stridor. No wheezing or rales.  Chest:     Chest wall: No tenderness.  Abdominal:     General: There is no distension.     Palpations: Abdomen is soft. There is no mass.     Tenderness: There is no abdominal tenderness. There is no guarding or rebound.  Musculoskeletal:        General: No tenderness.  Lymphadenopathy:     Head:     Right side of head: No tonsillar adenopathy.     Left side of head: No tonsillar adenopathy.     Cervical: No cervical adenopathy.  Skin:    Coloration: Skin is not pale.     Findings: No erythema or rash.     Nails: There is no clubbing.   Neurological:     Mental Status: He is alert.  Diagnostics: Allergy skin tests were performed.  He demonstrated hypersensitivity against grasses, weeds, trees, cat, dog, house dust mite, and mold.  He did not demonstrate any hypersensitivity against food products.  Spirometry was performed and demonstrated an FEV1 of 2.61 @ 74 % of predicted. FEV1/FVC = 2.61  Assessment and Plan:    1. Not well controlled moderate persistent asthma   2. Perennial allergic rhinitis   3. Seasonal allergic rhinitis due to pollen   4. Anaphylactic shock due to food,  initial encounter   5. Gastroesophageal reflux disease, esophagitis presence not specified     1.  Allergen avoidance measures  2.  Treat and prevent inflammation:   A.  Flonase -2 sprays each nostril daily  B.  Montelukast 10 mg -1 tablet daily  C.  Qvar 80 Respiclick -2 inhalations daily  3.  Treat and prevent reflux:   A.  Continue Prevacid 30 mg -1 tablet daily  B.  Consolidate caffeine and chocolate consumption  4.  If needed:   A.  Pro-air HFA -2 inhalations every 4-6 hours  B.  Xyzal 5 mg -1 tablet daily  C.  Auvi-Q 0.3, Benadryl, MD/ER evaluation for allergic reaction  5.  Blood -alpha gal panel  6.  In clinic food challenge?  7.  Immunotherapy?  8.  Return to clinic in 4 weeks or earlier if problem  Dhillon obviously has an atopic immune system and we will get him to perform allergen avoidance measures as best as possible and utilize a collection of anti-inflammatory agents for his airway as noted above.  In addition, we will treat him for reflux by having him consolidate his caffeine consumption and using a proton pump inhibitor.  An alpha gal panel will be checked to work through the issue of his apparent beef and deer hypersensitivity.  I will see him back in this clinic in 4 weeks or earlier to assess his response to this approach and consider further evaluation treatment based upon his response and the results of his diagnostic testing.  Jiles Prows, MD Allergy / Immunology [facility]

## 2018-02-08 NOTE — Patient Instructions (Addendum)
  1.  Allergen avoidance measures  2.  Treat and prevent inflammation:   A.  Flonase -2 sprays each nostril daily  B.  Montelukast 10 mg -1 tablet daily  C.  Qvar 80 Respiclick -2 inhalations daily  3.  Treat and prevent reflux:   A.  Continue Prevacid 30 mg -1 tablet daily  B.  Consolidate caffeine and chocolate consumption  4.  If needed:   A.  Pro-air HFA -2 inhalations every 4-6 hours  B.  Xyzal 5 mg -1 tablet daily  C.  Auvi-Q 0.3, Benadryl, MD/ER evaluation for allergic reaction  5.  Blood -alpha gal panel  6.  In clinic food challenge?  7.  Immunotherapy?  8.  Return to clinic in 4 weeks or earlier if problem

## 2018-02-10 ENCOUNTER — Encounter: Payer: Self-pay | Admitting: Allergy and Immunology

## 2018-02-10 NOTE — Addendum Note (Signed)
Addended by: Lucrezia Starch I on: 02/10/2018 08:29 AM   Modules accepted: Orders

## 2018-02-14 LAB — ALPHA-GAL PANEL
Alpha Gal IgE*: 30 kU/L — ABNORMAL HIGH (ref <0.10)
BEEF CLASS INTERPRETATION: 3
Beef (Bos spp) IgE: 10.9 kU/L — ABNORMAL HIGH (ref <0.35)
Class Interpretation: 2
LAMB CLASS INTERPRETATION: 2
LAMB/MUTTON (OVIS SPP) IGE: 0.95 kU/L — AB (ref <0.35)
Pork (Sus spp) IgE: 3.41 kU/L — ABNORMAL HIGH (ref <0.35)

## 2018-03-08 ENCOUNTER — Ambulatory Visit (INDEPENDENT_AMBULATORY_CARE_PROVIDER_SITE_OTHER): Payer: BLUE CROSS/BLUE SHIELD | Admitting: Allergy and Immunology

## 2018-03-08 ENCOUNTER — Encounter: Payer: Self-pay | Admitting: Allergy and Immunology

## 2018-03-08 VITALS — BP 114/72 | HR 76 | Resp 18 | Ht 65.5 in | Wt 203.0 lb

## 2018-03-08 DIAGNOSIS — J4540 Moderate persistent asthma, uncomplicated: Secondary | ICD-10-CM | POA: Diagnosis not present

## 2018-03-08 DIAGNOSIS — T7800XA Anaphylactic reaction due to unspecified food, initial encounter: Secondary | ICD-10-CM | POA: Diagnosis not present

## 2018-03-08 DIAGNOSIS — J069 Acute upper respiratory infection, unspecified: Secondary | ICD-10-CM

## 2018-03-08 DIAGNOSIS — B9789 Other viral agents as the cause of diseases classified elsewhere: Secondary | ICD-10-CM

## 2018-03-08 DIAGNOSIS — J301 Allergic rhinitis due to pollen: Secondary | ICD-10-CM | POA: Diagnosis not present

## 2018-03-08 DIAGNOSIS — J3089 Other allergic rhinitis: Secondary | ICD-10-CM | POA: Diagnosis not present

## 2018-03-08 DIAGNOSIS — K219 Gastro-esophageal reflux disease without esophagitis: Secondary | ICD-10-CM

## 2018-03-08 NOTE — Progress Notes (Signed)
Follow-up Note  Referring Provider: Biagio Borg, MD Primary Provider: Biagio Borg, MD Date of Office Visit: 03/08/2018  Subjective:   Billy Morris (DOB: 08-21-1970) is a 48 y.o. male who returns to the [facility] [place] on 03/08/2018 in re-evaluation of the following:  HPI: Billy Morris returns to this clinic in reevaluation of asthma and allergic rhinitis and reflux.  I last saw him in this clinic during his initial evaluation of 08 February 2018 at which point in time we addressed each issue.  He really feels as though he is doing much better at this point in time regarding his airway.  He has very little issues with chest tightness or shortness of breath and requirement for short acting bronchodilator and his nose is really doing very well.   He has eliminated all caffeine consumption and has been using a proton pump inhibitor twice a day and all of his burning in his chest and regurgitation is gone.  He remains away from eating all beef products because of his allergic reaction that occurred in the past with this form of consumption.  However, yesterday he developed a raw throat and a little bit of chest tightness and some slight cough.  He had contact with individuals who were sick last week.  He has not had any high fever or ugly nasal discharge or headache or anosmia or chest pain or sputum production.  Allergies as of 03/08/2018      Reactions   Beef Extract Hives   All red meat causes hives   Ciprofloxacin Other (See Comments), Diarrhea   Bloody stool Bloody stool Stools have bloody mucous   Penicillins Rash   Sulfa Antibiotics Rash   Tiotropium Bromide Monohydrate Other (See Comments)   heart racing and paresthesias   Aspirin    Other reaction(s): Other (See Comments)   Nabumetone    Other reaction(s): Other (See Comments) weakness weakness   Sulfonamide Derivatives       Medication List      albuterol 108 (90 Base) MCG/ACT inhaler Commonly known as:   PROAIR HFA INHALE TWO PUFFS 4 TIMES DAILY AS NEEDED FOR SHORTNESS OF BREATH   beclomethasone 40 MCG/ACT inhaler Commonly known as:  QVAR Inhale 2 puffs into the lungs daily.   fluticasone 50 MCG/ACT nasal spray Commonly known as:  FLONASE USE 2 SPRAY(S) IN EACH NOSTRIL ONCE DAILY   lansoprazole 30 MG capsule Commonly known as:  PREVACID Take 1 capsule (30 mg total) by mouth 2 (two) times daily.   levocetirizine 5 MG tablet Commonly known as:  XYZAL TAKE 1 TABLET BY MOUTH EVERY DAY IN THE EVENING   meloxicam 15 MG tablet Commonly known as:  MOBIC TAKE ONE TABLET BY MOUTH ONCE DAILY AS NEEDED   montelukast 10 MG tablet Commonly known as:  SINGULAIR Take 1 tablet (10 mg total) by mouth daily.   nystatin powder Commonly known as:  MYCOSTATIN/NYSTOP Use as directed twice per day to affected area as needed       Past Medical History:  Diagnosis Date  . Allergic rhinitis   . Anal fissure   . Anxiety   . Asthma   . COPD (chronic obstructive pulmonary disease) ([place])   . Diverticulosis   . GERD (gastroesophageal reflux disease)   . H. pylori infection   . Hearing loss    chronic right  . Hiatal hernia   . Hyperlipidemia   . LBP (low back pain)   .  TMJ (dislocation of temporomandibular joint)     Past Surgical History:  Procedure Laterality Date  . STAPEDECTOMY    . VASECTOMY      Review of systems negative except as noted in HPI / PMHx or noted below:  Review of Systems  Constitutional: Negative.   HENT: Negative.   Eyes: Negative.   Respiratory: Negative.   Cardiovascular: Negative.   Gastrointestinal: Negative.   Genitourinary: Negative.   Musculoskeletal: Negative.   Skin: Negative.   Neurological: Negative.   Endo/Heme/Allergies: Negative.   Psychiatric/Behavioral: Negative.      Objective:   Vitals:   03/08/18 1004  BP: 114/72  Pulse: 76  Resp: 18  SpO2: 98%   Height: 5' 5.5" (166.4 cm)  Weight: 203 lb (92.1 kg)   Physical  Exam Constitutional:      Appearance: He is not diaphoretic.  HENT:     Head: Normocephalic.     Right Ear: Tympanic membrane, ear canal and external ear normal.     Left Ear: Tympanic membrane, ear canal and external ear normal.     Nose: Mucosal edema (Erythematous) present. No rhinorrhea.     Mouth/Throat:     Pharynx: Uvula midline. Posterior oropharyngeal erythema present. No oropharyngeal exudate.  Eyes:     Conjunctiva/sclera: Conjunctivae normal.  Neck:     Thyroid: No thyromegaly.     Trachea: Trachea normal. No tracheal tenderness or tracheal deviation.  Cardiovascular:     Rate and Rhythm: Normal rate and regular rhythm.     Heart sounds: Normal heart sounds, S1 normal and S2 normal. No murmur.  Pulmonary:     Effort: No respiratory distress.     Breath sounds: Normal breath sounds. No stridor. No wheezing or rales.  Lymphadenopathy:     Head:     Right side of head: No tonsillar adenopathy.     Left side of head: No tonsillar adenopathy.     Cervical: No cervical adenopathy.  Skin:    Findings: No erythema or rash.     Nails: There is no clubbing.   Neurological:     Mental Status: He is alert.     Diagnostics:    Spirometry was performed and demonstrated an FEV1 of 2.72 at 77 % of predicted.  The patient had an Asthma Control Test with the following results: ACT Total Score: 22.    Results of blood tests obtained 08 February 2018 identified alpha gal IgE 30 KU/L, beef 10.9 KU/L, pork 3.41 KU/L, Lamb 0.95 KU/L  Assessment and Plan:   1. Asthma, moderate persistent, well-controlled   2. Perennial allergic rhinitis   3. Seasonal allergic rhinitis due to pollen   4. Anaphylactic shock due to food, initial encounter   5. Gastroesophageal reflux disease, esophagitis presence not specified   6. Viral upper respiratory tract infection with cough     1.  Continue to perform allergen avoidance measures  2.  Continue to treat and prevent inflammation:   A.   Flonase -2 sprays each nostril daily  B.  Montelukast 10 mg -1 tablet daily  C.  Qvar 80 Respiclick -2 inhalations daily  3.  Continue to treat and prevent reflux:   A.  Continue Prevacid 30 mg -1 tablet 2 times a day  B.  Continue to eliminate caffeine and chocolate consumption  4.  If needed:   A.  Pro-air HFA -2 inhalations every 4-6 hours  B.  Xyzal 5 mg -1 tablet daily  C.  Auvi-Q  0.3, Benadryl, MD/ER evaluation for allergic reaction  5.  For this most recent episode utilize the following:   A.  Increase Qvar to 3 inhalations 3 times per day  B.  Can use OTC Mucinex DM 2 tablets twice a day  C.  Can use OTC ibuprofen  D.  Can use OTC nasal saline  6.  Return to clinic in 8 weeks or earlier if problem. Taper?  Atha appears to have sustained a viral respiratory tract infection and we will treat him conservatively as noted above.  He has definitely noticed significant improvement on his current plan of anti-inflammatory agents and therapy directed against reflux in conjunction with allergen avoidance measures and he will continue to utilize this plan for a full 12 weeks.  Thus, I will see him back in his clinic in 8 weeks and if he continues to do well we will see if we can taper down his medications.  Allena Katz, MD Allergy / Immunology [place]

## 2018-03-08 NOTE — Patient Instructions (Signed)
  1.  Continue to perform allergen avoidance measures  2.  Continue to treat and prevent inflammation:   A.  Flonase -2 sprays each nostril daily  B.  Montelukast 10 mg -1 tablet daily  C.  Qvar 80 Respiclick -2 inhalations daily  3.  Continue to treat and prevent reflux:   A.  Continue Prevacid 30 mg -1 tablet 2 times a day  B.  Continue to eliminate caffeine and chocolate consumption  4.  If needed:   A.  Pro-air HFA -2 inhalations every 4-6 hours  B.  Xyzal 5 mg -1 tablet daily  C.  Auvi-Q 0.3, Benadryl, MD/ER evaluation for allergic reaction  5.  For this most recent episode utilize the following:   A.  Increase Qvar to 3 inhalations 3 times per day  B.  Can use OTC Mucinex DM 2 tablets twice a day  C.  Can use OTC ibuprofen  D.  Can use OTC nasal saline  6.  Return to clinic in 8 weeks or earlier if problem. Taper?

## 2018-03-09 ENCOUNTER — Encounter: Payer: Self-pay | Admitting: Allergy and Immunology

## 2018-04-25 ENCOUNTER — Encounter: Payer: Self-pay | Admitting: Allergy and Immunology

## 2018-05-03 ENCOUNTER — Ambulatory Visit (INDEPENDENT_AMBULATORY_CARE_PROVIDER_SITE_OTHER): Payer: BLUE CROSS/BLUE SHIELD | Admitting: Allergy and Immunology

## 2018-05-03 ENCOUNTER — Encounter: Payer: Self-pay | Admitting: Allergy and Immunology

## 2018-05-03 ENCOUNTER — Other Ambulatory Visit: Payer: Self-pay

## 2018-05-03 VITALS — BP 150/92 | HR 68 | Resp 16 | Ht 66.0 in | Wt 200.0 lb

## 2018-05-03 DIAGNOSIS — J301 Allergic rhinitis due to pollen: Secondary | ICD-10-CM

## 2018-05-03 DIAGNOSIS — J3089 Other allergic rhinitis: Secondary | ICD-10-CM

## 2018-05-03 DIAGNOSIS — J4540 Moderate persistent asthma, uncomplicated: Secondary | ICD-10-CM

## 2018-05-03 DIAGNOSIS — T7800XA Anaphylactic reaction due to unspecified food, initial encounter: Secondary | ICD-10-CM | POA: Diagnosis not present

## 2018-05-03 DIAGNOSIS — K219 Gastro-esophageal reflux disease without esophagitis: Secondary | ICD-10-CM

## 2018-05-03 NOTE — Progress Notes (Signed)
[facility] [place] [facility] [place]   Follow-up Note  Referring Provider: Biagio Borg, MD Primary Provider: Biagio Borg, MD Date of Office Visit: 05/03/2018  Subjective:   Billy Morris (DOB: September 05, 1970) is a 48 y.o. male who returns to the [facility] [place] on 05/03/2018 in re-evaluation of the following:  HPI: Billy Morris returns to this clinic in reevaluation of his asthma and allergic rhinitis and reflux and alpha gal syndrome.  His last visit to this clinic was 08 March 2018.  Overall his asthma has been under excellent control and he rarely uses a short acting bronchodilator and he has not required a systemic steroid or an antibiotic for any type of respiratory tract issue.  He feels that the use of Qvar twice a day results in better control of his asthma.  He has had very little issues with his nose at this point time.  He would like to stop his montelukast as he does not really feel that it helps him at all and he is just relying on the use of a nasal steroid.  His reflux has been under very good control.  Occasionally he does eat some chocolate but he understands the relationship between caffeine consumption and his reflux.  He continues on a proton pump inhibitor.  He remains away from all mammal product consumption  Allergies as of 05/03/2018      Reactions   Beef Extract Hives   All red meat causes hives   Ciprofloxacin Other (See Comments), Diarrhea   Bloody stool Bloody stool Stools have bloody mucous   Penicillins Rash   Sulfa Antibiotics Rash   Tiotropium Bromide Monohydrate Other (See Comments)   heart racing and paresthesias   Aspirin    Other reaction(s): Other (See Comments)   Nabumetone    Other reaction(s): Other (See Comments) weakness weakness   Sulfonamide Derivatives       Medication List      albuterol 108 (90 Base) MCG/ACT inhaler Commonly known as:  ProAir HFA INHALE TWO PUFFS 4 TIMES DAILY AS NEEDED FOR  SHORTNESS OF BREATH   beclomethasone 40 MCG/ACT inhaler Commonly known as:  QVAR Inhale 2 puffs into the lungs daily.   fluticasone 50 MCG/ACT nasal spray Commonly known as:  FLONASE USE 2 SPRAY(S) IN EACH NOSTRIL ONCE DAILY   lansoprazole 30 MG capsule Commonly known as:  PREVACID Take 1 capsule (30 mg total) by mouth 2 (two) times daily.   levocetirizine 5 MG tablet Commonly known as:  XYZAL TAKE 1 TABLET BY MOUTH EVERY DAY IN THE EVENING   meloxicam 15 MG tablet Commonly known as:  MOBIC TAKE ONE TABLET BY MOUTH ONCE DAILY AS NEEDED   montelukast 10 MG tablet Commonly known as:  SINGULAIR Take 1 tablet (10 mg total) by mouth daily.   nystatin powder Commonly known as:  MYCOSTATIN/NYSTOP Use as directed twice per day to affected area as needed   Qvar RediHaler 40 MCG/ACT inhaler Generic drug:  beclomethasone INHALE 2 PUFFS BY MOUTH INTO THE LUNGS DAILY       Past Medical History:  Diagnosis Date  . Allergic rhinitis   . Anal fissure   . Anxiety   . Asthma   . COPD (chronic obstructive pulmonary disease) ([place])   . Diverticulosis   . GERD (gastroesophageal reflux disease)   . H. pylori infection   . Hearing loss    chronic right  . Hiatal hernia   .  Hyperlipidemia   . LBP (low back pain)   . TMJ (dislocation of temporomandibular joint)     Past Surgical History:  Procedure Laterality Date  . STAPEDECTOMY    . VASECTOMY      Review of systems negative except as noted in HPI / PMHx or noted below:  Review of Systems  Constitutional: Negative.   HENT: Negative.   Eyes: Negative.   Respiratory: Negative.   Cardiovascular: Negative.   Gastrointestinal: Negative.   Genitourinary: Negative.   Musculoskeletal: Negative.   Skin: Negative.   Neurological: Negative.   Endo/Heme/Allergies: Negative.   Psychiatric/Behavioral: Negative.      Objective:   Vitals:   05/03/18 1129  BP: (!) 150/92  Pulse: 68  Resp: 16  SpO2: 98%   Height: 5\' 6"   (167.6 cm)  Weight: 200 lb (90.7 kg)   Physical Exam Constitutional:      Appearance: He is not diaphoretic.  HENT:     Head: Normocephalic.     Right Ear: Tympanic membrane, ear canal and external ear normal.     Left Ear: Tympanic membrane, ear canal and external ear normal.     Nose: Nose normal. No mucosal edema or rhinorrhea.     Mouth/Throat:     Pharynx: Uvula midline. No oropharyngeal exudate.  Eyes:     Conjunctiva/sclera: Conjunctivae normal.  Neck:     Thyroid: No thyromegaly.     Trachea: Trachea normal. No tracheal tenderness or tracheal deviation.  Cardiovascular:     Rate and Rhythm: Normal rate and regular rhythm.     Heart sounds: Normal heart sounds, S1 normal and S2 normal. No murmur.  Pulmonary:     Effort: No respiratory distress.     Breath sounds: Normal breath sounds. No stridor. No wheezing or rales.  Lymphadenopathy:     Head:     Right side of head: No tonsillar adenopathy.     Left side of head: No tonsillar adenopathy.     Cervical: No cervical adenopathy.  Skin:    Findings: No erythema or rash.     Nails: There is no clubbing.   Neurological:     Mental Status: He is alert.     Diagnostics:    Spirometry was performed and demonstrated an FEV1 of 2.44 at 69 % of predicted.  Assessment and Plan:   1. Asthma, moderate persistent, well-controlled   2. Perennial allergic rhinitis   3. Seasonal allergic rhinitis due to pollen   4. Anaphylactic shock due to food, initial encounter   5. Gastroesophageal reflux disease, esophagitis presence not specified     1.  Continue to perform allergen avoidance measures  2.  Continue to treat and prevent inflammation:   A.  Flonase -2 sprays each nostril daily  B.  Qvar 80 Respiclick -2 inhalations 1-2 times per day  3.  Continue to treat and prevent reflux:   A.  Continue Prevacid 30 mg -1 tablet 2 times a day  B.  Continue to minimize caffeine and chocolate consumption  4.  If needed:   A.   Pro-air HFA -2 inhalations every 4-6 hours  B.  Xyzal 5 mg -1 tablet daily  C.  Auvi-Q 0.3, Benadryl, MD/ER evaluation for allergic reaction  5.  "Action Plan" for flare up::   A.  Increase Qvar to 3 inhalations 3 times per day  B.  Albuterol HFA if needed.   6.  Return to clinic in summer 2020 or earlier if problem  Benjamim appears to be doing quite well and he has a very good understanding of how his medications work and the correct dosing of his medications and he will remain on anti-inflammatory agents for his airway and therapy directed against reflux as noted above and I will see him back in this clinic in the summer 2020 or earlier if there is a problem.  Allena Katz, MD Allergy / Immunology [place]

## 2018-05-03 NOTE — Patient Instructions (Addendum)
  1.  Continue to perform allergen avoidance measures  2.  Continue to treat and prevent inflammation:   A.  Flonase -2 sprays each nostril daily  B.  Qvar 80 Respiclick -2 inhalations 1-2 times per day  3.  Continue to treat and prevent reflux:   A.  Continue Prevacid 30 mg -1 tablet 2 times a day  B.  Continue to minimize caffeine and chocolate consumption  4.  If needed:   A.  Pro-air HFA -2 inhalations every 4-6 hours  B.  Xyzal 5 mg -1 tablet daily  C.  Auvi-Q 0.3, Benadryl, MD/ER evaluation for allergic reaction  5.  "Action Plan" for flare up::   A.  Increase Qvar to 3 inhalations 3 times per day  B.  Albuterol HFA if needed.   6.  Return to clinic in summer 2020 or earlier if problem

## 2018-05-04 ENCOUNTER — Encounter: Payer: Self-pay | Admitting: Allergy and Immunology

## 2018-05-06 ENCOUNTER — Other Ambulatory Visit: Payer: Self-pay | Admitting: Internal Medicine

## 2018-05-07 ENCOUNTER — Encounter: Payer: Self-pay | Admitting: Internal Medicine

## 2018-05-08 ENCOUNTER — Encounter: Payer: Self-pay | Admitting: Allergy and Immunology

## 2018-05-09 ENCOUNTER — Encounter: Payer: Self-pay | Admitting: Internal Medicine

## 2018-05-09 ENCOUNTER — Ambulatory Visit (INDEPENDENT_AMBULATORY_CARE_PROVIDER_SITE_OTHER): Payer: BLUE CROSS/BLUE SHIELD | Admitting: Internal Medicine

## 2018-05-09 DIAGNOSIS — M545 Low back pain, unspecified: Secondary | ICD-10-CM

## 2018-05-09 MED ORDER — TRAMADOL HCL 50 MG PO TABS: 50.0000 mg | ORAL_TABLET | Freq: Four times a day (QID) | ORAL | 0 refills | Status: DC | PRN

## 2018-05-09 MED ORDER — PREDNISONE 10 MG PO TABS: ORAL_TABLET | ORAL | 0 refills | Status: DC

## 2018-05-09 MED ORDER — CYCLOBENZAPRINE HCL 5 MG PO TABS: 5.0000 mg | ORAL_TABLET | Freq: Three times a day (TID) | ORAL | 1 refills | Status: DC | PRN

## 2018-05-09 NOTE — Assessment & Plan Note (Signed)
See above

## 2018-05-09 NOTE — Telephone Encounter (Signed)
-----   Message from Jiles Prows, MD sent at 05/09/2018 12:13 PM EDT ----- Please provide patient a letter stating that because of his severe asthma and his risk of having a bad outcome with Covid infection it would be in his best interest if he did not work in an environment where he was exposed to people.

## 2018-05-09 NOTE — Patient Instructions (Signed)
See above

## 2018-05-09 NOTE — Progress Notes (Signed)
Patient ID: Billy Morris, male   DOB: 1970/12/22, 48 y.o.   MRN: [id number]  Virtual Visit via Video Note  I connected with Billy Morris on 05/09/18 at  3:00 PM EDT by a video enabled telemedicine application and verified that I am speaking with the correct person using two identifiers.  Pt is at home, and I am at office.  No others present   I discussed the limitations of evaluation and management by telemedicine and the availability of in person appointments. The patient expressed understanding and agreed to proceed.  History of Present Illness: Here with c/o 2 wks onset low back pain, midline, moderate to severe now 9/10 severity, constant, dull and sharp with radiation to the buttocks bilat with bending forward, even worse to pick up heavy objects at work.  Pt denies bowel or bladder change, fever, wt loss,  worsening LE pain/numbness/weakness, or falls. 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  No better with ice, or ibuprofen, though mobic yesterday did help for several hours.  Otherwise stands all day at work, no light duty possible at work.  Has hx of recurrent pain related to lumbar DDD intermittent since his 20's.  Has been mostly left low back related for many years, but now more right sided.  Asking for pain control and work note. Past Medical History:  Diagnosis Date  . Allergic rhinitis   . Anal fissure   . Anxiety   . Asthma   . COPD (chronic obstructive pulmonary disease) ([place])   . Diverticulosis   . GERD (gastroesophageal reflux disease)   . H. pylori infection   . Hearing loss    chronic right  . Hiatal hernia   . Hyperlipidemia   . LBP (low back pain)   . TMJ (dislocation of temporomandibular joint)    Past Surgical History:  Procedure Laterality Date  . STAPEDECTOMY    . VASECTOMY      reports that he has never smoked. He has never used smokeless tobacco. He reports that he does not drink alcohol or use drugs. family  history includes Allergies in an other family member; Asthma in his son; COPD in his maternal grandfather; Colon polyps in his maternal grandfather; Diabetes in his maternal grandmother; Heart attack in his maternal grandmother; Heart failure in an other family member; Hyperlipidemia in his father and mother; Irritable bowel syndrome in his maternal aunt; Other in his mother; Ovarian cancer in his paternal grandmother; Prostate cancer in his maternal grandfather; Stroke in his maternal aunt. Allergies  Allergen Reactions  . Beef Extract Hives    All red meat causes hives  . Ciprofloxacin Other (See Comments) and Diarrhea    Bloody stool Bloody stool Stools have bloody mucous  . Penicillins Rash  . Sulfa Antibiotics Rash  . Tiotropium Bromide Monohydrate Other (See Comments)    heart racing and paresthesias  . Aspirin     Other reaction(s): Other (See Comments)  . Nabumetone     Other reaction(s): Other (See Comments) weakness weakness  . Sulfonamide Derivatives    Current Outpatient Medications on File Prior to Visit  Medication Sig Dispense Refill  . albuterol (PROAIR HFA) 108 (90 Base) MCG/ACT inhaler INHALE TWO PUFFS 4 TIMES DAILY AS NEEDED FOR SHORTNESS OF BREATH 1 each 6  . beclomethasone (QVAR) 40 MCG/ACT inhaler Inhale 2 puffs into the lungs daily. 1 Inhaler 5  . fluticasone (FLONASE) 50 MCG/ACT nasal spray USE 2  SPRAY(S) IN EACH NOSTRIL ONCE DAILY 16 g 1  . lansoprazole (PREVACID) 30 MG capsule Take 1 capsule (30 mg total) by mouth 2 (two) times daily. 60 capsule 8  . levocetirizine (XYZAL) 5 MG tablet TAKE 1 TABLET BY MOUTH EVERY DAY IN THE EVENING 90 tablet 2  . meloxicam (MOBIC) 15 MG tablet TAKE ONE TABLET BY MOUTH ONCE DAILY AS NEEDED 30 tablet 8  . montelukast (SINGULAIR) 10 MG tablet Take 1 tablet (10 mg total) by mouth daily. 90 tablet 2  . nystatin (MYCOSTATIN/NYSTOP) powder Use as directed twice per day to affected area as needed 30 g 2  . QVAR REDIHALER 40 MCG/ACT  inhaler INHALE 2 PUFFS BY MOUTH INTO THE LUNGS DAILY     No current facility-administered medications on file prior to visit.      Observations/Objective: Alert, appears to grimace in pain, afeb o/w NAD Lab Results  Component Value Date   WBC 6.0 12/09/2016   HGB 14.3 12/09/2016   HCT 41.8 12/09/2016   PLT 243.0 12/09/2016   GLUCOSE 92 12/09/2016   CHOL 217 (H) 12/09/2016   TRIG 94.0 12/09/2016   HDL 56.90 12/09/2016   LDLCALC 141 (H) 12/09/2016   ALT 48 12/09/2016   AST 20 12/09/2016   NA 140 12/09/2016   K 4.4 12/09/2016   CL 103 12/09/2016   CREATININE 0.79 12/09/2016   BUN 16 12/09/2016   CO2 29 12/09/2016   TSH 2.32 12/09/2016   PSA 0.39 12/09/2016   HGBA1C 5.3 12/09/2016   Assessment and Plan: Acute on chronic low back pain - more right sided currently and unusual for him, severe, worse to bending and lifting, but without sciatica, bowel or bladder symptoms, or LE neuro symptoms.  Differential includes lumbar DDD, disc herniation or tear, or other msk strain.  For mobic prn, tramadol prn breakthrough, flexeril prn, and short course low dose prednisone.    Follow Up Instructions: To f/u with any worsening s/s including pain, gait change, falls, bowel or bladder changes, or neuritic symptoms.  Work note to be sent by mail   I discussed the assessment and treatment plan with the patient. The patient was provided an opportunity to ask questions and all were answered. The patient agreed with the plan and demonstrated an understanding of the instructions.   The patient was advised to call back or seek an in-person evaluation if the symptoms worsen or if the condition fails to improve as anticipated.   Cathlean Cower, MD

## 2018-05-10 MED ORDER — CYCLOBENZAPRINE HCL 5 MG PO TABS: 5.0000 mg | ORAL_TABLET | Freq: Three times a day (TID) | ORAL | 1 refills | Status: DC | PRN

## 2018-05-11 ENCOUNTER — Telehealth: Payer: Self-pay | Admitting: *Deleted

## 2018-05-11 NOTE — Telephone Encounter (Signed)
Letter sent to patient via mychart.  

## 2018-05-12 ENCOUNTER — Encounter: Payer: Self-pay | Admitting: *Deleted

## 2018-05-12 ENCOUNTER — Telehealth: Payer: Self-pay | Admitting: Allergy and Immunology

## 2018-05-12 NOTE — Telephone Encounter (Signed)
Informed patient letter has been corrected. Letter sent via mychart and signed copy sent in the mail to patient.

## 2018-05-12 NOTE — Telephone Encounter (Signed)
Dr. Neldon Mc wrote a letter for Billy Morris, stating he could not go to work. Billy Morris called and read me a sentence out of the letter that he said is not right. It is the second sentence that says it is NOT recommended that Billy Morris not work. He stated that needs to be changed to, it is recommended that he not work.

## 2018-05-13 ENCOUNTER — Encounter: Payer: Self-pay | Admitting: Internal Medicine

## 2018-05-31 ENCOUNTER — Other Ambulatory Visit: Payer: Self-pay | Admitting: Internal Medicine

## 2018-06-13 ENCOUNTER — Encounter: Payer: Self-pay | Admitting: Allergy and Immunology

## 2018-06-22 ENCOUNTER — Other Ambulatory Visit: Payer: Self-pay | Admitting: Internal Medicine

## 2018-08-30 ENCOUNTER — Other Ambulatory Visit: Payer: Self-pay

## 2018-08-30 ENCOUNTER — Encounter: Payer: Self-pay | Admitting: Allergy and Immunology

## 2018-08-30 ENCOUNTER — Ambulatory Visit (INDEPENDENT_AMBULATORY_CARE_PROVIDER_SITE_OTHER): Payer: BC Managed Care – PPO | Admitting: Allergy and Immunology

## 2018-08-30 VITALS — BP 120/88 | HR 66 | Temp 97.6°F | Resp 16 | Ht 66.0 in | Wt 202.8 lb

## 2018-08-30 DIAGNOSIS — J4540 Moderate persistent asthma, uncomplicated: Secondary | ICD-10-CM

## 2018-08-30 DIAGNOSIS — J3089 Other allergic rhinitis: Secondary | ICD-10-CM | POA: Diagnosis not present

## 2018-08-30 DIAGNOSIS — J301 Allergic rhinitis due to pollen: Secondary | ICD-10-CM

## 2018-08-30 DIAGNOSIS — T7800XD Anaphylactic reaction due to unspecified food, subsequent encounter: Secondary | ICD-10-CM

## 2018-08-30 DIAGNOSIS — K219 Gastro-esophageal reflux disease without esophagitis: Secondary | ICD-10-CM

## 2018-08-30 NOTE — Progress Notes (Signed)
Billy Morris - Billy Morris - Billy Morris   Follow-up Note  Referring Provider: Biagio Borg, MD Primary Provider: Biagio Borg, MD Date of Office Visit: 08/30/2018  Subjective:   Billy Morris (DOB: [birth year]) is a 48 y.o. male who returns to the [facility] on 08/30/2018 in re-evaluation of the following:  HPI: Billy Morris presents to this clinic in evaluation of asthma and allergic rhinoconjunctivitis and reflux and alpha gal syndrome.  His last visit to this clinic was 03 May 2018.  Other than the fact that he works in an environment of 94 degrees at a [employer] which sometimes causes problems with general health he has not really been having any significant problems with his airway and rarely uses a short acting bronchodilator while he continues on a collection of anti-inflammatory agents for his airway.  Currently he is using Qvar twice a day and using a nasal steroid on a consistent basis.  He has not required a systemic steroid or antibiotic since being seen in this clinic last.  He has had very little problems with reflux at this Morris in time.  He continues to utilize Prevacid twice a day.  He remains away from mammal consumption although occasionally does sneak pig consumption.  Allergies as of 08/30/2018      Reactions   Beef Extract Hives   All red meat causes hives   Ciprofloxacin Other (See Comments), Diarrhea   Bloody stool Bloody stool Stools have bloody mucous   Penicillins Rash   Sulfa Antibiotics Rash   Tiotropium Bromide Monohydrate Other (See Comments)   heart racing and paresthesias   Aspirin    Other reaction(s): Other (See Comments)   Nabumetone    Other reaction(s): Other (See Comments) weakness weakness   Sulfonamide Derivatives       Medication List    albuterol 108 (90 Base) MCG/ACT inhaler Commonly known as: ProAir HFA INHALE TWO PUFFS 4 TIMES DAILY AS NEEDED FOR SHORTNESS OF BREATH   beclomethasone 80  MCG/ACT inhaler Commonly known as: QVAR Inhale 2 puffs into the lungs 2 (two) times daily. What changed: Another medication with the same name was removed. Continue taking this medication, and follow the directions you see here. Changed by: Adilee Lemme Kevan Rosebush, MD   fluticasone 50 MCG/ACT nasal spray Commonly known as: FLONASE USE 2 SPRAY(S) IN EACH NOSTRIL ONCE DAILY   lansoprazole 30 MG capsule Commonly known as: PREVACID Take 1 capsule (30 mg total) by mouth 2 (two) times daily.   levocetirizine 5 MG tablet Commonly known as: XYZAL TAKE 1 TABLET BY MOUTH EVERY DAY IN THE EVENING   meloxicam 15 MG tablet Commonly known as: MOBIC TAKE ONE TABLET BY MOUTH ONCE DAILY AS NEEDED   nystatin powder Commonly known as: MYCOSTATIN/NYSTOP Use as directed twice per day to affected area as needed       Past Medical History:  Diagnosis Date  . Allergic rhinitis   . Anal fissure   . Anxiety   . Asthma   . COPD (chronic obstructive pulmonary disease) ([place])   . Diverticulosis   . GERD (gastroesophageal reflux disease)   . H. pylori infection   . Hearing loss    chronic right  . Hiatal hernia   . Hyperlipidemia   . LBP (low back pain)   . TMJ (dislocation of temporomandibular joint)     Past Surgical History:  Procedure Laterality Date  . STAPEDECTOMY    . VASECTOMY  Review of systems negative except as noted in HPI / PMHx or noted below:  Review of Systems  Constitutional: Negative.   HENT: Negative.   Eyes: Negative.   Respiratory: Negative.   Cardiovascular: Negative.   Gastrointestinal: Negative.   Genitourinary: Negative.   Musculoskeletal: Negative.   Skin: Negative.   Neurological: Negative.   Endo/Heme/Allergies: Negative.   Psychiatric/Behavioral: Negative.      Objective:   Vitals:   08/30/18 1044  BP: 120/88  Pulse: 66  Resp: 16  Temp: 97.6 F (36.4 C)  SpO2: 98%   Height: 5\' 6"  (167.6 cm)  Weight: 202 lb 12.8 oz (92 kg)   Physical Exam  Constitutional:      Appearance: He is not diaphoretic.  HENT:     Head: Normocephalic.     Right Ear: Tympanic membrane, ear canal and external ear normal.     Left Ear: Tympanic membrane, ear canal and external ear normal.     Nose: Nose normal. No mucosal edema or rhinorrhea.     Mouth/Throat:     Pharynx: Uvula midline. No oropharyngeal exudate.  Eyes:     Conjunctiva/sclera: Conjunctivae normal.  Neck:     Thyroid: No thyromegaly.     Trachea: Trachea normal. No tracheal tenderness or tracheal deviation.  Cardiovascular:     Rate and Rhythm: Normal rate and regular rhythm.     Heart sounds: Normal heart sounds, S1 normal and S2 normal. No murmur.  Pulmonary:     Effort: No respiratory distress.     Breath sounds: Normal breath sounds. No stridor. No wheezing or rales.  Lymphadenopathy:     Head:     Right side of head: No tonsillar adenopathy.     Left side of head: No tonsillar adenopathy.     Cervical: No cervical adenopathy.  Skin:    Findings: No erythema or rash.     Nails: There is no clubbing.   Neurological:     Mental Status: He is alert.     Diagnostics:    Spirometry was performed and demonstrated an FEV1 of 2.62 at 75 % of predicted.  The patient had an Asthma Control Test with the following results: ACT Total Score: 21.    Assessment and Plan:   1. Asthma, moderate persistent, well-controlled   2. Perennial allergic rhinitis   3. Seasonal allergic rhinitis due to pollen   4. Gastroesophageal reflux disease, esophagitis presence not specified   5. Anaphylactic shock due to food, subsequent encounter     1.  Continue to perform allergen avoidance measures  2.  Continue to treat and prevent inflammation:   A.  Flonase -2 sprays each nostril daily  B.  Qvar 80 Respiclick -2 inhalations 1-2 times per day  3.  Continue to treat and prevent reflux:   A.  Continue Prevacid 30 mg -1 tablet 2 times a day  B.  Continue to minimize caffeine and  chocolate consumption  4.  If needed:   A.  Pro-air HFA -2 inhalations every 4-6 hours  B.  Xyzal 5 mg -1 tablet daily  C.  Auvi-Q 0.3, Benadryl, MD/ER evaluation for allergic reaction  5.  "Action Plan" for flare up::   A.  Increase Qvar to 3 inhalations 3 times per day  B.  Albuterol HFA if needed.   6.  Return to clinic in 6 months or earlier if problem  7.  Obtain fall flu vaccine (and COVID vaccine)  Kaion is really doing  quite well on his plan of medications.  He has a very good understanding of his medications and the appropriate dosing of his medications and assuming he continues to do well on this plan of anti-inflammatory agents for his airway and therapy directed against reflux I will see him back in this clinic in 6 months or earlier if there is a problem.  Allena Katz, MD Allergy / Immunology [place]

## 2018-08-30 NOTE — Patient Instructions (Signed)
  1.  Continue to perform allergen avoidance measures  2.  Continue to treat and prevent inflammation:   A.  Flonase -2 sprays each nostril daily  B.  Qvar 80 Respiclick -2 inhalations 1-2 times per day  3.  Continue to treat and prevent reflux:   A.  Continue Prevacid 30 mg -1 tablet 2 times a day  B.  Continue to minimize caffeine and chocolate consumption  4.  If needed:   A.  Pro-air HFA -2 inhalations every 4-6 hours  B.  Xyzal 5 mg -1 tablet daily  C.  Auvi-Q 0.3, Benadryl, MD/ER evaluation for allergic reaction  5.  "Action Plan" for flare up::   A.  Increase Qvar to 3 inhalations 3 times per day  B.  Albuterol HFA if needed.   6.  Return to clinic in 6 months or earlier if problem  7.  Obtain fall flu vaccine (and COVID vaccine)

## 2018-08-31 ENCOUNTER — Encounter: Payer: Self-pay | Admitting: Allergy and Immunology

## 2018-09-15 ENCOUNTER — Other Ambulatory Visit: Payer: Self-pay | Admitting: Internal Medicine

## 2018-09-22 DIAGNOSIS — H5315 Visual distortions of shape and size: Secondary | ICD-10-CM | POA: Diagnosis not present

## 2018-09-22 DIAGNOSIS — H31001 Unspecified chorioretinal scars, right eye: Secondary | ICD-10-CM | POA: Diagnosis not present

## 2018-09-22 DIAGNOSIS — H43811 Vitreous degeneration, right eye: Secondary | ICD-10-CM | POA: Diagnosis not present

## 2018-09-22 DIAGNOSIS — H353111 Nonexudative age-related macular degeneration, right eye, early dry stage: Secondary | ICD-10-CM | POA: Diagnosis not present

## 2018-09-29 NOTE — Progress Notes (Addendum)
[facility] Note  09/30/2018     CHIEF COMPLAINT Patient presents for Retina Evaluation   HISTORY OF PRESENT ILLNESS: Billy Morris is a 48 y.o. male who presents to the clinic today for:   HPI    Retina Evaluation    In right eye.  This started 9 days ago.  Duration of 9 days.  Associated Symptoms Floaters.  Negative for Flashes, Blind Spot, Photophobia, Scalp Tenderness, Fever, Pain, Glare, Jaw Claudication, Weight Loss, Distortion, Redness, Trauma, Shoulder/Hip pain and Fatigue.  Context:  distance vision, mid-range vision and near vision.  Treatments tried include no treatments.  I, the attending physician,  performed the HPI with the patient and updated documentation appropriately.          Comments    Patient c/o spot in vision OD close to center but off to the left a little bit. Started about 9 days ago. Some slight improvement in spot since onset 9 days ago. History of laser retinopexy OD for retinal break/hole in 2015 at [facility]. Had floaters for some time OD prior to onset of symptoms of spot in vision. No flashes. History of steroid injection in back for pinched nerves. Concerned that steroids may have something to do with vision loss. Also hit in head with steel guard at work in September 2019. Did not affect vision at the time.        Last edited by Bernarda Caffey, MD on 09/30/2018  1:37 PM. (History)    pt states he was sent here by Dr. Kathrin Penner at [facility], pt states he has seen several drs there, pt states he used to see Dr. Ricki Miller who found a tear and did laser to fix it, pt states he has been on steroids for a ruptured disc in his back and was told that may be causing some fluid behind his eye, pt states on August 12th he noticed a nickle size piece of vision missing, pt states the spot may have been missing for awhile, but he just not noticed it, he states the spot is slightly above center in his left  vision, pt feels like the spot is still there, but is getting better since he first noticed it, pt states he is not longer taking steroids  Referring physician: Shon Hough, MD [place],  [place] [address]  HISTORICAL INFORMATION:   Selected notes from the MEDICAL RECORD NUMBER Referred by Dr. Shon Hough for concern of PED OD LEE: 08.13.20 Elder Negus) [20/20 OU] Ocular Hx-h/o CSR PMH-anxiety, asthma, COPD, HLD   CURRENT MEDICATIONS: No current outpatient medications on file. (Ophthalmic Drugs)   No current facility-administered medications for this visit.  (Ophthalmic Drugs)   Current Outpatient Medications (Other)  Medication Sig  . albuterol (PROAIR HFA) 108 (90 Base) MCG/ACT inhaler INHALE TWO PUFFS 4 TIMES DAILY AS NEEDED FOR SHORTNESS OF BREATH  . beclomethasone (QVAR) 80 MCG/ACT inhaler Inhale 2 puffs into the lungs 2 (two) times daily.  . fluticasone (FLONASE) 50 MCG/ACT nasal spray USE 2 SPRAY(S) IN EACH NOSTRIL ONCE DAILY  . lansoprazole (PREVACID) 30 MG capsule Take 1 capsule (30 mg total) by mouth 2 (two) times daily.  [place] Kitchen levocetirizine (XYZAL) 5 MG tablet TAKE 1 TABLET BY MOUTH EVERY DAY IN THE EVENING  . meloxicam (MOBIC) 15 MG tablet TAKE ONE TABLET BY MOUTH ONCE DAILY AS NEEDED  . nystatin (MYCOSTATIN/NYSTOP) powder Use as directed twice per day to affected area as needed  No current facility-administered medications for this visit.  (Other)      REVIEW OF SYSTEMS: ROS    Positive for: Eyes, Respiratory   Negative for: Constitutional, Gastrointestinal, Neurological, Skin, Genitourinary, Musculoskeletal, HENT, Endocrine, Cardiovascular, Psychiatric, Allergic/Imm, Heme/Lymph   Last edited by Roselee Nova D on 09/30/2018  8:29 AM. (History)       ALLERGIES Allergies  Allergen Reactions  . Beef Extract Hives    All red meat causes hives  . Ciprofloxacin Other (See Comments) and Diarrhea    Bloody stool Bloody stool Stools have  bloody mucous  . Penicillins Rash  . Sulfa Antibiotics Rash  . Tiotropium Bromide Monohydrate Other (See Comments)    heart racing and paresthesias  . Aspirin     Other reaction(s): Other (See Comments)  . Nabumetone     Other reaction(s): Other (See Comments) weakness weakness  . Sulfonamide Derivatives     PAST MEDICAL HISTORY Past Medical History:  Diagnosis Date  . Allergic rhinitis   . Anal fissure   . Anxiety   . Asthma   . COPD (chronic obstructive pulmonary disease) ([place])   . Diverticulosis   . GERD (gastroesophageal reflux disease)   . H. pylori infection   . Hearing loss    chronic right  . Hiatal hernia   . Hyperlipidemia   . LBP (low back pain)   . TMJ (dislocation of temporomandibular joint)    Past Surgical History:  Procedure Laterality Date  . laser retinopexy Right 2015  . STAPEDECTOMY    . VASECTOMY      FAMILY HISTORY Family History  Problem Relation Age of Onset  . Hyperlipidemia Father   . Other Mother        ibs  . Hyperlipidemia Mother   . Irritable bowel syndrome Maternal Aunt        x4  . COPD Maternal Grandfather   . Prostate cancer Maternal Grandfather   . Colon polyps Maternal Grandfather   . Allergies Other        children  . Asthma Son   . Heart failure Other        paternal great grandmother  . Heart attack Maternal Grandmother   . Diabetes Maternal Grandmother   . Stroke Maternal Aunt   . Diabetes Maternal Aunt   . Ovarian cancer Paternal Grandmother   . Diabetes Paternal Aunt     SOCIAL HISTORY Social History   Tobacco Use  . Smoking status: Never Smoker  . Smokeless tobacco: Never Used  Substance Use Topics  . Alcohol use: No  . Drug use: No         OPHTHALMIC EXAM:  Base Eye Exam    Visual Acuity (Snellen - Linear)      Right Left   Dist [place] 20/20 -1 20/20   Correction: Glasses       Tonometry (Tonopen, 8:40 AM)      Right Left   Pressure 12 11       Pupils      Dark Light Shape React APD    Right 4 3 Round Brisk None   Left 4 3 Round Brisk None       Visual Fields (Counting fingers)      Left Right    Full Full       Extraocular Movement      Right Left    Full, Ortho Full, Ortho       Neuro/Psych    Oriented x3: Yes  Mood/Affect: Normal       Dilation    Both eyes: 1.0% Mydriacyl, 2.5% Phenylephrine @ 8:40 AM        Slit Lamp and Fundus Exam    Slit Lamp Exam      Right Left   Lids/Lashes Dermatochalasis - upper lid, mild Meibomian gland dysfunction Dermatochalasis - upper lid, mild Meibomian gland dysfunction   Conjunctiva/Sclera White and quiet White and quiet   Cornea Clear Clear   Anterior Chamber Deep and quiet Deep and quiet   Iris Round and dilated Round and dilated   Lens 2+ Nuclear sclerosis, 2+ Cortical cataract 2+ Nuclear sclerosis, 2+ Cortical cataract   Vitreous Vitreous syneresis, Posterior vitreous detachment Vitreous syneresis, Posterior vitreous detachment       Fundus Exam      Right Left   Disc Pink and Sharp Pink and Sharp   C/D Ratio 0.3 0.2   Macula Flat, Blunted foveal reflex, focal RPE disruption with pigment clump inferior fovea, no edema Flat, Good foveal reflex, mild Retinal pigment epithelial mottling, No heme or edema   Vessels Mild Vascular attenuation, mild AV crossing changes Mild Vascular attenuation, mild AV crossing changes   Periphery Attached, laser scars 0900 periphery Attached, irregular hypopigmented lesion above ST arcades approx. 1.5DD        Refraction    Manifest Refraction      Sphere Cylinder Axis Dist VA   Right +0.25 +0.50 177 20/20   Left +0.75 +0.25 155 20/20          IMAGING AND PROCEDURES  Imaging and Procedures for @TODAY @  OCT, Retina - OU - Both Eyes       Right Eye Quality was good. Central Foveal Thickness: 332. Progression has no prior data. Findings include normal foveal contour, no IRF, no SRF, pigment epithelial detachment (Focal PED with ellipsoid disruption).   Left  Eye Quality was good. Central Foveal Thickness: 324. Progression has no prior data. Findings include no IRF, normal foveal contour, no SRF.   Notes *Images captured and stored on drive  Diagnosis / Impression:  NFP, no IRF/SRF OU Focal PED with ellipsoid disruption OD  Clinical management:  See below  Abbreviations: NFP - Normal foveal profile. CME - cystoid macular edema. PED - pigment epithelial detachment. IRF - intraretinal fluid. SRF - subretinal fluid. EZ - ellipsoid zone. ERM - epiretinal membrane. ORA - outer retinal atrophy. ORT - outer retinal tubulation. SRHM - subretinal hyper-reflective material                 ASSESSMENT/PLAN:    ICD-10-CM   1. Central serous chorioretinopathy of right eye  H35.711   2. Retinal edema  H35.81 OCT, Retina - OU - Both Eyes  3. Essential hypertension  I10   4. Hypertensive retinopathy of both eyes  H35.033   5. Combined forms of age-related cataract of both eyes  H25.813   6. History of repair of retinal defect by laser photocoagulation  Z98.890     1,2. Central Serous Chorioretinopathy OD  - by history, pt reports episodes of decreased vision correlated with steroid use  - most recent episode started 9 days ago and is improved today  - complains of "spot" in superior paracentral visual field  - exam shows focal RPE disruption with pigment clump inferior fovea, which corresponds with complaint  - OCT shows Focal PED with ellipsoid disruption inferior fovea -- no SRF -- but likely focal point of previous leakage / serous detachment  -  discussed findings, proposed mechanisms of disease, prognosis, and treatment options including observation, po eplerenone, and intravitreal anti-VEGF therapy  - pt wishes to observe for now -- reasonable  - f/u 4 weeks -- DFE/OCT/optos colors, FAF -- possible FA if OCT shows fluid  3,4. Hypertensive retinopathy OU  - discussed importance of tight BP control  - monitor  5. Mixed form age related  cataract OU  - The symptoms of cataract, surgical options, and treatments and risks were discussed with patient.  - discussed diagnosis and progression  - not yet visually significant  - monitor for now  6. History of Retinal Tear OD  - located at 0900 periphery  - laser procedure completed by Dr. Ricki Miller   Ophthalmic Meds Ordered this visit:  No orders of the defined types were placed in this encounter.      Return in about 4 weeks (around 10/28/2018) for f/u CSR OD, DFE, OCT.  There are no Patient Instructions on file for this visit.   Explained the diagnoses, plan, and follow up with the patient and they expressed understanding.  Patient expressed understanding of the importance of proper follow up care.   This document serves as a record of services personally performed by Gardiner Sleeper, MD, PhD. It was created on their behalf by Ernest Mallick, OA, an ophthalmic assistant. The creation of this record is the provider's dictation and/or activities during the visit.    Electronically signed by: Ernest Mallick, OA  08.20.2020 1:10 AM    Gardiner Sleeper, M.D., Ph.D. Diseases & Surgery of the Retina and Vitreous [facility]  I have reviewed the above documentation for accuracy and completeness, and I agree with the above. Gardiner Sleeper, M.D., Ph.D. 10/03/18 1:10 AM    Abbreviations: M myopia (nearsighted); A astigmatism; H hyperopia (farsighted); P presbyopia; Mrx spectacle prescription;  CTL contact lenses; OD right eye; OS left eye; OU both eyes  XT exotropia; ET esotropia; PEK punctate epithelial keratitis; PEE punctate epithelial erosions; DES dry eye syndrome; MGD meibomian gland dysfunction; ATs artificial tears; PFAT's preservative free artificial tears; [place] nuclear sclerotic cataract; PSC posterior subcapsular cataract; ERM epi-retinal membrane; PVD posterior vitreous detachment; RD retinal detachment; DM diabetes mellitus; DR diabetic  retinopathy; NPDR non-proliferative diabetic retinopathy; PDR proliferative diabetic retinopathy; CSME clinically significant macular edema; DME diabetic macular edema; dbh dot blot hemorrhages; CWS cotton wool spot; POAG primary open angle glaucoma; C/D cup-to-disc ratio; HVF humphrey visual field; GVF goldmann visual field; OCT optical coherence tomography; IOP intraocular pressure; BRVO Branch retinal vein occlusion; CRVO central retinal vein occlusion; CRAO central retinal artery occlusion; BRAO branch retinal artery occlusion; RT retinal tear; SB scleral buckle; PPV pars plana vitrectomy; VH Vitreous hemorrhage; PRP panretinal laser photocoagulation; IVK intravitreal kenalog; VMT vitreomacular traction; MH Macular hole;  NVD neovascularization of the disc; NVE neovascularization elsewhere; AREDS age related eye disease study; ARMD age related macular degeneration; POAG primary open angle glaucoma; EBMD epithelial/anterior basement membrane dystrophy; ACIOL anterior chamber intraocular lens; IOL intraocular lens; PCIOL posterior chamber intraocular lens; Phaco/IOL phacoemulsification with intraocular lens placement; [place] photorefractive keratectomy; LASIK laser assisted in situ keratomileusis; HTN hypertension; DM diabetes mellitus; COPD chronic obstructive pulmonary disease

## 2018-09-30 ENCOUNTER — Ambulatory Visit (INDEPENDENT_AMBULATORY_CARE_PROVIDER_SITE_OTHER): Payer: BC Managed Care – PPO | Admitting: Ophthalmology

## 2018-09-30 ENCOUNTER — Encounter (INDEPENDENT_AMBULATORY_CARE_PROVIDER_SITE_OTHER): Payer: Self-pay | Admitting: Ophthalmology

## 2018-09-30 ENCOUNTER — Other Ambulatory Visit: Payer: Self-pay

## 2018-09-30 DIAGNOSIS — H3581 Retinal edema: Secondary | ICD-10-CM | POA: Diagnosis not present

## 2018-09-30 DIAGNOSIS — I10 Essential (primary) hypertension: Secondary | ICD-10-CM

## 2018-09-30 DIAGNOSIS — H35711 Central serous chorioretinopathy, right eye: Secondary | ICD-10-CM

## 2018-09-30 DIAGNOSIS — Z9889 Other specified postprocedural states: Secondary | ICD-10-CM

## 2018-09-30 DIAGNOSIS — H35033 Hypertensive retinopathy, bilateral: Secondary | ICD-10-CM

## 2018-09-30 DIAGNOSIS — H25813 Combined forms of age-related cataract, bilateral: Secondary | ICD-10-CM

## 2018-10-28 ENCOUNTER — Encounter (INDEPENDENT_AMBULATORY_CARE_PROVIDER_SITE_OTHER): Payer: BC Managed Care – PPO | Admitting: Ophthalmology

## 2018-11-02 ENCOUNTER — Other Ambulatory Visit: Payer: Self-pay | Admitting: Allergy and Immunology

## 2018-11-02 ENCOUNTER — Other Ambulatory Visit: Payer: Self-pay

## 2018-11-02 MED ORDER — BECLOMETHASONE DIPROPIONATE 80 MCG/ACT IN AERS: INHALATION_SPRAY | RESPIRATORY_TRACT | 5 refills | Status: DC

## 2018-11-02 NOTE — Telephone Encounter (Signed)
For some reason the patients qvar 80 was denied.   Please send to [facility]

## 2018-11-02 NOTE — Telephone Encounter (Signed)
Med sent.

## 2018-11-25 ENCOUNTER — Encounter: Payer: Self-pay | Admitting: Internal Medicine

## 2018-11-30 ENCOUNTER — Encounter: Payer: Self-pay | Admitting: Allergy and Immunology

## 2018-11-30 ENCOUNTER — Telehealth: Payer: Self-pay

## 2018-11-30 NOTE — Telephone Encounter (Signed)
Patient will send a picture of new insurance card via mychart.

## 2018-11-30 NOTE — Telephone Encounter (Signed)
Try to initiate PA for Prevacid medication through [email or web address] Was unable to verify eligibility. We will need to see if coverage is UTD.

## 2018-12-01 NOTE — Telephone Encounter (Signed)
Message of pt insurance card has been routed to [employer]

## 2018-12-02 NOTE — Telephone Encounter (Signed)
PA initiated with new provided insurance card and sent through [email or web address] awaiting approval.

## 2018-12-05 NOTE — Telephone Encounter (Signed)
PA for prevacid was approved through [email or web address]

## 2018-12-15 ENCOUNTER — Other Ambulatory Visit: Payer: Self-pay | Admitting: Internal Medicine

## 2019-01-31 ENCOUNTER — Ambulatory Visit: Payer: BC Managed Care – PPO | Admitting: Allergy and Immunology

## 2019-03-02 ENCOUNTER — Other Ambulatory Visit: Payer: Self-pay | Admitting: Internal Medicine

## 2019-03-02 MED ORDER — MELOXICAM 15 MG PO TABS: ORAL_TABLET | ORAL | 0 refills | Status: DC

## 2019-03-02 NOTE — Telephone Encounter (Signed)
Requested medication (s) are due for refill today: yes  Requested medication (s) are on the active medication list: yes  Last refill:  03/13/16  Future visit scheduled:No  Notes to clinic: Rx expired    Requested Prescriptions  Pending Prescriptions Disp Refills   meloxicam (MOBIC) 15 MG tablet 30 tablet 8      There is no refill protocol information for this order

## 2019-03-02 NOTE — Telephone Encounter (Signed)
Medication Refill - Medication: meloxicam (MOBIC) 15 MG tablet  Patient is having a lot of pain and is completely out of medication  Preferred Pharmacy (with phone number or street name):  [id number] - [place], [place] [address] [place] [place] Phone:  [phone]  Fax:  [phone]       Agent: Please be advised that RX refills may take up to 3 business days. We ask that you follow-up with your pharmacy.

## 2019-03-07 ENCOUNTER — Ambulatory Visit: Payer: Managed Care, Other (non HMO) | Admitting: Allergy and Immunology

## 2019-03-07 ENCOUNTER — Encounter: Payer: Self-pay | Admitting: Allergy and Immunology

## 2019-03-07 ENCOUNTER — Other Ambulatory Visit: Payer: Self-pay

## 2019-03-07 VITALS — BP 128/78 | HR 78 | Temp 97.7°F | Resp 18

## 2019-03-07 DIAGNOSIS — J3089 Other allergic rhinitis: Secondary | ICD-10-CM | POA: Diagnosis not present

## 2019-03-07 DIAGNOSIS — T7800XD Anaphylactic reaction due to unspecified food, subsequent encounter: Secondary | ICD-10-CM

## 2019-03-07 DIAGNOSIS — J4540 Moderate persistent asthma, uncomplicated: Secondary | ICD-10-CM | POA: Diagnosis not present

## 2019-03-07 DIAGNOSIS — J301 Allergic rhinitis due to pollen: Secondary | ICD-10-CM | POA: Diagnosis not present

## 2019-03-07 DIAGNOSIS — K219 Gastro-esophageal reflux disease without esophagitis: Secondary | ICD-10-CM

## 2019-03-07 MED ORDER — LANSOPRAZOLE 30 MG PO CPDR: DELAYED_RELEASE_CAPSULE | ORAL | 5 refills | Status: DC

## 2019-03-07 MED ORDER — FLUTICASONE PROPIONATE 50 MCG/ACT NA SUSP: 2.0000 | Freq: Every day | NASAL | 5 refills | Status: DC

## 2019-03-07 MED ORDER — LEVOCETIRIZINE DIHYDROCHLORIDE 5 MG PO TABS: ORAL_TABLET | ORAL | 5 refills | Status: DC

## 2019-03-07 MED ORDER — BECLOMETHASONE DIPROPIONATE 80 MCG/ACT IN AERS: INHALATION_SPRAY | RESPIRATORY_TRACT | 5 refills | Status: DC

## 2019-03-07 MED ORDER — ALBUTEROL SULFATE HFA 108 (90 BASE) MCG/ACT IN AERS: INHALATION_SPRAY | RESPIRATORY_TRACT | 2 refills | Status: DC

## 2019-03-07 NOTE — Patient Instructions (Addendum)
  1.  Continue to perform allergen avoidance measures - mammal, dust mite, pollen, cat, dog, mold  2.  Continue to treat and prevent inflammation:   A.  Flonase -2 sprays each nostril daily  B.  Qvar 80 Respiclick -2 inhalations 1-2 times per day  3.  Continue to treat and prevent reflux:   A.  Continue Prevacid 30 mg -1 tablet 1-2 times a day  B.  Continue to minimize caffeine and chocolate consumption  4.  If needed:   A.  Pro-air HFA -2 inhalations every 4-6 hours  B.  Xyzal 5 mg -1 tablet daily  C.  Auvi-Q 0.3, Benadryl, MD/ER evaluation for allergic reaction  5.  "Action Plan" for flare up::   A.  Increase Qvar to 3 inhalations 3 times per day  B.  Albuterol HFA if needed.   6.  Return to clinic in 6 months or earlier if problem  7.  Obtain COVID vaccine

## 2019-03-07 NOTE — Progress Notes (Signed)
[facility] [place] [facility] [place]   Follow-up Note  Referring Provider: Biagio Borg, MD Primary Provider: Biagio Borg, MD Date of Office Visit: 03/07/2019  Subjective:   Billy Morris (DOB: June 03, 1970) is a 49 y.o. male who returns to the [facility] on 03/07/2019 in re-evaluation of the following:  HPI: Billy Morris returns to this clinic in reevaluation of asthma and allergic rhinitis and LPR and food allergy directed against mammal.  His last visit to this clinic was 30 August 2018.  He apparently had a little problem with chest tightness that was bronchodilator responsive towards the end of November that migrated into December but fortunately has resolved.  Otherwise, he has done very well with his asthma and rarely has any symptoms and rarely uses a short acting bronchodilator and can exert himself without any problem and has not required a systemic steroid to treat an exacerbation.  Likewise, he does very well with his nose as long as he continues to consistently use a nasal steroid.  His reflux is under good control although he has had a few episodes of reflux while eating chocolate and drinking caffeine during the holidays.  He continues to use Prevacid 1-2 times per day  He remains away from consumption of mammal meat.  He did receive the flu vaccine.  Allergies as of 03/07/2019      Reactions   Beef (bovine) Protein Hives   All red meat causes hives   Ciprofloxacin Other (See Comments), Diarrhea   Bloody stool Bloody stool Stools have bloody mucous   Penicillins Rash   Sulfa Antibiotics Rash   Tiotropium Bromide Monohydrate Other (See Comments)   heart racing and paresthesias   Aspirin    Other reaction(s): Other (See Comments)   Nabumetone    Other reaction(s): Other (See Comments) weakness weakness   Sulfonamide Derivatives       Medication List      albuterol 108 (90 Base) MCG/ACT inhaler Commonly known as: ProAir  HFA INHALE TWO PUFFS 4 TIMES DAILY AS NEEDED FOR SHORTNESS OF BREATH   beclomethasone 80 MCG/ACT inhaler Commonly known as: QVAR Use 2 puffs 1-2 times per day   fluticasone 50 MCG/ACT nasal spray Commonly known as: FLONASE USE 2 SPRAY(S) IN EACH NOSTRIL ONCE DAILY   lansoprazole 30 MG capsule Commonly known as: PREVACID Take 1 capsule (30 mg total) by mouth 2 (two) times daily.   levocetirizine 5 MG tablet Commonly known as: XYZAL TAKE 1 TABLET BY MOUTH EVERY DAY IN THE EVENING   meloxicam 15 MG tablet Commonly known as: MOBIC TAKE ONE TABLET BY MOUTH ONCE DAILY AS NEEDED   nystatin powder Commonly known as: MYCOSTATIN/NYSTOP Use as directed twice per day to affected area as needed       Past Medical History:  Diagnosis Date  . Allergic rhinitis   . Anal fissure   . Anxiety   . Asthma   . COPD (chronic obstructive pulmonary disease) ([place])   . Diverticulosis   . GERD (gastroesophageal reflux disease)   . H. pylori infection   . Hearing loss    chronic right  . Hiatal hernia   . Hyperlipidemia   . LBP (low back pain)   . TMJ (dislocation of temporomandibular joint)     Past Surgical History:  Procedure Laterality Date  . laser retinopexy Right 2015  . STAPEDECTOMY    . VASECTOMY      Review of systems negative  except as noted in HPI / PMHx or noted below:  Review of Systems  Constitutional: Negative.   HENT: Negative.   Eyes: Negative.   Respiratory: Negative.   Cardiovascular: Negative.   Gastrointestinal: Negative.   Genitourinary: Negative.   Musculoskeletal: Negative.   Skin: Negative.   Neurological: Negative.   Endo/Heme/Allergies: Negative.   Psychiatric/Behavioral: Negative.      Objective:   Vitals:   03/07/19 1002  BP: 128/78  Pulse: 78  Resp: 18  Temp: 97.7 F (36.5 C)          Physical Exam Constitutional:      Appearance: He is not diaphoretic.  HENT:     Head: Normocephalic.     Right Ear: Tympanic membrane, ear  canal and external ear normal.     Left Ear: Tympanic membrane, ear canal and external ear normal.     Nose: Nose normal. No mucosal edema or rhinorrhea.     Mouth/Throat:     Pharynx: Uvula midline. No oropharyngeal exudate.  Eyes:     Conjunctiva/sclera: Conjunctivae normal.  Neck:     Thyroid: No thyromegaly.     Trachea: Trachea normal. No tracheal tenderness or tracheal deviation.  Cardiovascular:     Rate and Rhythm: Normal rate and regular rhythm.     Heart sounds: Normal heart sounds, S1 normal and S2 normal. No murmur.  Pulmonary:     Effort: No respiratory distress.     Breath sounds: Normal breath sounds. No stridor. No wheezing or rales.  Lymphadenopathy:     Head:     Right side of head: No tonsillar adenopathy.     Left side of head: No tonsillar adenopathy.     Cervical: No cervical adenopathy.  Skin:    Findings: No erythema or rash.     Nails: There is no clubbing.  Neurological:     Mental Status: He is alert.     Diagnostics:    Spirometry was performed and demonstrated an FEV1 of 2.65 at 75 % of predicted.  The patient had an Asthma Control Test with the following results: ACT Total Score: 22.    Assessment and Plan:   1. Asthma, moderate persistent, well-controlled   2. Perennial allergic rhinitis   3. Seasonal allergic rhinitis due to pollen   4. Anaphylactic shock due to food, subsequent encounter   5. LPRD (laryngopharyngeal reflux disease)     1.  Continue to perform allergen avoidance measures - mammal, dust mite, pollen, cat, dog, mold  2.  Continue to treat and prevent inflammation:   A.  Flonase -2 sprays each nostril daily  B.  Qvar 80 Respiclick -2 inhalations 1-2 times per day  3.  Continue to treat and prevent reflux:   A.  Continue Prevacid 30 mg -1 tablet 1-2 times a day  B.  Continue to minimize caffeine and chocolate consumption  4.  If needed:   A.  Pro-air HFA -2 inhalations every 4-6 hours  B.  Xyzal 5 mg -1 tablet  daily  C.  Auvi-Q 0.3, Benadryl, MD/ER evaluation for allergic reaction  5.  "Action Plan" for flare up::   A.  Increase Qvar to 3 inhalations 3 times per day  B.  Albuterol HFA if needed.   6.  Return to clinic in 6 months or earlier if problem  7.  Obtain COVID vaccine  Overall Billy Morris is doing very well on his treatment plan.  He will continue to use anti-inflammatory agents for  his airway and therapy directed against reflux as noted above.  Assuming he continues to do this well I will see him back in this clinic in 6 months or earlier if there is a problem.  Allena Katz, MD Allergy / Immunology [place]

## 2019-03-08 ENCOUNTER — Encounter: Payer: Self-pay | Admitting: Allergy and Immunology

## 2019-04-14 ENCOUNTER — Ambulatory Visit: Payer: Managed Care, Other (non HMO) | Attending: Internal Medicine

## 2019-04-14 ENCOUNTER — Ambulatory Visit: Payer: Self-pay

## 2019-04-14 DIAGNOSIS — Z23 Encounter for immunization: Secondary | ICD-10-CM | POA: Insufficient documentation

## 2019-04-14 NOTE — Progress Notes (Signed)
   Covid-19 Vaccination Clinic  Name:  Billy Morris    MRN: [id number] DOB: 05-17-1970  04/14/2019  [name] was observed post Covid-19 immunization for 15 minutes without incident. He was provided with Vaccine Information Sheet and instruction to access the V-Safe system.   [name] was instructed to call 911 with any severe reactions post vaccine: Marland Kitchen Difficulty breathing  . Swelling of face and throat  . A fast heartbeat  . A bad rash all over body  . Dizziness and weakness   Immunizations Administered    Name Date Dose VIS Date Route   Pfizer COVID-19 Vaccine 04/14/2019  9:56 AM 0.3 mL 01/20/2019 Intramuscular   Manufacturer: [place]   Lot: [id number]   [place]: [id number]

## 2019-05-05 ENCOUNTER — Encounter: Payer: Self-pay | Admitting: Internal Medicine

## 2019-05-05 ENCOUNTER — Ambulatory Visit (INDEPENDENT_AMBULATORY_CARE_PROVIDER_SITE_OTHER): Payer: Managed Care, Other (non HMO) | Admitting: Internal Medicine

## 2019-05-05 ENCOUNTER — Other Ambulatory Visit: Payer: Self-pay

## 2019-05-05 VITALS — BP 154/80 | HR 70 | Temp 98.1°F | Ht 66.0 in | Wt 201.8 lb

## 2019-05-05 DIAGNOSIS — E559 Vitamin D deficiency, unspecified: Secondary | ICD-10-CM

## 2019-05-05 DIAGNOSIS — Z0000 Encounter for general adult medical examination without abnormal findings: Secondary | ICD-10-CM

## 2019-05-05 DIAGNOSIS — Z0001 Encounter for general adult medical examination with abnormal findings: Secondary | ICD-10-CM

## 2019-05-05 DIAGNOSIS — E785 Hyperlipidemia, unspecified: Secondary | ICD-10-CM

## 2019-05-05 DIAGNOSIS — R21 Rash and other nonspecific skin eruption: Secondary | ICD-10-CM | POA: Diagnosis not present

## 2019-05-05 DIAGNOSIS — R739 Hyperglycemia, unspecified: Secondary | ICD-10-CM | POA: Diagnosis not present

## 2019-05-05 DIAGNOSIS — J309 Allergic rhinitis, unspecified: Secondary | ICD-10-CM

## 2019-05-05 DIAGNOSIS — E611 Iron deficiency: Secondary | ICD-10-CM

## 2019-05-05 DIAGNOSIS — J45909 Unspecified asthma, uncomplicated: Secondary | ICD-10-CM

## 2019-05-05 DIAGNOSIS — E538 Deficiency of other specified B group vitamins: Secondary | ICD-10-CM

## 2019-05-05 DIAGNOSIS — F411 Generalized anxiety disorder: Secondary | ICD-10-CM

## 2019-05-05 DIAGNOSIS — R030 Elevated blood-pressure reading, without diagnosis of hypertension: Secondary | ICD-10-CM

## 2019-05-05 LAB — URINALYSIS, ROUTINE W REFLEX MICROSCOPIC
Bilirubin Urine: NEGATIVE
Hgb urine dipstick: NEGATIVE
Ketones, ur: NEGATIVE
Leukocytes,Ua: NEGATIVE
Nitrite: NEGATIVE
Specific Gravity, Urine: 1.025 (ref 1.000–1.030)
Total Protein, Urine: NEGATIVE
Urine Glucose: NEGATIVE
Urobilinogen, UA: 0.2 (ref 0.0–1.0)
pH: 6 (ref 5.0–8.0)

## 2019-05-05 LAB — HEMOGLOBIN A1C: Hgb A1c MFr Bld: 5.3 % (ref 4.6–6.5)

## 2019-05-05 LAB — LIPID PANEL
Cholesterol: 210 mg/dL — ABNORMAL HIGH (ref 0–200)
HDL: 54.4 mg/dL (ref >39.00)
LDL Cholesterol: 133 mg/dL — ABNORMAL HIGH (ref 0–99)
NonHDL: 155.71
Total CHOL/HDL Ratio: 4
Triglycerides: 114 mg/dL (ref 0.0–149.0)
VLDL: 22.8 mg/dL (ref 0.0–40.0)

## 2019-05-05 LAB — CBC WITH DIFFERENTIAL/PLATELET
Basophils Absolute: 0.1 10*3/uL (ref 0.0–0.1)
Basophils Relative: 0.9 % (ref 0.0–3.0)
Eosinophils Absolute: 0.1 10*3/uL (ref 0.0–0.7)
Eosinophils Relative: 1.5 % (ref 0.0–5.0)
HCT: 43.4 % (ref 39.0–52.0)
Hemoglobin: 15.1 g/dL (ref 13.0–17.0)
Lymphocytes Relative: 31.8 % (ref 12.0–46.0)
Lymphs Abs: 2.2 10*3/uL (ref 0.7–4.0)
MCHC: 34.8 g/dL (ref 30.0–36.0)
MCV: 91.9 fl (ref 78.0–100.0)
Monocytes Absolute: 0.6 10*3/uL (ref 0.1–1.0)
Monocytes Relative: 8.4 % (ref 3.0–12.0)
Neutro Abs: 4 10*3/uL (ref 1.4–7.7)
Neutrophils Relative %: 57.4 % (ref 43.0–77.0)
Platelets: 266 10*3/uL (ref 150.0–400.0)
RBC: 4.73 Mil/uL (ref 4.22–5.81)
RDW: 12.5 % (ref 11.5–15.5)
WBC: 6.9 10*3/uL (ref 4.0–10.5)

## 2019-05-05 LAB — HEPATIC FUNCTION PANEL
ALT: 42 U/L (ref 0–53)
AST: 20 U/L (ref 0–37)
Albumin: 4.5 g/dL (ref 3.5–5.2)
Alkaline Phosphatase: 92 U/L (ref 39–117)
Bilirubin, Direct: 0.1 mg/dL (ref 0.0–0.3)
Total Bilirubin: 0.8 mg/dL (ref 0.2–1.2)
Total Protein: 7 g/dL (ref 6.0–8.3)

## 2019-05-05 LAB — VITAMIN B12: Vitamin B-12: 147 pg/mL — ABNORMAL LOW (ref 211–911)

## 2019-05-05 LAB — IBC PANEL
Iron: 110 ug/dL (ref 42–165)
Saturation Ratios: 23.7 % (ref 20.0–50.0)
Transferrin: 332 mg/dL (ref 212.0–360.0)

## 2019-05-05 LAB — BASIC METABOLIC PANEL
BUN: 12 mg/dL (ref 6–23)
CO2: 27 mEq/L (ref 19–32)
Calcium: 9.6 mg/dL (ref 8.4–10.5)
Chloride: 105 mEq/L (ref 96–112)
Creatinine, Ser: 0.81 mg/dL (ref 0.40–1.50)
GFR: 101.38 mL/min (ref >60.00)
Glucose, Bld: 96 mg/dL (ref 70–99)
Potassium: 3.8 mEq/L (ref 3.5–5.1)
Sodium: 138 mEq/L (ref 135–145)

## 2019-05-05 LAB — VITAMIN D 25 HYDROXY (VIT D DEFICIENCY, FRACTURES): VITD: 18.87 ng/mL — ABNORMAL LOW (ref 30.00–100.00)

## 2019-05-05 LAB — PSA: PSA: 0.36 ng/mL (ref 0.10–4.00)

## 2019-05-05 LAB — TSH: TSH: 1.48 u[IU]/mL (ref 0.35–4.50)

## 2019-05-05 MED ORDER — KETOCONAZOLE 2 % EX CREA: 1.0000 "application " | TOPICAL_CREAM | Freq: Every day | CUTANEOUS | 1 refills | Status: DC

## 2019-05-05 NOTE — Assessment & Plan Note (Signed)
stable overall by history and exam, recent data reviewed with pt, and pt to continue medical treatment as before,  to f/u any worsening symptoms or concerns  

## 2019-05-05 NOTE — Assessment & Plan Note (Signed)

## 2019-05-05 NOTE — Progress Notes (Signed)
Subjective:    Patient ID: Billy Morris, male    DOB: 1970-09-18, 49 y.o.   MRN: [id number]  HPI  Here for wellness and f/u;  Overall doing ok;  Pt denies Chest pain, worsening SOB, DOE, wheezing, orthopnea, PND, worsening LE edema, palpitations, dizziness or syncope.  Pt denies neurological change such as new headache, facial or extremity weakness.  Pt denies polydipsia, polyuria, or low sugar symptoms. Pt states overall good compliance with treatment and medications, good tolerability, and has been trying to follow appropriate diet. . No fever, night sweats, wt loss, loss of appetite, or other constitutional symptoms.  Pt states good ability with ADL's, has low fall risk, home safety reviewed and adequate, no other significant changes in hearing or vision, and only occasionally active with exercise.  BP < 140/90 at home.  Also c/o spots to skin left flank better with a cream previously for ringworm, asking for refill.  ALso every time he comes off his antihistamine he starts to itch all over, wondering if this is some sort of withdrawal rxn or something else serious.  Denies worsening depressive symptoms, suicidal ideation, or panic; has ongoing anxiety, not increased recently but remains stable moderately symptomatic, has always declined SSRI or other tx in the past similar to day Past Medical History:  Diagnosis Date  . Allergic rhinitis   . Anal fissure   . Anxiety   . Asthma   . COPD (chronic obstructive pulmonary disease) ([place])   . Diverticulosis   . GERD (gastroesophageal reflux disease)   . H. pylori infection   . Hearing loss    chronic right  . Hiatal hernia   . Hyperlipidemia   . LBP (low back pain)   . TMJ (dislocation of temporomandibular joint)    Past Surgical History:  Procedure Laterality Date  . laser retinopexy Right 2015  . STAPEDECTOMY    . VASECTOMY      reports that he has never smoked. He has never used smokeless tobacco. He reports that he does not drink alcohol  or use drugs. family history includes Allergies in an other family member; Asthma in his son; COPD in his maternal grandfather; Colon polyps in his maternal grandfather; Diabetes in his maternal aunt, maternal grandmother, and paternal aunt; Heart attack in his maternal grandmother; Heart failure in an other family member; Hyperlipidemia in his father and mother; Irritable bowel syndrome in his maternal aunt; Other in his mother; Ovarian cancer in his paternal grandmother; Prostate cancer in his maternal grandfather; Stroke in his maternal aunt. Allergies  Allergen Reactions  . Beef (Bovine) Protein Hives    All red meat causes hives  . Ciprofloxacin Other (See Comments) and Diarrhea    Bloody stool Bloody stool Stools have bloody mucous  . Penicillins Rash  . Sulfa Antibiotics Rash  . Tiotropium Bromide Monohydrate Other (See Comments)    heart racing and paresthesias  . Aspirin     Other reaction(s): Other (See Comments)  . Nabumetone     Other reaction(s): Other (See Comments) weakness weakness  . Sulfonamide Derivatives    Current Outpatient Medications on File Prior to Visit  Medication Sig Dispense Refill  . albuterol (PROAIR HFA) 108 (90 Base) MCG/ACT inhaler INHALE TWO PUFFS 4 TIMES DAILY AS NEEDED FOR SHORTNESS OF BREATH 18 g 2  . beclomethasone (QVAR) 80 MCG/ACT inhaler Use 2 puffs 1-2 times per day 1 Inhaler 5  . fluticasone (FLONASE) 50 MCG/ACT nasal spray Place 2 sprays into both  nostrils daily. 16 g 5  . lansoprazole (PREVACID) 30 MG capsule Take 1 capsule 1-2 times daily as needed. 60 capsule 5  . levocetirizine (XYZAL) 5 MG tablet TAKE 1 TABLET BY MOUTH EVERY DAY IN THE EVENING AS NEEDED. 30 tablet 5  . meloxicam (MOBIC) 15 MG tablet TAKE ONE TABLET BY MOUTH ONCE DAILY AS NEEDED 30 tablet 0  . nystatin (MYCOSTATIN/NYSTOP) powder Use as directed twice per day to affected area as needed 30 g 2   No current facility-administered medications on file prior to visit.    Review of Systems All otherwise neg per pt     Objective:   Physical Exam BP (!) 154/80   Pulse 70   Temp 98.1 F (36.7 C)   Ht 5\' 6"  (1.676 m)   Wt 201 lb 12.8 oz (91.5 kg)   SpO2 99%   BMI 32.57 kg/m  VS noted,  Constitutional: Pt appears in NAD HENT: Head: NCAT.  Right Ear: External ear normal.  Left Ear: External ear normal.  Eyes: . Pupils are equal, round, and reactive to light. Conjunctivae and EOM are normal Nose: without d/c or deformity Neck: Neck supple. Gross normal ROM Cardiovascular: Normal rate and regular rhythm.   Pulmonary/Chest: Effort normal and breath sounds without rales or wheezing.  Abd:  Soft, NT, ND, + BS, no organomegaly Neurological: Pt is alert. At baseline orientation, motor grossly intact Skin: Skin is warm. + ringworm rash to left flank,, other new lesions, no LE edema Psychiatric: Pt behavior is normal without agitation, 2+ nervous  All otherwise neg per pt Lab Results  Component Value Date   WBC 6.9 05/05/2019   HGB 15.1 05/05/2019   HCT 43.4 05/05/2019   PLT 266.0 05/05/2019   GLUCOSE 96 05/05/2019   CHOL 210 (H) 05/05/2019   TRIG 114.0 05/05/2019   HDL 54.40 05/05/2019   LDLCALC 133 (H) 05/05/2019   ALT 42 05/05/2019   AST 20 05/05/2019   NA 138 05/05/2019   K 3.8 05/05/2019   CL 105 05/05/2019   CREATININE 0.81 05/05/2019   BUN 12 05/05/2019   CO2 27 05/05/2019   TSH 1.48 05/05/2019   PSA 0.36 05/05/2019   HGBA1C 5.3 05/05/2019      Assessment & Plan:

## 2019-05-05 NOTE — Assessment & Plan Note (Signed)
Declines specific tx or counseling

## 2019-05-05 NOTE — Assessment & Plan Note (Signed)
D/w pt, itching off the antihistamine is characteristic of allergy disorder recuring when antihistamine is held; ok to conintue the antihistamine daily

## 2019-05-05 NOTE — Patient Instructions (Signed)
Please take all new medication as prescribed - the ketoconozole cream for the rash to the left back  Please continue all other medications as before, and refills have been done if requested.  Please have the pharmacy call with any other refills you may need.  Please continue your efforts at being more active, low cholesterol diet, and weight control.  You are otherwise up to date with prevention measures today.  Please keep your appointments with your specialists as you may have planned  Please go to the LAB at the blood drawing area for the tests to be done - first floor  You will be contacted by phone if any changes need to be made immediately.  Otherwise, you will receive a letter about your results with an explanation, but please check with MyChart first.  Please remember to sign up for MyChart if you have not done so, as this will be important to you in the future with finding out test results, communicating by private email, and scheduling acute appointments online when needed.  Please make an Appointment to return for your 1 year visit, or sooner if needed

## 2019-05-05 NOTE — Assessment & Plan Note (Addendum)
C/w fungal - for ketocon cr prn asd  I spent 32 minutes in preparing to see the patient by review of recent labs, imaging and procedures, obtaining and reviewing separately obtained history, communicating with the patient and family or caregiver, ordering medications, tests or procedures, and documenting clinical information in the EHR including the differential Dx, treatment, and any further evaluation and other management of rash, HLD, asthma, anxiety, allergies, elev BP

## 2019-05-05 NOTE — Assessment & Plan Note (Signed)
Per pt BP at home < 140/90, continue to monitor

## 2019-05-06 ENCOUNTER — Other Ambulatory Visit: Payer: Self-pay | Admitting: Internal Medicine

## 2019-05-06 MED ORDER — VITAMIN D (ERGOCALCIFEROL) 1.25 MG (50000 UNIT) PO CAPS: 50000.0000 [IU] | ORAL_CAPSULE | ORAL | 0 refills | Status: DC

## 2019-05-06 MED ORDER — VITAMIN B-12 1000 MCG PO TABS: 1000.0000 ug | ORAL_TABLET | Freq: Every day | ORAL | 3 refills | Status: AC

## 2019-05-16 ENCOUNTER — Ambulatory Visit: Payer: Managed Care, Other (non HMO) | Attending: Internal Medicine

## 2019-05-16 DIAGNOSIS — Z23 Encounter for immunization: Secondary | ICD-10-CM

## 2019-05-16 NOTE — Progress Notes (Signed)
   Covid-19 Vaccination Clinic  Name:  Billy Morris    MRN: [id number] DOB: 18-Nov-1970  05/16/2019  [name] was observed post Covid-19 immunization for 15 minutes without incident. He was provided with Vaccine Information Sheet and instruction to access the V-Safe system.   [name] was instructed to call 911 with any severe reactions post vaccine: Marland Kitchen Difficulty breathing  . Swelling of face and throat  . A fast heartbeat  . A bad rash all over body  . Dizziness and weakness   Immunizations Administered    Name Date Dose VIS Date Route   Pfizer COVID-19 Vaccine 05/16/2019  4:57 PM 0.3 mL 01/20/2019 Intramuscular   Manufacturer: [place]   Lot: [id number]   [place]: [id number]

## 2019-05-25 ENCOUNTER — Encounter: Payer: Self-pay | Admitting: Internal Medicine

## 2019-07-23 ENCOUNTER — Other Ambulatory Visit: Payer: Self-pay | Admitting: Internal Medicine

## 2019-07-23 NOTE — Telephone Encounter (Signed)
Please let pt know to change to OTC Vitamin D3 at 2000 units per day, indefinitely.  

## 2019-08-16 ENCOUNTER — Encounter: Payer: Self-pay | Admitting: Internal Medicine

## 2019-08-16 ENCOUNTER — Ambulatory Visit: Payer: Managed Care, Other (non HMO) | Admitting: Internal Medicine

## 2019-08-16 ENCOUNTER — Other Ambulatory Visit: Payer: Self-pay

## 2019-08-16 ENCOUNTER — Ambulatory Visit (INDEPENDENT_AMBULATORY_CARE_PROVIDER_SITE_OTHER): Payer: Managed Care, Other (non HMO)

## 2019-08-16 VITALS — BP 118/80 | HR 66 | Temp 98.6°F | Ht 66.0 in | Wt 198.0 lb

## 2019-08-16 DIAGNOSIS — J45909 Unspecified asthma, uncomplicated: Secondary | ICD-10-CM

## 2019-08-16 DIAGNOSIS — R079 Chest pain, unspecified: Secondary | ICD-10-CM | POA: Diagnosis not present

## 2019-08-16 DIAGNOSIS — K219 Gastro-esophageal reflux disease without esophagitis: Secondary | ICD-10-CM

## 2019-08-16 DIAGNOSIS — J309 Allergic rhinitis, unspecified: Secondary | ICD-10-CM

## 2019-08-16 DIAGNOSIS — F411 Generalized anxiety disorder: Secondary | ICD-10-CM

## 2019-08-16 DIAGNOSIS — E785 Hyperlipidemia, unspecified: Secondary | ICD-10-CM

## 2019-08-16 MED ORDER — CLONAZEPAM 0.5 MG PO TABS
0.5000 mg | ORAL_TABLET | Freq: Two times a day (BID) | ORAL | 1 refills | Status: DC | PRN
Start: 2019-08-16 — End: 2020-05-10

## 2019-08-16 MED ORDER — CITALOPRAM HYDROBROMIDE 20 MG PO TABS: 20.0000 mg | ORAL_TABLET | Freq: Every day | ORAL | 3 refills | Status: DC
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
## 2019-08-16 NOTE — Patient Instructions (Addendum)
Your EKG was ok today  Please take all new medication as prescribed - the daily celexa 20 mg per day, and klonopin twice per day as needed only for stress  Please continue all other medications as before, and refills have been done if requested.  Please have the pharmacy call with any other refills you may need.  Please continue your efforts at being more active, low cholesterol diet, and weight control.  Please keep your appointments with your specialists as you may have planned  We have discussed the Cardiac CT Score test to measure the calcification level (if any) in your heart arteries.  This test has been ordered in our [facility], so please call [facility] directly, as they prefer this, at [phone] to be scheduled. \ Please go to the XRAY Department in the first floor for the x-ray testing  You will be contacted by phone if any changes need to be made immediately.  Otherwise, you will receive a letter about your results with an explanation, but please check with MyChart first.  Please remember to sign up for MyChart if you have not done so, as this will be important to you in the future with finding out test results, communicating by private email, and scheduling acute appointments online when needed.

## 2019-08-16 NOTE — Assessment & Plan Note (Signed)
stable overall by history and exam, recent data reviewed with pt, and pt to continue medical treatment as before,  to f/u any worsening symptoms or concerns  

## 2019-08-16 NOTE — Assessment & Plan Note (Signed)
Pt with chronic anxiety, now willing ofr celexa 20 qd, and klonopin bid prn, declines counseling referral

## 2019-08-16 NOTE — Addendum Note (Signed)
Addended by: Marijean Heath R on: 08/16/2019 09:24 AM   Modules accepted: Orders

## 2019-08-16 NOTE — Assessment & Plan Note (Addendum)
Atypical, likely stress induced but unclear, not overtely asthma or reflux or msk, but cant r/o cardiac, ecg reviewed, also for cxr, delcines stress test due to cost and deductible, ok for ct cardiac score  I spent 31 minutes in preparing to see the patient by review of recent labs, imaging and procedures, obtaining and reviewing separately obtained history, communicating with the patient and family or caregiver, ordering medications, tests or procedures, and documenting clinical information in the EHR including the differential Dx, treatment, and any further evaluation and other management of cp, anxity, allergies, asthma, hld

## 2019-08-16 NOTE — Assessment & Plan Note (Signed)
Lab Results  Component Value Date   LDLCALC 133 (H) 05/05/2019  to cont low chol diet and current meds, will need intensive tx if ct card score > 100

## 2019-08-22 ENCOUNTER — Other Ambulatory Visit: Payer: Self-pay

## 2019-08-22 ENCOUNTER — Ambulatory Visit: Payer: Managed Care, Other (non HMO) | Admitting: Allergy and Immunology

## 2019-08-22 ENCOUNTER — Encounter: Payer: Self-pay | Admitting: Allergy and Immunology

## 2019-08-22 VITALS — BP 112/70 | HR 73 | Temp 98.1°F | Resp 16 | Wt 205.8 lb

## 2019-08-22 DIAGNOSIS — J4540 Moderate persistent asthma, uncomplicated: Secondary | ICD-10-CM

## 2019-08-22 DIAGNOSIS — K219 Gastro-esophageal reflux disease without esophagitis: Secondary | ICD-10-CM

## 2019-08-22 DIAGNOSIS — T7800XD Anaphylactic reaction due to unspecified food, subsequent encounter: Secondary | ICD-10-CM

## 2019-08-22 DIAGNOSIS — J301 Allergic rhinitis due to pollen: Secondary | ICD-10-CM | POA: Diagnosis not present

## 2019-08-22 DIAGNOSIS — J3089 Other allergic rhinitis: Secondary | ICD-10-CM | POA: Diagnosis not present

## 2019-08-22 NOTE — Patient Instructions (Addendum)
°  1.  Continue to perform allergen avoidance measures - beef, dust mite, pollen, cat, dog, mold  2.  Continue to treat and prevent inflammation:   A.  Flonase - 1-2 sprays each nostril daily  B.  Qvar 80 -2 inhalations 1-2 times per day  3.  Continue to treat and prevent reflux:   A.  Continue Prevacid 30 mg -1 tablet 1-2 times a day  B.  Continue to minimize caffeine and chocolate consumption  4.  If needed:   A.  Pro-air HFA -2 inhalations every 4-6 hours  B.  Xyzal 5 mg -1 tablet daily  C.  Auvi-Q 0.3, Benadryl, MD/ER evaluation for allergic reaction  5.  "Action Plan" for flare up::   A.  Increase Qvar to 3 inhalations 3 times per day  B.  Albuterol HFA if needed.   6.  Return to clinic in 6 months or earlier if problem  7.  Obtain fall flu vaccine

## 2019-08-22 NOTE — Progress Notes (Signed)
[place] [facility] [place] [facility] [place]   Follow-up Note  Referring Provider: Biagio Borg, MD Primary Provider: Biagio Borg, MD Date of Office Visit: 08/22/2019  Subjective:   Billy Morris (DOB: [birth year]) is a 49 y.o. male who returns to the [facility] on 08/22/2019 in re-evaluation of the following:  HPI: Billy Morris returns to this clinic in reevaluation of asthma and allergic rhinitis and LPR and food allergy directed against mammal.  His last visit to this clinic was 07 March 2019.  Overall he has really done very well with his airway.  He rarely uses a short acting bronchodilator and has not required a systemic steroid or antibiotic for any type of airway problem.  Because of his work schedule he has not been able to exercise very often.  He varies the dose of Qvar and Flonase depending on disease activity.  This spring he did have a little bit more problem with some intermittent pain and pressure affecting his left side of his face but fortunately that issue appears to have completely abated.  As well, he had an episode of sternal chest pain and he is scheduled to have a coronary calcification CT scan performed.  It should be noted that his sternal chest pain came in the context of eating chocolate cake on multiple days in a row.  He still continues to drink about 200 mg of caffeine per week.  He believes that his reflux is under control with Prevacid twice a day.  He remains away from all beef consumption and deer consumption but he can eat pork without any problem.  He is now using some antianxiety medications in the form of clonazepam and it was recommended that he start on citalopram but has not done so to date.  He did receive 2 Pfizer Covid vaccinations.  Allergies as of 08/22/2019      Reactions   Beef (bovine) Protein Hives   All red meat causes hives   Ciprofloxacin Other (See Comments), Diarrhea   Bloody stool Bloody stool Stools  have bloody mucous   Penicillins Rash   Sulfa Antibiotics Rash   Tiotropium Bromide Monohydrate Other (See Comments)   heart racing and paresthesias   Aspirin    Other reaction(s): Other (See Comments)   Nabumetone    Other reaction(s): Other (See Comments) weakness weakness   Sulfonamide Derivatives       Medication List      albuterol 108 (90 Base) MCG/ACT inhaler Commonly known as: ProAir HFA INHALE TWO PUFFS 4 TIMES DAILY AS NEEDED FOR SHORTNESS OF BREATH   beclomethasone 80 MCG/ACT inhaler Commonly known as: QVAR Use 2 puffs 1-2 times per day   citalopram 20 MG tablet Commonly known as: CELEXA Take 1 tablet (20 mg total) by mouth daily.   clonazePAM 0.5 MG tablet Commonly known as: KLONOPIN Take 1 tablet (0.5 mg total) by mouth 2 (two) times daily as needed for anxiety.   fluticasone 50 MCG/ACT nasal spray Commonly known as: FLONASE Place 2 sprays into both nostrils daily.   ketoconazole 2 % cream Commonly known as: NIZORAL Apply 1 application topically daily.   lansoprazole 30 MG capsule Commonly known as: PREVACID Take 1 capsule 1-2 times daily as needed.   levocetirizine 5 MG tablet Commonly known as: XYZAL TAKE 1 TABLET BY MOUTH EVERY DAY IN THE EVENING AS NEEDED.   meloxicam 15 MG tablet Commonly known as: MOBIC TAKE ONE TABLET BY MOUTH ONCE  DAILY AS NEEDED   nystatin powder Commonly known as: MYCOSTATIN/NYSTOP Use as directed twice per day to affected area as needed   Qvar RediHaler 80 MCG/ACT inhaler Generic drug: beclomethasone SMARTSIG:1-2 Puff(s) Via Inhaler Twice Daily   vitamin B-12 1000 MCG tablet Commonly known as: CYANOCOBALAMIN Take 1 tablet (1,000 mcg total) by mouth daily.   Vitamin D (Ergocalciferol) 1.25 MG (50000 UNIT) Caps capsule Commonly known as: DRISDOL Take 1 capsule (50,000 Units total) by mouth every 7 (seven) days.       Past Medical History:  Diagnosis Date  . Allergic rhinitis   . Anal fissure   . Anxiety    . Asthma   . COPD (chronic obstructive pulmonary disease) ([place])   . Diverticulosis   . GERD (gastroesophageal reflux disease)   . H. pylori infection   . Hearing loss    chronic right  . Hiatal hernia   . Hyperlipidemia   . LBP (low back pain)   . TMJ (dislocation of temporomandibular joint)     Past Surgical History:  Procedure Laterality Date  . laser retinopexy Right 2015  . STAPEDECTOMY    . VASECTOMY      Review of systems negative except as noted in HPI / PMHx or noted below:  Review of Systems  Constitutional: Negative.   HENT: Negative.   Eyes: Negative.   Respiratory: Negative.   Cardiovascular: Negative.   Gastrointestinal: Negative.   Genitourinary: Negative.   Musculoskeletal: Negative.   Skin: Negative.   Neurological: Negative.   Endo/Heme/Allergies: Negative.   Psychiatric/Behavioral: Negative.      Objective:   Vitals:   08/22/19 1050  BP: 112/70  Pulse: 73  Resp: 16  Temp: 98.1 F (36.7 C)  SpO2: 98%      Weight: 205 lb 12.8 oz (93.4 kg)   Physical Exam Constitutional:      Appearance: He is not diaphoretic.  HENT:     Head: Normocephalic.     Right Ear: Tympanic membrane, ear canal and external ear normal.     Left Ear: Tympanic membrane, ear canal and external ear normal.     Nose: Nose normal. No mucosal edema or rhinorrhea.     Mouth/Throat:     Pharynx: Uvula midline. No oropharyngeal exudate.  Eyes:     Conjunctiva/sclera: Conjunctivae normal.  Neck:     Thyroid: No thyromegaly.     Trachea: Trachea normal. No tracheal tenderness or tracheal deviation.  Cardiovascular:     Rate and Rhythm: Normal rate and regular rhythm.     Heart sounds: Normal heart sounds, S1 normal and S2 normal. No murmur heard.   Pulmonary:     Effort: No respiratory distress.     Breath sounds: Normal breath sounds. No stridor. No wheezing or rales.  Lymphadenopathy:     Head:     Right side of head: No tonsillar adenopathy.     Left side of  head: No tonsillar adenopathy.     Cervical: No cervical adenopathy.  Skin:    Findings: No erythema or rash.     Nails: There is no clubbing.  Neurological:     Mental Status: He is alert.     Diagnostics:    Spirometry was performed and demonstrated an FEV1 of 2.67 at 77 % of predicted.  Assessment and Plan:   1. Asthma, moderate persistent, well-controlled   2. Perennial allergic rhinitis   3. Seasonal allergic rhinitis due to pollen   4. Anaphylactic shock due  to food, subsequent encounter   5. LPRD (laryngopharyngeal reflux disease)     1.  Continue to perform allergen avoidance measures - beef, dust mite, pollen, cat, dog, mold  2.  Continue to treat and prevent inflammation:   A.  Flonase - 1-2 sprays each nostril daily  B.  Qvar 80 -2 inhalations 1-2 times per day  3.  Continue to treat and prevent reflux:   A.  Continue Prevacid 30 mg -1 tablet 1-2 times a day  B.  Continue to minimize caffeine and chocolate consumption  4.  If needed:   A.  Pro-air HFA -2 inhalations every 4-6 hours  B.  Xyzal 5 mg -1 tablet daily  C.  Auvi-Q 0.3, Benadryl, MD/ER evaluation for allergic reaction  5.  "Action Plan" for flare up::   A.  Increase Qvar to 3 inhalations 3 times per day  B.  Albuterol HFA if needed.   6.  Return to clinic in 6 months or earlier if problem  7.  Obtain fall flu vaccine  Overall Yotam is doing relatively well on his current plan which includes attention to allergen avoidance measures and a dose of nasal steroid and inhaled steroid and therapy directed against reflux.  I am not going to change his therapy at this point in time.  He is a very good understanding of his disease state and appropriate dosing of his medications depending on the activity of his disease state.  I will see him back in this clinic in 6 months or earlier if there is a problem.  Allena Katz, MD [facility] / [facility] [place]

## 2019-08-23 ENCOUNTER — Encounter: Payer: Self-pay | Admitting: Allergy and Immunology

## 2019-08-24 ENCOUNTER — Encounter: Payer: Self-pay | Admitting: Internal Medicine

## 2019-08-25 ENCOUNTER — Encounter: Payer: Self-pay | Admitting: Internal Medicine

## 2019-08-25 DIAGNOSIS — E538 Deficiency of other specified B group vitamins: Secondary | ICD-10-CM

## 2019-09-01 ENCOUNTER — Other Ambulatory Visit: Payer: Self-pay

## 2019-09-01 ENCOUNTER — Encounter: Payer: Self-pay | Admitting: Internal Medicine

## 2019-09-01 ENCOUNTER — Ambulatory Visit (INDEPENDENT_AMBULATORY_CARE_PROVIDER_SITE_OTHER)
Admission: RE | Admit: 2019-09-01 | Discharge: 2019-09-01 | Disposition: A | Discharge status: Home / Self Care | Payer: Self-pay | Source: Ambulatory Visit | Attending: Internal Medicine | Admitting: Internal Medicine

## 2019-09-01 DIAGNOSIS — I2510 Atherosclerotic heart disease of native coronary artery without angina pectoris: Secondary | ICD-10-CM

## 2019-09-01 DIAGNOSIS — E785 Hyperlipidemia, unspecified: Secondary | ICD-10-CM

## 2019-09-01 DIAGNOSIS — R079 Chest pain, unspecified: Secondary | ICD-10-CM

## 2019-09-01 HISTORY — DX: Atherosclerotic heart disease of native coronary artery without angina pectoris: I25.10

## 2019-09-10 ENCOUNTER — Other Ambulatory Visit: Payer: Self-pay | Admitting: Allergy and Immunology

## 2019-09-16 ENCOUNTER — Encounter: Payer: Self-pay | Admitting: Internal Medicine

## 2019-09-16 ENCOUNTER — Encounter: Payer: Self-pay | Admitting: Allergy and Immunology

## 2019-09-26 ENCOUNTER — Encounter: Payer: Self-pay | Admitting: Internal Medicine

## 2019-09-27 NOTE — Telephone Encounter (Signed)
Staff to contact pt - for OV next available any provider

## 2019-09-27 NOTE — Telephone Encounter (Signed)
New message:   LVM for pt to call the office to schedule an appt with any provider.

## 2019-09-28 ENCOUNTER — Encounter: Payer: Self-pay | Admitting: Internal Medicine

## 2019-09-28 ENCOUNTER — Other Ambulatory Visit: Payer: Self-pay

## 2019-09-28 ENCOUNTER — Ambulatory Visit: Payer: Managed Care, Other (non HMO) | Admitting: Internal Medicine

## 2019-09-28 ENCOUNTER — Other Ambulatory Visit (INDEPENDENT_AMBULATORY_CARE_PROVIDER_SITE_OTHER): Payer: Managed Care, Other (non HMO)

## 2019-09-28 VITALS — BP 118/78 | HR 64 | Temp 98.4°F | Ht 66.0 in | Wt 195.0 lb

## 2019-09-28 DIAGNOSIS — F411 Generalized anxiety disorder: Secondary | ICD-10-CM | POA: Diagnosis not present

## 2019-09-28 DIAGNOSIS — K219 Gastro-esophageal reflux disease without esophagitis: Secondary | ICD-10-CM | POA: Diagnosis not present

## 2019-09-28 DIAGNOSIS — K921 Melena: Secondary | ICD-10-CM

## 2019-09-28 LAB — CBC WITH DIFFERENTIAL/PLATELET
Basophils Absolute: 0 10*3/uL (ref 0.0–0.1)
Basophils Relative: 0.9 % (ref 0.0–3.0)
Eosinophils Absolute: 0.1 10*3/uL (ref 0.0–0.7)
Eosinophils Relative: 1.9 % (ref 0.0–5.0)
HCT: 40.5 % (ref 39.0–52.0)
Hemoglobin: 13.9 g/dL (ref 13.0–17.0)
Lymphocytes Relative: 42.1 % (ref 12.0–46.0)
Lymphs Abs: 2.4 10*3/uL (ref 0.7–4.0)
MCHC: 34.3 g/dL (ref 30.0–36.0)
MCV: 92.1 fl (ref 78.0–100.0)
Monocytes Absolute: 0.6 10*3/uL (ref 0.1–1.0)
Monocytes Relative: 10 % (ref 3.0–12.0)
Neutro Abs: 2.6 10*3/uL (ref 1.4–7.7)
Neutrophils Relative %: 45.1 % (ref 43.0–77.0)
Platelets: 227 10*3/uL (ref 150.0–400.0)
RBC: 4.4 Mil/uL (ref 4.22–5.81)
RDW: 12.4 % (ref 11.5–15.5)
WBC: 5.7 10*3/uL (ref 4.0–10.5)

## 2019-09-28 MED ORDER — CITALOPRAM HYDROBROMIDE 20 MG PO TABS: 20.0000 mg | ORAL_TABLET | Freq: Every day | ORAL | 3 refills | Status: DC

## 2019-09-28 NOTE — Patient Instructions (Signed)
Please continue all other medications as before, and refills have been done if requested.  Please have the pharmacy call with any other refills you may need.  Please continue your efforts at being more active, low cholesterol diet, and weight control  Please keep your appointments with your specialists as you may have planned  You will be contacted regarding the referral for: Dr Henrene Pastor asap  Please go to the LAB at the blood drawing area for the tests to be done  You will be contacted by phone if any changes need to be made immediately.  Otherwise, you will receive a letter about your results with an explanation, but please check with MyChart first.  Please remember to sign up for MyChart if you have not done so, as this will be important to you in the future with finding out test results, communicating by private email, and scheduling acute appointments online when needed.

## 2019-09-28 NOTE — Progress Notes (Signed)
Subjective:    Patient ID: Billy Morris, male    DOB: 02-15-1970, 49 y.o.   MRN: [id number]  HPI  Pt here with non stop history verbage as his I usual presentation, where I have to forcefully interrupt to get pertinent hx information, and ongoing anxiety; pt states small BRB with pink bowel water on aug 17, no pain except for LLQ "prick" sensation attributed to a but bite.  No fever, n/v and no further blood last 2 days.  Denies weakness, dizzy.  Does also have ongoing dysphagia and plans to d/w GI as well as is worsening recenlty to solids.  Also has delayed ejaculation with SSRI but willing to continue taking this.  Pt denies chest pain, increased sob or doe, wheezing, orthopnea, PND, increased LE swelling, palpitations, dizziness or syncope.   Pt denies polydipsia, polyuria. Past Medical History:  Diagnosis Date  . Allergic rhinitis   . Anal fissure   . Anxiety   . Asthma   . COPD (chronic obstructive pulmonary disease) ([place])   . Diverticulosis   . GERD (gastroesophageal reflux disease)   . H. pylori infection   . Hearing loss    chronic right  . Hiatal hernia   . Hyperlipidemia   . LBP (low back pain)   . TMJ (dislocation of temporomandibular joint)    Past Surgical History:  Procedure Laterality Date  . laser retinopexy Right 2015  . STAPEDECTOMY    . VASECTOMY      reports that he has never smoked. He has never used smokeless tobacco. He reports that he does not drink alcohol and does not use drugs. family history includes Allergies in an other family member; Asthma in his son; COPD in his maternal grandfather; Colon polyps in his maternal grandfather; Diabetes in his maternal aunt, maternal grandmother, and paternal aunt; Heart attack in his maternal grandmother; Heart failure in an other family member; Hyperlipidemia in his father and mother; Irritable bowel syndrome in his maternal aunt; Other in his mother; Ovarian cancer in his paternal grandmother; Prostate cancer in his  maternal grandfather; Stroke in his maternal aunt. Allergies  Allergen Reactions  . Beef (Bovine) Protein Hives    All red meat causes hives  . Ciprofloxacin Other (See Comments) and Diarrhea    Bloody stool Bloody stool Stools have bloody mucous  . Penicillins Rash  . Sulfa Antibiotics Rash  . Tiotropium Bromide Monohydrate Other (See Comments)    heart racing and paresthesias  . Aspirin     Other reaction(s): Other (See Comments)  . Nabumetone     Other reaction(s): Other (See Comments) weakness weakness  . Sulfonamide Derivatives    Current Outpatient Medications on File Prior to Visit  Medication Sig Dispense Refill  . albuterol (PROAIR HFA) 108 (90 Base) MCG/ACT inhaler INHALE TWO PUFFS 4 TIMES DAILY AS NEEDED FOR SHORTNESS OF BREATH 18 g 2  . beclomethasone (QVAR) 80 MCG/ACT inhaler Use 2 puffs 1-2 times per day 1 Inhaler 5  . clonazePAM (KLONOPIN) 0.5 MG tablet Take 1 tablet (0.5 mg total) by mouth 2 (two) times daily as needed for anxiety. 60 tablet 1  . fluticasone (FLONASE) 50 MCG/ACT nasal spray Place 2 sprays into both nostrils daily. 16 g 5  . ketoconazole (NIZORAL) 2 % cream Apply 1 application topically daily. 30 g 1  . lansoprazole (PREVACID) 30 MG capsule TAKE 1 CAPSULE 1-2 TIMES DAILY AS NEEDED. 180 capsule 1  . levocetirizine (XYZAL) 5 MG tablet TAKE 1 TABLET  BY MOUTH EVERY DAY IN THE EVENING AS NEEDED. 30 tablet 5  . meloxicam (MOBIC) 15 MG tablet TAKE ONE TABLET BY MOUTH ONCE DAILY AS NEEDED 30 tablet 0  . nystatin (MYCOSTATIN/NYSTOP) powder Use as directed twice per day to affected area as needed 30 g 2  . QVAR REDIHALER 80 MCG/ACT inhaler SMARTSIG:1-2 Puff(s) Via Inhaler Twice Daily    . vitamin B-12 (CYANOCOBALAMIN) 1000 MCG tablet Take 1 tablet (1,000 mcg total) by mouth daily. 90 tablet 3   No current facility-administered medications on file prior to visit.   Review of Systems All otherwise neg per pt    Objective:   Physical Exam BP 118/78 (BP  Location: Left Arm, Patient Position: Sitting, Cuff Size: Large)   Pulse 64   Temp 98.4 F (36.9 C) (Oral)   Ht 5\' 6"  (1.676 m)   Wt 195 lb (88.5 kg)   SpO2 99%   BMI 31.47 kg/m  VS noted,  Constitutional: Pt appears in NAD HENT: Head: NCAT.  Right Ear: External ear normal.  Left Ear: External ear normal.  Eyes: . Pupils are equal, round, and reactive to light. Conjunctivae and EOM are normal Nose: without d/c or deformity Neck: Neck supple. Gross normal ROM Cardiovascular: Normal rate and regular rhythm.   Pulmonary/Chest: Effort normal and breath sounds without rales or wheezing.  Abd:  Soft, NT, ND, + BS, no organomegaly Neurological: Pt is alert. At baseline orientation, motor grossly intact Skin: Skin is warm. No rashes, other new lesions, no LE edema Psychiatric: Pt behavior is normal without agitation  All otherwise neg per pt Lab Results  Component Value Date   WBC 5.7 09/28/2019   HGB 13.9 09/28/2019   HCT 40.5 09/28/2019   PLT 227.0 09/28/2019   GLUCOSE 96 05/05/2019   CHOL 210 (H) 05/05/2019   TRIG 114.0 05/05/2019   HDL 54.40 05/05/2019   LDLCALC 133 (H) 05/05/2019   ALT 42 05/05/2019   AST 20 05/05/2019   NA 138 05/05/2019   K 3.8 05/05/2019   CL 105 05/05/2019   CREATININE 0.81 05/05/2019   BUN 12 05/05/2019   CO2 27 05/05/2019   TSH 1.48 05/05/2019   PSA 0.36 05/05/2019   HGBA1C 5.3 05/05/2019      Assessment & Plan:

## 2019-09-29 ENCOUNTER — Telehealth: Payer: Self-pay | Admitting: Internal Medicine

## 2019-09-29 NOTE — Telephone Encounter (Signed)
I called the pt and offered an appt with Ellouise Newer on 8/25 at 1130 am and he accepted.  He will call back if his symptoms worsen in the meantime.

## 2019-09-29 NOTE — Telephone Encounter (Signed)
Hi Linda, we received this urgent referral from pt's PCP for blood in stool. I just spoke with opt and he stated that he passed blood with bm yesterday. He would like to be seen sooner than October. Thank you.

## 2019-09-30 ENCOUNTER — Encounter: Payer: Self-pay | Admitting: Internal Medicine

## 2019-09-30 NOTE — Assessment & Plan Note (Signed)
Chronic, cont ssri,  to f/u any worsening symptoms or concerns

## 2019-09-30 NOTE — Assessment & Plan Note (Addendum)
Small volume, for cbc, refer gI  I spent 31 minutes in preparing to see the patient by review of recent labs, imaging and procedures, obtaining and reviewing separately obtained history, communicating with the patient and family or caregiver, ordering medications, tests or procedures, and documenting clinical information in the EHR including the differential Dx, treatment, and any further evaluation and other management of hematochezia, gerd, anxiety

## 2019-09-30 NOTE — Assessment & Plan Note (Addendum)
?   mild worsening recently, refer GI o/w stable overall by history and exam, recent data reviewed with pt, and pt to continue medical treatment as before,  to f/u any worsening symptoms or concerns

## 2019-10-04 ENCOUNTER — Encounter: Payer: Self-pay | Admitting: Physician Assistant

## 2019-10-04 ENCOUNTER — Ambulatory Visit (INDEPENDENT_AMBULATORY_CARE_PROVIDER_SITE_OTHER): Payer: Managed Care, Other (non HMO) | Admitting: Physician Assistant

## 2019-10-04 VITALS — BP 112/68 | HR 74 | Ht 66.0 in | Wt 193.6 lb

## 2019-10-04 DIAGNOSIS — K625 Hemorrhage of anus and rectum: Secondary | ICD-10-CM

## 2019-10-04 DIAGNOSIS — K219 Gastro-esophageal reflux disease without esophagitis: Secondary | ICD-10-CM

## 2019-10-04 MED ORDER — SUPREP BOWEL PREP KIT 17.5-3.13-1.6 GM/177ML PO SOLN
1.0000 | ORAL | 0 refills | Status: DC
Start: 2019-10-04 — End: 2019-11-09

## 2019-10-04 NOTE — Progress Notes (Signed)
Assessment and plan reviewed 

## 2019-10-04 NOTE — Progress Notes (Signed)
Chief Complaint: Blood in stool  HPI:    Billy Morris is a 49 year old male with a past medical history as listed below including COPD, GERD and others, known to Dr. Henrene Pastor, who was referred to me by Biagio Borg, MD for a complaint of blood in stool.      12/17/2009 EGD was normal.  Nonulcer dyspepsia.  Colonoscopy on the same day for abdominal pain with normal terminal ileum, diverticula and normal colon.    09/28/2019 patient seen in clinic by his PCP for hematochezia.  At that time described a small amount of bright red blood with pink bowel water on August 17, no pain except for left lower quadrant "prick" sensation attributed to a bug bite.  Also ongoing dysphagia worsening to solids.    09/28/2019 CBC was normal.    Today, patient tells me that about 9 days ago he had a feeling like he was going to have diarrhea and rushed to the bathroom and had liquid stool with some pink-tinged water and a very small amount of bright red blood.  He has not seen any since then and his bowel movements have gone back to normal.  Patient tells that he chronically has some bright red blood on the toilet paper after wiping off and on which he blames on anal fissures which were diagnosed years ago by "Dr. Henrene Pastor".  Tells me that this recent bleeding was a little bit more than he is used to which is why he became surprised.  Tells me he is no longer worried about this bleeding really but tells me it is time almost for him to have his colonoscopy and he would like to get this done.    Also describes a long history of reflux which is mostly controlled on Prevacid 30 mg twice daily.  Patient tells me he is somewhat worried that he has disease underlying as he does have some breakthrough symptoms.    Denies fever, chills, weight loss or abdominal pain.  Past Medical History:  Diagnosis Date  . Allergic rhinitis   . Anal fissure   . Anxiety   . Asthma   . COPD (chronic obstructive pulmonary disease) ([place])   .  Diverticulosis   . GERD (gastroesophageal reflux disease)   . H. pylori infection   . Hearing loss    chronic right  . Hiatal hernia   . Hyperlipidemia   . LBP (low back pain)   . TMJ (dislocation of temporomandibular joint)     Past Surgical History:  Procedure Laterality Date  . laser retinopexy Right 2015  . STAPEDECTOMY    . VASECTOMY      Current Outpatient Medications  Medication Sig Dispense Refill  . albuterol (PROAIR HFA) 108 (90 Base) MCG/ACT inhaler INHALE TWO PUFFS 4 TIMES DAILY AS NEEDED FOR SHORTNESS OF BREATH 18 g 2  . beclomethasone (QVAR) 80 MCG/ACT inhaler Use 2 puffs 1-2 times per day 1 Inhaler 5  . citalopram (CELEXA) 20 MG tablet Take 1 tablet (20 mg total) by mouth daily. 90 tablet 3  . clonazePAM (KLONOPIN) 0.5 MG tablet Take 1 tablet (0.5 mg total) by mouth 2 (two) times daily as needed for anxiety. 60 tablet 1  . fluticasone (FLONASE) 50 MCG/ACT nasal spray Place 2 sprays into both nostrils daily. 16 g 5  . ketoconazole (NIZORAL) 2 % cream Apply 1 application topically daily. 30 g 1  . lansoprazole (PREVACID) 30 MG capsule TAKE 1 CAPSULE 1-2 TIMES DAILY AS NEEDED.  180 capsule 1  . levocetirizine (XYZAL) 5 MG tablet TAKE 1 TABLET BY MOUTH EVERY DAY IN THE EVENING AS NEEDED. 30 tablet 5  . meloxicam (MOBIC) 15 MG tablet TAKE ONE TABLET BY MOUTH ONCE DAILY AS NEEDED 30 tablet 0  . nystatin (MYCOSTATIN/NYSTOP) powder Use as directed twice per day to affected area as needed 30 g 2  . QVAR REDIHALER 80 MCG/ACT inhaler SMARTSIG:1-2 Puff(s) Via Inhaler Twice Daily    . vitamin B-12 (CYANOCOBALAMIN) 1000 MCG tablet Take 1 tablet (1,000 mcg total) by mouth daily. 90 tablet 3   No current facility-administered medications for this visit.    Allergies as of 10/04/2019 - Review Complete 09/30/2019  Allergen Reaction Noted  . Beef (bovine) protein Hives 07/06/2012  . Ciprofloxacin Other (See Comments) and Diarrhea 10/06/2006  . Penicillins Rash 07/06/2012  . Sulfa  antibiotics Rash 10/06/2006  . Tiotropium bromide monohydrate Other (See Comments) 04/11/2010  . Aspirin  10/06/2006  . Nabumetone  10/03/2013  . Sulfonamide derivatives  10/06/2006    Family History  Problem Relation Age of Onset  . Hyperlipidemia Father   . Other Mother        ibs  . Hyperlipidemia Mother   . Irritable bowel syndrome Maternal Aunt        x4  . COPD Maternal Grandfather   . Prostate cancer Maternal Grandfather   . Colon polyps Maternal Grandfather   . Allergies Other        children  . Asthma Son   . Heart failure Other        paternal great grandmother  . Heart attack Maternal Grandmother   . Diabetes Maternal Grandmother   . Stroke Maternal Aunt   . Diabetes Maternal Aunt   . Ovarian cancer Paternal Grandmother   . Diabetes Paternal Aunt   . Allergic rhinitis Neg Hx   . Angioedema Neg Hx   . Atopy Neg Hx   . Eczema Neg Hx   . Immunodeficiency Neg Hx   . Urticaria Neg Hx     Social History   Socioeconomic History  . Marital status: Married    Spouse name: Not on file  . Number of children: 2  . Years of education: Not on file  . Highest education level: Not on file  Occupational History  . Occupation: Financial controller  Tobacco Use  . Smoking status: Never Smoker  . Smokeless tobacco: Never Used  Vaping Use  . Vaping Use: Never used  Substance and Sexual Activity  . Alcohol use: No  . Drug use: No  . Sexual activity: Not on file  Other Topics Concern  . Not on file  Social History Narrative  . Not on file   Social Determinants of Health   Financial Resource Strain:   . Difficulty of Paying Living Expenses: Not on file  Food Insecurity:   . Worried About Charity fundraiser in the Last Year: Not on file  . Ran Out of Food in the Last Year: Not on file  Transportation Needs:   . Lack of Transportation (Medical): Not on file  . Lack of Transportation (Non-Medical): Not on file  Physical Activity:   . Days of Exercise per  Week: Not on file  . Minutes of Exercise per Session: Not on file  Stress:   . Feeling of Stress : Not on file  Social Connections:   . Frequency of Communication with Friends and Family: Not on file  . Frequency of  Social Gatherings with Friends and Family: Not on file  . Attends Religious Services: Not on file  . Active Member of Clubs or Organizations: Not on file  . Attends Archivist Meetings: Not on file  . Marital Status: Not on file  Intimate Partner Violence:   . Fear of Current or Ex-Partner: Not on file  . Emotionally Abused: Not on file  . Physically Abused: Not on file  . Sexually Abused: Not on file    Review of Systems:    Constitutional: No weight loss, fever or chills Skin: No rash  Cardiovascular: No chest pain Respiratory: No SOB  Gastrointestinal: See HPI and otherwise negative Genitourinary: No dysuria  Neurological: No headache Musculoskeletal: No new muscle or joint pain Hematologic: No bruising Psychiatric: No history of depression or anxiety   Physical Exam:  Vital signs: BP 112/68   Pulse 74   Ht 5\' 6"  (1.676 m)   Wt 193 lb 9.6 oz (87.8 kg)   SpO2 98%   BMI 31.25 kg/m   Constitutional:   Pleasant Caucasian male appears to be in NAD, Well developed, Well nourished, alert and cooperative Head:  Normocephalic and atraumatic. Eyes:   PEERL, EOMI. No icterus. Conjunctiva pink. Ears:  Normal auditory acuity. Neck:  Supple Throat: Oral cavity and pharynx without inflammation, swelling or lesion.  Respiratory: Respirations even and unlabored. Lungs clear to auscultation bilaterally.   No wheezes, crackles, or rhonchi.  Cardiovascular: Normal S1, S2. No MRG. Regular rate and rhythm. No peripheral edema, cyanosis or pallor.  Gastrointestinal:  Soft, nondistended, nontender. No rebound or guarding. Normal bowel sounds. No appreciable masses or hepatomegaly. Rectal:  declined Msk:  Symmetrical without gross deformities. Without edema, no  deformity or joint abnormality.  Neurologic:  Alert and  oriented x4;  grossly normal neurologically.  Skin:   Dry and intact without significant lesions or rashes. Psychiatric:  Demonstrates good judgement and reason without abnormal affect or behaviors.  RELEVANT LABS AND IMAGING: CBC    Component Value Date/Time   WBC 5.7 09/28/2019 1648   RBC 4.40 09/28/2019 1648   HGB 13.9 09/28/2019 1648   HCT 40.5 09/28/2019 1648   PLT 227.0 09/28/2019 1648   MCV 92.1 09/28/2019 1648   MCHC 34.3 09/28/2019 1648   RDW 12.4 09/28/2019 1648   LYMPHSABS 2.4 09/28/2019 1648   MONOABS 0.6 09/28/2019 1648   EOSABS 0.1 09/28/2019 1648   BASOSABS 0.0 09/28/2019 1648    CMP     Component Value Date/Time   NA 138 05/05/2019 1615   K 3.8 05/05/2019 1615   CL 105 05/05/2019 1615   CO2 27 05/05/2019 1615   GLUCOSE 96 05/05/2019 1615   BUN 12 05/05/2019 1615   CREATININE 0.81 05/05/2019 1615   CALCIUM 9.6 05/05/2019 1615   PROT 7.0 05/05/2019 1615   ALBUMIN 4.5 05/05/2019 1615   AST 20 05/05/2019 1615   ALT 42 05/05/2019 1615   ALKPHOS 92 05/05/2019 1615   BILITOT 0.8 05/05/2019 1615   GFRNONAA 109.48 10/21/2009 1043   GFRAA 140 03/22/2008 0844    Assessment: 1.  Rectal bleeding: 1 episode of a fair amount of rectal bleeding, more than "normal", apparently has rectal bleeding on a regular basis which he describes as fissure related ; most likely fissure/hemorrhoids versus other  2.  GERD: Reflux symptoms moderately controlled on Prevacid 30 mg twice daily, patient has been on this medicine for the past 9 years and is worried about underlying disease  Plan: 1.  Scheduled patient for diagnostic EGD and colonoscopy in the [place] with Dr. Henrene Pastor.  Did discuss risks, benefits, limitations and alternatives and the patient agrees to proceed.  He has had his Covid vaccines. 2.  Continue Prevacid 30 mg twice daily, 30-60 minutes before breakfast and dinner.  Patient tells me he does not need a refill of  this medicine. 3.  Patient to follow in clinic per recommendations from Dr. Henrene Pastor after time of procedures.  Ellouise Newer, PA-C [facility] 10/04/2019, 11:21 AM  Cc: Biagio Borg, MD

## 2019-10-04 NOTE — Patient Instructions (Signed)
If you are age 49 or older, your body mass index should be between 23-30. Your Body mass index is 31.25 kg/m. If this is out of the aforementioned range listed, please consider follow up with your Primary Care Provider.  If you are age 45 or younger, your body mass index should be between 19-25. Your Body mass index is 31.25 kg/m. If this is out of the aformentioned range listed, please consider follow up with your Primary Care Provider.   You have been scheduled for an endoscopy and colonoscopy. Please follow the written instructions given to you at your visit today. Please pick up your prep supplies at the pharmacy within the next 1-3 days. If you use inhalers (even only as needed), please bring them with you on the day of your procedure.   We have sent the following medications to your pharmacy for you to pick up at your convenience: Suprep   Due to recent changes in healthcare laws, you may see the results of your imaging and laboratory studies on MyChart before your provider has had a chance to review them.  We understand that in some cases there may be results that are confusing or concerning to you. Not all laboratory results come back in the same time frame and the provider may be waiting for multiple results in order to interpret others.  Please give [place] 48 hours in order for your provider to thoroughly review all the results before contacting the office for clarification of your results.   Thank you for choosing me and [facility].  Dennison Bulla

## 2019-11-01 ENCOUNTER — Encounter: Payer: Self-pay | Admitting: Internal Medicine

## 2019-11-09 ENCOUNTER — Encounter: Payer: Self-pay | Admitting: Internal Medicine

## 2019-11-09 ENCOUNTER — Other Ambulatory Visit: Payer: Self-pay

## 2019-11-09 ENCOUNTER — Ambulatory Visit (AMBULATORY_SURGERY_CENTER): Payer: Managed Care, Other (non HMO) | Admitting: Internal Medicine

## 2019-11-09 VITALS — BP 117/51 | HR 69 | Temp 97.8°F | Resp 12

## 2019-11-09 DIAGNOSIS — K625 Hemorrhage of anus and rectum: Secondary | ICD-10-CM

## 2019-11-09 DIAGNOSIS — D122 Benign neoplasm of ascending colon: Secondary | ICD-10-CM

## 2019-11-09 DIAGNOSIS — Z1211 Encounter for screening for malignant neoplasm of colon: Secondary | ICD-10-CM

## 2019-11-09 DIAGNOSIS — K635 Polyp of colon: Secondary | ICD-10-CM | POA: Diagnosis not present

## 2019-11-09 DIAGNOSIS — K219 Gastro-esophageal reflux disease without esophagitis: Secondary | ICD-10-CM

## 2019-11-09 MED ORDER — SODIUM CHLORIDE 0.9 % IV SOLN: 500.0000 mL | Freq: Once | INTRAVENOUS | Status: DC

## 2019-11-09 NOTE — Progress Notes (Signed)
PT taken to PACU. Monitors in place. VSS. Report given to RN. 

## 2019-11-09 NOTE — Patient Instructions (Signed)
Handouts provided on polyps, diverticulosis and hemorrhoids.   YOU HAD AN ENDOSCOPIC PROCEDURE TODAY AT THE [facility]:   Refer to the procedure report that was given to you for any specific questions about what was found during the examination.  If the procedure report does not answer your questions, please call your gastroenterologist to clarify.  If you requested that your care partner not be given the details of your procedure findings, then the procedure report has been included in a sealed envelope for you to review at your convenience later.  YOU SHOULD EXPECT: Some feelings of bloating in the abdomen. Passage of more gas than usual.  Walking can help get rid of the air that was put into your GI tract during the procedure and reduce the bloating. If you had a lower endoscopy (such as a colonoscopy or flexible sigmoidoscopy) you may notice spotting of blood in your stool or on the toilet paper. If you underwent a bowel prep for your procedure, you may not have a normal bowel movement for a few days.  Please Note:  You might notice some irritation and congestion in your nose or some drainage.  This is from the oxygen used during your procedure.  There is no need for concern and it should clear up in a day or so.  SYMPTOMS TO REPORT IMMEDIATELY:  Following lower endoscopy (colonoscopy or flexible sigmoidoscopy):  Excessive amounts of blood in the stool  Significant tenderness or worsening of abdominal pains  Swelling of the abdomen that is new, acute  Fever of 100F or higher  Following upper endoscopy (EGD)  Vomiting of blood or coffee ground material  New chest pain or pain under the shoulder blades  Painful or persistently difficult swallowing  New shortness of breath  Fever of 100F or higher  Black, tarry-looking stools  For urgent or emergent issues, a gastroenterologist can be reached at any hour by calling [phone]. Do not use MyChart messaging for urgent  concerns.    DIET:  We do recommend a small meal at first, but then you may proceed to your regular diet.  Drink plenty of fluids but you should avoid alcoholic beverages for 24 hours.  ACTIVITY:  You should plan to take it easy for the rest of today and you should NOT DRIVE or use heavy machinery until tomorrow (because of the sedation medicines used during the test).    FOLLOW UP: Our staff will call the number listed on your records 48-72 hours following your procedure to check on you and address any questions or concerns that you may have regarding the information given to you following your procedure. If we do not reach you, we will leave a message.  We will attempt to reach you two times.  During this call, we will ask if you have developed any symptoms of COVID 19. If you develop any symptoms (ie: fever, flu-like symptoms, shortness of breath, cough etc.) before then, please call [phone].  If you test positive for Covid 19 in the 2 weeks post procedure, please call and report this information to us.    If any biopsies were taken you will be contacted by phone or by letter within the next 1-3 weeks.  Please call us at [phone] if you have not heard about the biopsies in 3 weeks.    SIGNATURES/CONFIDENTIALITY: You and/or your care partner have signed paperwork which will be entered into your electronic medical record.  These signatures attest to the   to the fact that that the information above on your After Visit Summary has been reviewed and is understood.  Full responsibility of the confidentiality of this discharge information lies with you and/or your care-partner.

## 2019-11-09 NOTE — Progress Notes (Signed)
Called to room to assist during endoscopic procedure.  Patient ID and intended procedure confirmed with present staff. Received instructions for my participation in the procedure from the performing physician.  

## 2019-11-09 NOTE — Progress Notes (Signed)
Pt's states no medical or surgical changes since previsit or office visit. 

## 2019-11-09 NOTE — Op Note (Signed)
[facility] Patient Name: Billy Morris Procedure Date: 11/09/2019 2:28 PM MRN: [id number] Endoscopist: Docia Chuck. Henrene Pastor , MD Age: 49 Referring MD:  Date of Birth: 1970-06-27 Gender: Male Account #: [id number] Procedure:                Colonoscopy with cold snare polypectomy x 1 Indications:              Screening for colorectal malignant neoplasm.                            Previous colonoscopy 2011 was normal Medicines:                Monitored Anesthesia Care Procedure:                Pre-Anesthesia Assessment:                           - Prior to the procedure, a History and Physical                            was performed, and patient medications and                            allergies were reviewed. The patient's tolerance of                            previous anesthesia was also reviewed. The risks                            and benefits of the procedure and the sedation                            options and risks were discussed with the patient.                            All questions were answered, and informed consent                            was obtained. Prior Anticoagulants: The patient has                            taken no previous anticoagulant or antiplatelet                            agents. ASA Grade Assessment: II - A patient with                            mild systemic disease. After reviewing the risks                            and benefits, the patient was deemed in                            satisfactory condition to undergo the procedure.  After obtaining informed consent, the colonoscope                            was passed under direct vision. Throughout the                            procedure, the patient's blood pressure, pulse, and                            oxygen saturations were monitored continuously. The                            Colonoscope was introduced through the anus and                             advanced to the the cecum, identified by                            appendiceal orifice and ileocecal valve. The                            ileocecal valve, appendiceal orifice, and rectum                            were photographed. The quality of the bowel                            preparation was excellent. The colonoscopy was                            performed without difficulty. The patient tolerated                            the procedure well. The bowel preparation used was                            SUPREP via split dose instruction. Scope In: 2:35:12 PM Scope Out: 2:43:35 PM Scope Withdrawal Time: 0 hours 6 minutes 58 seconds  Total Procedure Duration: 0 hours 8 minutes 23 seconds  Findings:                 A 6 mm polyp was found in the ascending colon. The                            polyp was sessile. The polyp was removed with a                            cold snare. Resection and retrieval were complete.                            A few right-sided diverticula were also noted.                           Internal hemorrhoids were found during  retroflexion. The hemorrhoids were small.                           The exam was otherwise without abnormality on                            direct and retroflexion views. Complications:            No immediate complications. Estimated blood loss:                            None. Estimated Blood Loss:     Estimated blood loss: none. Impression:               - One 6 mm polyp in the ascending colon, removed                            with a cold snare. Resected and retrieved.                           - Internal hemorrhoids. Occasional diverticulum.                           - The examination was otherwise normal on direct                            and retroflexion views. Recommendation:           - Repeat colonoscopy in 5 years for surveillance.                           - Patient has a contact number  available for                            emergencies. The signs and symptoms of potential                            delayed complications were discussed with the                            patient. Return to normal activities tomorrow.                            Written discharge instructions were provided to the                            patient.                           - Resume previous diet.                           - Continue present medications.                           - Await pathology results. Docia Chuck. Henrene Pastor, MD 11/09/2019 2:51:13 PM This report has been signed electronically.

## 2019-11-09 NOTE — Op Note (Signed)
[facility] Patient Name: Billy Morris Procedure Date: 11/09/2019 2:25 PM MRN: [id number] Endoscopist: Docia Chuck. Henrene Pastor , MD Age: 49 Referring MD:  Date of Birth: [birth year] Gender: Male Account #: [id number] Procedure:                Upper GI endoscopy Indications:              Esophageal reflux Medicines:                Monitored Anesthesia Care Procedure:                Pre-Anesthesia Assessment:                           - Prior to the procedure, a History and Physical                            was performed, and patient medications and                            allergies were reviewed. The patient's tolerance of                            previous anesthesia was also reviewed. The risks                            and benefits of the procedure and the sedation                            options and risks were discussed with the patient.                            All questions were answered, and informed consent                            was obtained. Prior Anticoagulants: The patient has                            taken no previous anticoagulant or antiplatelet                            agents. ASA Grade Assessment: II - A patient with                            mild systemic disease. After reviewing the risks                            and benefits, the patient was deemed in                            satisfactory condition to undergo the procedure.                           After obtaining informed consent, the endoscope was  passed under direct vision. Throughout the                            procedure, the patient's blood pressure, pulse, and                            oxygen saturations were monitored continuously. The                            Endoscope was introduced through the mouth, and                            advanced to the second part of duodenum. The upper                            GI endoscopy was accomplished without  difficulty.                            The patient tolerated the procedure well. Scope In: Scope Out: Findings:                 The esophagus was normal. No Barrett's esophagus.                           The stomach was normal.                           The examined duodenum was normal.                           The cardia and gastric fundus were normal on                            retroflexion. Complications:            No immediate complications. Estimated Blood Loss:     Estimated blood loss: none. Impression:               - Normal esophagus. No Barrett's esophagus.                           - Normal stomach.                           - Normal examined duodenum.                           - No specimens collected. Recommendation:           - Patient has a contact number available for                            emergencies. The signs and symptoms of potential                            delayed complications were discussed with the  patient. Return to normal activities tomorrow.                            Written discharge instructions were provided to the                            patient.                           - Resume previous diet.                           - Continue present medications. Docia Chuck. Henrene Pastor, MD 11/09/2019 2:57:34 PM This report has been signed electronically.

## 2019-11-13 ENCOUNTER — Telehealth: Payer: Self-pay | Admitting: *Deleted

## 2019-11-13 NOTE — Telephone Encounter (Signed)
°  Follow up Call-  Call back number 11/09/2019  Post procedure Call Back phone  # [phone]  Permission to leave phone message Yes  Some recent data might be hidden     Patient questions:  Do you have a fever, pain , or abdominal swelling? No. Pain Score  0 *  Have you tolerated food without any problems? Yes.    Have you been able to return to your normal activities? Yes.    Do you have any questions about your discharge instructions: Diet   No. Medications  No. Follow up visit  No.  Do you have questions or concerns about your Care? No.  Actions: * If pain score is 4 or above: No action needed, pain <4.  1. Have you developed a fever since your procedure? no  2.   Have you had an respiratory symptoms (SOB or cough) since your procedure? no  3.   Have you tested positive for COVID 19 since your procedure no  4.   Have you had any family members/close contacts diagnosed with the COVID 19 since your procedure?  no   If yes to any of these questions please route to Joylene John, RN and Joella Prince, RN

## 2019-11-16 ENCOUNTER — Encounter: Payer: Self-pay | Admitting: Internal Medicine

## 2020-02-19 ENCOUNTER — Telehealth: Payer: Self-pay | Admitting: Allergy and Immunology

## 2020-02-19 MED ORDER — EPINEPHRINE 0.3 MG/0.3ML IJ SOAJ: 0.3000 mg | INTRAMUSCULAR | 0 refills | Status: DC | PRN

## 2020-02-19 MED ORDER — ALBUTEROL SULFATE HFA 108 (90 BASE) MCG/ACT IN AERS: INHALATION_SPRAY | RESPIRATORY_TRACT | 0 refills | Status: DC

## 2020-02-19 NOTE — Telephone Encounter (Signed)
Patient had to reschedule appointment on 02-20-20 due to developing flu-like symptoms, at home COVID test was negative on 02-16-20. Patient would like Epi Pen and albuterol refilled before his appointment on 02-27-20 sent to [facility] in [place].   Please advise.

## 2020-02-19 NOTE — Telephone Encounter (Signed)
Refills have been sent in and patient has been made aware.

## 2020-02-20 ENCOUNTER — Ambulatory Visit: Payer: Managed Care, Other (non HMO) | Admitting: Allergy and Immunology

## 2020-02-27 ENCOUNTER — Ambulatory Visit: Payer: Managed Care, Other (non HMO) | Admitting: Allergy and Immunology

## 2020-03-17 ENCOUNTER — Other Ambulatory Visit: Payer: Self-pay | Admitting: Allergy and Immunology

## 2020-04-23 ENCOUNTER — Ambulatory Visit: Payer: Managed Care, Other (non HMO) | Admitting: Allergy and Immunology

## 2020-04-23 ENCOUNTER — Encounter: Payer: Self-pay | Admitting: Allergy and Immunology

## 2020-04-23 ENCOUNTER — Other Ambulatory Visit: Payer: Self-pay

## 2020-04-23 VITALS — BP 118/80 | HR 61 | Temp 98.9°F | Resp 18 | Ht 66.0 in | Wt 202.0 lb

## 2020-04-23 DIAGNOSIS — K219 Gastro-esophageal reflux disease without esophagitis: Secondary | ICD-10-CM

## 2020-04-23 DIAGNOSIS — J301 Allergic rhinitis due to pollen: Secondary | ICD-10-CM | POA: Diagnosis not present

## 2020-04-23 DIAGNOSIS — J4540 Moderate persistent asthma, uncomplicated: Secondary | ICD-10-CM | POA: Diagnosis not present

## 2020-04-23 DIAGNOSIS — J3089 Other allergic rhinitis: Secondary | ICD-10-CM

## 2020-04-23 DIAGNOSIS — T7800XD Anaphylactic reaction due to unspecified food, subsequent encounter: Secondary | ICD-10-CM

## 2020-04-23 MED ORDER — ALBUTEROL SULFATE HFA 108 (90 BASE) MCG/ACT IN AERS: INHALATION_SPRAY | RESPIRATORY_TRACT | 2 refills | Status: DC

## 2020-04-23 MED ORDER — QVAR REDIHALER 80 MCG/ACT IN AERB: INHALATION_SPRAY | RESPIRATORY_TRACT | 5 refills | Status: DC

## 2020-04-23 MED ORDER — LEVOCETIRIZINE DIHYDROCHLORIDE 5 MG PO TABS: ORAL_TABLET | ORAL | 5 refills | Status: DC

## 2020-04-23 NOTE — Progress Notes (Signed)
[facility] [place] [facility] [place]   Follow-up Note  Referring Provider: Biagio Borg, MD Primary Provider: Biagio Borg, MD Date of Office Visit: 04/23/2020  Subjective:   Billy Morris (DOB: March 10, 1970) is a 50 y.o. male who returns to the [facility] on 04/23/2020 in re-evaluation of the following:  HPI: Billy Morris returns to this clinic in reevaluation of asthma and allergic rhinitis and LPR and a food allergy directed against mammal consumption.  His last visit to this clinic was 22 August 2019.  It does not sound as though he has required a systemic steroid or antibiotic for any type of airway issue and overall he has had a very good interval of time while consistently using anti-inflammatory agents for both his upper and lower airways.  He continues on Qnasl and Flonase consistently.  Rarely does use a short acting bronchodilator and he can exercise without any difficulty.  His reflux disease is under pretty good control as long as he watches what he eats.  If he eats a large meal then he does develop some problems with regurgitation and it does irritate his throat somewhat.  He has not been having any sternal chest pain.  He is using his Prevacid twice a day.  He has dramatically consolidated all of his caffeine consumption.  He remains away from all beef and deer consumption.  He has received 2 Pfizer COVID vaccines and a flu vaccine.  Allergies as of 04/23/2020      Reactions   Beef (bovine) Protein Hives   All red meat causes hives   Ciprofloxacin Other (See Comments), Diarrhea   Bloody stool Bloody stool Stools have bloody mucous   Penicillins Rash   Sulfa Antibiotics Rash   Tiotropium Bromide Monohydrate Other (See Comments)   heart racing and paresthesias   Aspirin    Other reaction(s): Other (See Comments)   Nabumetone    Other reaction(s): Other (See Comments) weakness weakness   Sulfonamide Derivatives       Medication List       albuterol 108 (90 Base) MCG/ACT inhaler Commonly known as: ProAir HFA INHALE TWO PUFFS 4 TIMES DAILY AS NEEDED FOR SHORTNESS OF BREATH   citalopram 20 MG tablet Commonly known as: CELEXA Take 1 tablet (20 mg total) by mouth daily.   clonazePAM 0.5 MG tablet Commonly known as: KLONOPIN Take 1 tablet (0.5 mg total) by mouth 2 (two) times daily as needed for anxiety.   EPINEPHrine 0.3 mg/0.3 mL Soaj injection Commonly known as: Auvi-Q Inject 0.3 mg into the muscle as needed for anaphylaxis.   fluticasone 50 MCG/ACT nasal spray Commonly known as: FLONASE Place 2 sprays into both nostrils daily.   ketoconazole 2 % cream Commonly known as: NIZORAL Apply 1 application topically daily.   lansoprazole 30 MG capsule Commonly known as: PREVACID TAKE 1 CAPSULE 1-2 TIMES DAILY AS NEEDED.   levocetirizine 5 MG tablet Commonly known as: XYZAL TAKE 1 TABLET BY MOUTH EVERY DAY IN THE EVENING AS NEEDED.   meloxicam 15 MG tablet Commonly known as: MOBIC TAKE ONE TABLET BY MOUTH ONCE DAILY AS NEEDED   nystatin powder Commonly known as: MYCOSTATIN/NYSTOP Use as directed twice per day to affected area as needed   Qvar RediHaler 80 MCG/ACT inhaler Generic drug: beclomethasone USE 2 PUFFS 1-2 TIMES PER DAY   Qvar RediHaler 80 MCG/ACT inhaler Generic drug: beclomethasone SMARTSIG:1-2 Puff(s) Via Inhaler Twice Daily   vitamin B-12 1000 MCG  tablet Commonly known as: CYANOCOBALAMIN Take 1 tablet (1,000 mcg total) by mouth daily.       Past Medical History:  Diagnosis Date  . Allergic rhinitis   . Anal fissure   . Anxiety   . Asthma   . COPD (chronic obstructive pulmonary disease) ([place])   . Diverticulosis   . GERD (gastroesophageal reflux disease)   . H. pylori infection   . Hearing loss    chronic right  . Hiatal hernia   . Hyperlipidemia   . LBP (low back pain)   . TMJ (dislocation of temporomandibular joint)     Past Surgical History:  Procedure Laterality Date   . laser retinopexy Right 2015  . STAPEDECTOMY    . VASECTOMY      Review of systems negative except as noted in HPI / PMHx or noted below:  Review of Systems  Constitutional: Negative.   HENT: Negative.   Eyes: Negative.   Respiratory: Negative.   Cardiovascular: Negative.   Gastrointestinal: Negative.   Genitourinary: Negative.   Musculoskeletal: Negative.   Skin: Negative.   Neurological: Negative.   Endo/Heme/Allergies: Negative.   Psychiatric/Behavioral: Negative.      Objective:   Vitals:   04/23/20 1138  BP: 118/80  Pulse: 61  Resp: 18  Temp: 98.9 F (37.2 C)  SpO2: 98%   Height: 5\' 6"  (167.6 cm)  Weight: 202 lb (91.6 kg)   Physical Exam Constitutional:      Appearance: He is not diaphoretic.  HENT:     Head: Normocephalic.     Right Ear: Tympanic membrane, ear canal and external ear normal.     Left Ear: Tympanic membrane, ear canal and external ear normal.     Nose: Nose normal. No mucosal edema or rhinorrhea.     Mouth/Throat:     Pharynx: Uvula midline. No oropharyngeal exudate.  Eyes:     Conjunctiva/sclera: Conjunctivae normal.  Neck:     Thyroid: No thyromegaly.     Trachea: Trachea normal. No tracheal tenderness or tracheal deviation.  Cardiovascular:     Rate and Rhythm: Normal rate and regular rhythm.     Heart sounds: Normal heart sounds, S1 normal and S2 normal. No murmur heard.   Pulmonary:     Effort: No respiratory distress.     Breath sounds: Normal breath sounds. No stridor. No wheezing or rales.  Lymphadenopathy:     Head:     Right Morris of head: No tonsillar adenopathy.     Left Morris of head: No tonsillar adenopathy.     Cervical: No cervical adenopathy.  Skin:    Findings: No erythema or rash.     Nails: There is no clubbing.  Neurological:     Mental Status: He is alert.     Diagnostics:    Spirometry was performed and demonstrated an FEV1 of 2.45 at 70 % of predicted.  Assessment and Plan:   1. Asthma,  moderate persistent, well-controlled   2. Perennial allergic rhinitis   3. Seasonal allergic rhinitis due to pollen   4. LPRD (laryngopharyngeal reflux disease)   5. Anaphylactic shock due to food, subsequent encounter     1.  Continue to perform allergen avoidance measures - beef, dust mite, pollen, cat, dog, mold  2.  Continue to treat and prevent inflammation:   A.  Flonase - 1-2 sprays each nostril daily  B.  Qvar 80 -2 inhalations 1-2 times per day  3.  Continue to treat and prevent  reflux:   A.  Prevacid 30 mg -1 tablet 1-2 times a day  B.  Continue to minimize caffeine and chocolate consumption  4.  If needed:   A.  Pro-air HFA -2 inhalations every 4-6 hours  B.  Xyzal 5 mg -1 tablet daily  C.  Auvi-Q 0.3, Benadryl, MD/ER evaluation for allergic reaction  5.  "Action Plan" for flare up::   A.  Increase Qvar to 3 inhalations 3 times per day  B.  Albuterol HFA if needed.   6.  Return to clinic in 6 months or earlier if problem  7. Different springtime plan???  Billy Morris will continue to utilize anti-inflammatory agents for both his upper and lower airway and continue on therapy directed against reflux as noted above.  Assuming he does well with this plan I will see him back in this clinic in 6 months.  If he has a difficult interval of time as he goes through this upcoming pollination season we may need to change his plan.  He will contact me should he have difficulty in the face of the plan noted above.  Billy Katz, MD Allergy / Immunology [place]

## 2020-04-23 NOTE — Patient Instructions (Signed)
  1.  Continue to perform allergen avoidance measures - beef, dust mite, pollen, cat, dog, mold  2.  Continue to treat and prevent inflammation:   A.  Flonase - 1-2 sprays each nostril daily  B.  Qvar 80 -2 inhalations 1-2 times per day  3.  Continue to treat and prevent reflux:   A.  Prevacid 30 mg -1 tablet 1-2 times a day  B.  Continue to minimize caffeine and chocolate consumption  4.  If needed:   A.  Pro-air HFA -2 inhalations every 4-6 hours  B.  Xyzal 5 mg -1 tablet daily  C.  Auvi-Q 0.3, Benadryl, MD/ER evaluation for allergic reaction  5.  "Action Plan" for flare up::   A.  Increase Qvar to 3 inhalations 3 times per day  B.  Albuterol HFA if needed.   6.  Return to clinic in 6 months or earlier if problem  7. Different springtime plan???

## 2020-04-24 ENCOUNTER — Encounter: Payer: Self-pay | Admitting: Allergy and Immunology

## 2020-05-09 ENCOUNTER — Other Ambulatory Visit: Payer: Self-pay

## 2020-05-10 ENCOUNTER — Other Ambulatory Visit: Payer: Self-pay

## 2020-05-10 ENCOUNTER — Ambulatory Visit (INDEPENDENT_AMBULATORY_CARE_PROVIDER_SITE_OTHER): Payer: Managed Care, Other (non HMO) | Admitting: Internal Medicine

## 2020-05-10 ENCOUNTER — Other Ambulatory Visit: Payer: Self-pay | Admitting: Internal Medicine

## 2020-05-10 ENCOUNTER — Encounter: Payer: Self-pay | Admitting: Internal Medicine

## 2020-05-10 VITALS — BP 124/86 | HR 61 | Ht 66.0 in | Wt 197.0 lb

## 2020-05-10 DIAGNOSIS — E7800 Pure hypercholesterolemia, unspecified: Secondary | ICD-10-CM | POA: Diagnosis not present

## 2020-05-10 DIAGNOSIS — E785 Hyperlipidemia, unspecified: Secondary | ICD-10-CM | POA: Diagnosis not present

## 2020-05-10 DIAGNOSIS — R739 Hyperglycemia, unspecified: Secondary | ICD-10-CM

## 2020-05-10 DIAGNOSIS — Z0001 Encounter for general adult medical examination with abnormal findings: Secondary | ICD-10-CM | POA: Diagnosis not present

## 2020-05-10 DIAGNOSIS — Z1159 Encounter for screening for other viral diseases: Secondary | ICD-10-CM

## 2020-05-10 DIAGNOSIS — E538 Deficiency of other specified B group vitamins: Secondary | ICD-10-CM | POA: Diagnosis not present

## 2020-05-10 DIAGNOSIS — M5416 Radiculopathy, lumbar region: Secondary | ICD-10-CM | POA: Diagnosis not present

## 2020-05-10 DIAGNOSIS — Z23 Encounter for immunization: Secondary | ICD-10-CM | POA: Diagnosis not present

## 2020-05-10 DIAGNOSIS — R931 Abnormal findings on diagnostic imaging of heart and coronary circulation: Secondary | ICD-10-CM

## 2020-05-10 DIAGNOSIS — E559 Vitamin D deficiency, unspecified: Secondary | ICD-10-CM

## 2020-05-10 LAB — CBC WITH DIFFERENTIAL/PLATELET
Basophils Absolute: 0 K/uL (ref 0.0–0.1)
Basophils Relative: 0.9 % (ref 0.0–3.0)
Eosinophils Absolute: 0.1 K/uL (ref 0.0–0.7)
Eosinophils Relative: 2 % (ref 0.0–5.0)
HCT: 42.3 % (ref 39.0–52.0)
Hemoglobin: 14.5 g/dL (ref 13.0–17.0)
Lymphocytes Relative: 27.8 % (ref 12.0–46.0)
Lymphs Abs: 1.2 K/uL (ref 0.7–4.0)
MCHC: 34.3 g/dL (ref 30.0–36.0)
MCV: 92.9 fl (ref 78.0–100.0)
Monocytes Absolute: 0.4 K/uL (ref 0.1–1.0)
Monocytes Relative: 8.8 % (ref 3.0–12.0)
Neutro Abs: 2.7 K/uL (ref 1.4–7.7)
Neutrophils Relative %: 60.5 % (ref 43.0–77.0)
Platelets: 242 K/uL (ref 150.0–400.0)
RBC: 4.55 Mil/uL (ref 4.22–5.81)
RDW: 12.7 % (ref 11.5–15.5)
WBC: 4.5 K/uL (ref 4.0–10.5)

## 2020-05-10 LAB — URINALYSIS, ROUTINE W REFLEX MICROSCOPIC
Bilirubin Urine: NEGATIVE
Hgb urine dipstick: NEGATIVE
Ketones, ur: NEGATIVE
Leukocytes,Ua: NEGATIVE
Nitrite: NEGATIVE
RBC / HPF: NONE SEEN (ref 0–?)
Specific Gravity, Urine: 1.02 (ref 1.000–1.030)
Total Protein, Urine: NEGATIVE
Urine Glucose: NEGATIVE
Urobilinogen, UA: 0.2 (ref 0.0–1.0)
WBC, UA: NONE SEEN (ref 0–?)
pH: 8.5 — AB (ref 5.0–8.0)

## 2020-05-10 LAB — BASIC METABOLIC PANEL
BUN: 15 mg/dL (ref 6–23)
CO2: 29 mEq/L (ref 19–32)
Calcium: 9.5 mg/dL (ref 8.4–10.5)
Chloride: 105 mEq/L (ref 96–112)
Creatinine, Ser: 0.85 mg/dL (ref 0.40–1.50)
GFR: 101.83 mL/min (ref >60.00)
Glucose, Bld: 102 mg/dL — ABNORMAL HIGH (ref 70–99)
Potassium: 4.3 mEq/L (ref 3.5–5.1)
Sodium: 141 mEq/L (ref 135–145)

## 2020-05-10 LAB — LIPID PANEL
Cholesterol: 213 mg/dL — ABNORMAL HIGH (ref 0–200)
HDL: 63.9 mg/dL
LDL Cholesterol: 132 mg/dL — ABNORMAL HIGH (ref 0–99)
NonHDL: 149.22
Total CHOL/HDL Ratio: 3
Triglycerides: 88 mg/dL (ref 0.0–149.0)
VLDL: 17.6 mg/dL (ref 0.0–40.0)

## 2020-05-10 LAB — HEPATIC FUNCTION PANEL
ALT: 30 U/L (ref 0–53)
AST: 20 U/L (ref 0–37)
Albumin: 4.5 g/dL (ref 3.5–5.2)
Alkaline Phosphatase: 74 U/L (ref 39–117)
Bilirubin, Direct: 0.1 mg/dL (ref 0.0–0.3)
Total Bilirubin: 1 mg/dL (ref 0.2–1.2)
Total Protein: 7.2 g/dL (ref 6.0–8.3)

## 2020-05-10 LAB — VITAMIN D 25 HYDROXY (VIT D DEFICIENCY, FRACTURES): VITD: 30.15 ng/mL (ref 30.00–100.00)

## 2020-05-10 LAB — VITAMIN B12: Vitamin B-12: 775 pg/mL (ref 211–911)

## 2020-05-10 LAB — HEMOGLOBIN A1C: Hgb A1c MFr Bld: 5.4 % (ref 4.6–6.5)

## 2020-05-10 LAB — TSH: TSH: 1.38 u[IU]/mL (ref 0.35–4.50)

## 2020-05-10 MED ORDER — CLONAZEPAM 0.5 MG PO TABS: 0.5000 mg | ORAL_TABLET | Freq: Two times a day (BID) | ORAL | 1 refills | Status: DC | PRN

## 2020-05-10 MED ORDER — GABAPENTIN 100 MG PO CAPS: 100.0000 mg | ORAL_CAPSULE | Freq: Three times a day (TID) | ORAL | 5 refills | Status: DC

## 2020-05-10 MED ORDER — CITALOPRAM HYDROBROMIDE 20 MG PO TABS: 20.0000 mg | ORAL_TABLET | Freq: Every day | ORAL | 3 refills | Status: DC

## 2020-05-10 MED ORDER — ATORVASTATIN CALCIUM 10 MG PO TABS: 10.0000 mg | ORAL_TABLET | Freq: Every day | ORAL | 3 refills | Status: DC

## 2020-05-10 MED ORDER — THERA-D 2000 50 MCG (2000 UT) PO TABS: ORAL_TABLET | ORAL | 99 refills | Status: DC

## 2020-05-10 NOTE — Patient Instructions (Addendum)
You had the Tdap tetanus shot today  Please take all new medication as prescribed - the gabapentin as directed  Please continue all other medications as before, and refills have been done if requested.  Please have the pharmacy call with any other refills you may need.  Please continue your efforts at being more active, low cholesterol diet, and weight control.  You are otherwise up to date with prevention measures today.  Please keep your appointments with your specialists as you may have planned  Please go to the LAB at the blood drawing area for the tests to be done  You will be contacted by phone if any changes need to be made immediately.  Otherwise, you will receive a letter about your results with an explanation, but please check with MyChart first.  Please remember to sign up for MyChart if you have not done so, as this will be important to you in the future with finding out test results, communicating by private email, and scheduling acute appointments online when needed.  Please make an Appointment to return for your 1 year visit, or sooner if needed

## 2020-05-10 NOTE — Progress Notes (Signed)
Patient ID: Billy Morris, male   DOB: 07-27-1970, 50 y.o.   MRN: [id number]         Chief Complaint:: wellness exam and low vit d and b12, hld, elevated cardiac CT score, left lateral foot pain, chronic lbp with left left pain flare x 2 wks       HPI:  Billy Morris is a 50 y.o. male here for wellness exam; due for hep c screen, and Tdp o/w up to date with preventive referrals and immunizations                        Also not taking vit d and b12 regularly, declines statin despite elevated cardiac calcium score.  Several days ago had a single episode of searing , burning left lateral foot pain along the 5th ray for several minutes a few days ago, without trauma, redness or other skin change.  Pt continues to have recurring LBP without change in severity, bowel or bladder change, fever, wt loss,  worsening LE numbness/weakness, gait change or falls, but has increased freq and severity of left leg pain during the day often in the past 2 wks.  No other new complaints.   Pt denies fever, wt loss, night sweats, loss of appetite, or other constitutional symptoms  Denies worsening depressive symptoms, suicidal ideation, or panic; has ongoing anxiety.     Wt Readings from Last 3 Encounters:  05/10/20 197 lb (89.4 kg)  04/23/20 202 lb (91.6 kg)  10/04/19 193 lb 9.6 oz (87.8 kg)   BP Readings from Last 3 Encounters:  05/10/20 124/86  04/23/20 118/80  11/09/19 (!) 117/51   Immunization History  Administered Date(s) Administered  . Influenza Split 11/05/2011  . Influenza, Quadrivalent, Recombinant, Inj, Pf 11/25/2018  . Influenza,inj,Quad PF,6+ Mos 11/08/2012, 10/03/2013, 01/31/2015, 12/08/2016, 11/09/2017, 12/17/2017  . PFIZER(Purple Top)SARS-COV-2 Vaccination 04/14/2019, 05/16/2019  . Td 02/09/2005, 04/15/2010  . Tdap 05/10/2020   Health Maintenance Due  Topic Date Due  . Hepatitis C Screening  Never done      Past Medical History:  Diagnosis Date  . Allergic rhinitis   . Anal fissure   .  Anxiety   . Asthma   . COPD (chronic obstructive pulmonary disease) ([place])   . Diverticulosis   . GERD (gastroesophageal reflux disease)   . H. pylori infection   . Hearing loss    chronic right  . Hiatal hernia   . Hyperlipidemia   . LBP (low back pain)   . TMJ (dislocation of temporomandibular joint)    Past Surgical History:  Procedure Laterality Date  . laser retinopexy Right 2015  . STAPEDECTOMY    . VASECTOMY      reports that he has never smoked. He has never used smokeless tobacco. He reports that he does not drink alcohol and does not use drugs. family history includes Allergies in an other family member; Asthma in his son; COPD in his maternal grandfather; Colon polyps in his maternal grandfather; Diabetes in his maternal aunt, maternal grandmother, and paternal aunt; Heart attack in his maternal grandmother; Heart failure in an other family member; Hyperlipidemia in his father and mother; Irritable bowel syndrome in his maternal aunt; Other in his mother; Ovarian cancer in his paternal grandmother; Prostate cancer in his maternal grandfather; Stroke in his maternal aunt. Allergies  Allergen Reactions  . Beef (Bovine) Protein Hives    All red meat causes hives  . Ciprofloxacin Other (See Comments) and  Diarrhea    Bloody stool Bloody stool Stools have bloody mucous  . Penicillins Rash  . Sulfa Antibiotics Rash  . Tiotropium Bromide Monohydrate Other (See Comments)    heart racing and paresthesias  . Aspirin     Other reaction(s): Other (See Comments)  . Nabumetone     Other reaction(s): Other (See Comments) weakness weakness  . Sulfonamide Derivatives    Current Outpatient Medications on File Prior to Visit  Medication Sig Dispense Refill  . albuterol (PROAIR HFA) 108 (90 Base) MCG/ACT inhaler INHALE TWO PUFFS 4 TIMES DAILY AS NEEDED FOR SHORTNESS OF BREATH 18 g 2  . EPINEPHrine (AUVI-Q) 0.3 mg/0.3 mL IJ SOAJ injection Inject 0.3 mg into the muscle as needed for  anaphylaxis. 1 each 0  . fluticasone (FLONASE) 50 MCG/ACT nasal spray Place 2 sprays into both nostrils daily. 16 g 5  . ketoconazole (NIZORAL) 2 % cream Apply 1 application topically daily. 30 g 1  . lansoprazole (PREVACID) 30 MG capsule TAKE 1 CAPSULE 1-2 TIMES DAILY AS NEEDED. 180 capsule 1  . levocetirizine (XYZAL) 5 MG tablet TAKE 1 TABLET BY MOUTH EVERY DAY IN THE EVENING AS NEEDED. 30 tablet 5  . meloxicam (MOBIC) 15 MG tablet TAKE ONE TABLET BY MOUTH ONCE DAILY AS NEEDED 30 tablet 0  . nystatin (MYCOSTATIN/NYSTOP) powder Use as directed twice per day to affected area as needed 30 g 2  . QVAR REDIHALER 80 MCG/ACT inhaler USE 2 PUFFS 1-2 TIMES PER DAY 10.6 g 0  . QVAR REDIHALER 80 MCG/ACT inhaler SMARTSIG:1-2 Puff(s) Via Inhaler Twice Daily 1 each 5  . vitamin B-12 (CYANOCOBALAMIN) 1000 MCG tablet Take 1 tablet (1,000 mcg total) by mouth daily. 90 tablet 3   No current facility-administered medications on file prior to visit.        ROS:  All others reviewed and negative.  Objective        PE:  BP 124/86   Pulse 61   Ht 5\' 6"  (1.676 m)   Wt 197 lb (89.4 kg)   SpO2 99%   BMI 31.80 kg/m                 Constitutional: Pt appears in NAD               HENT: Head: NCAT.                Right Ear: External ear normal.                 Left Ear: External ear normal.                Eyes: . Pupils are equal, round, and reactive to light. Conjunctivae and EOM are normal               Nose: without d/c or deformity               Neck: Neck supple. Gross normal ROM               Cardiovascular: Normal rate and regular rhythm.                 Pulmonary/Chest: Effort normal and breath sounds without rales or wheezing.                Abd:  Soft, NT, ND, + BS, no organomegaly               Neurological: Pt is alert. At baseline orientation, motor  grossly intact               Skin: Skin is warm. No rashes, no other new lesions, LE edema - none               Psychiatric: Pt behavior is normal  without agitation   Micro: none  Cardiac tracings I have personally interpreted today:  none  Pertinent Radiological findings (summarize): none   Lab Results  Component Value Date   WBC 4.5 05/10/2020   HGB 14.5 05/10/2020   HCT 42.3 05/10/2020   PLT 242.0 05/10/2020   GLUCOSE 102 (H) 05/10/2020   CHOL 213 (H) 05/10/2020   TRIG 88.0 05/10/2020   HDL 63.90 05/10/2020   LDLCALC 132 (H) 05/10/2020   ALT 30 05/10/2020   AST 20 05/10/2020   NA 141 05/10/2020   K 4.3 05/10/2020   CL 105 05/10/2020   CREATININE 0.85 05/10/2020   BUN 15 05/10/2020   CO2 29 05/10/2020   TSH 1.38 05/10/2020   PSA 0.36 05/05/2019   HGBA1C 5.4 05/10/2020   Assessment/Plan:  TARIUS STANGELO is a 50 y.o. White or Caucasian [1] male with  has a past medical history of Allergic rhinitis, Anal fissure, Anxiety, Asthma, COPD (chronic obstructive pulmonary disease) ([place]), Diverticulosis, GERD (gastroesophageal reflux disease), H. pylori infection, Hearing loss, Hiatal hernia, Hyperlipidemia, LBP (low back pain), and TMJ (dislocation of temporomandibular joint).  Encounter for well adult exam with abnormal findings Age and sex appropriate education and counseling updated with regular exercise and diet Referrals for preventative services - for hep c scrren Immunizations addressed - for Tdap Smoking counseling  - none needed Evidence for depression or other mood disorder - none significant Most recent labs reviewed. I have personally reviewed and have noted: 1) the patient's medical and social history 2) The patient's current medications and supplements 3) The patient's height, weight, and BMI have been recorded in the chart   Vitamin D deficiency Last vitamin D Lab Results  Component Value Date   VD25OH 30.15 05/10/2020   Low noraml, for 2000 u oral replacement  Hyperglycemia Lab Results  Component Value Date   HGBA1C 5.4 05/10/2020   Stable, pt to continue current medical treatment  -  deit   HLD (hyperlipidemia) Lab Results  Component Value Date   LDLCALC 132 (H) 05/10/2020   Stable, pt to continue low chol diet, declines statin  Elevated coronary artery calcium score Declines statin for now, prefers low chol diet  B12 deficiency Lab Results  Component Value Date   [id number] 226 05/10/2020   Stable, cont oral replacement - b12 1000 mcg qd   Left lumbar radiculopathy With worsening intemrittent siciatica pain, For increased gabapentin 100 tid for better pain control,  to f/u any worsening symptoms or concerns  Followup: Return in about 1 year (around 05/10/2021).  Cathlean Cower, MD 05/11/2020 11:11 PM [facility]

## 2020-05-11 ENCOUNTER — Encounter: Payer: Self-pay | Admitting: Internal Medicine

## 2020-05-11 NOTE — Assessment & Plan Note (Signed)
Lab Results  Component Value Date   HGBA1C 5.4 05/10/2020   Stable, pt to continue current medical treatment  - deit

## 2020-05-11 NOTE — Assessment & Plan Note (Signed)
Lab Results  Component Value Date   [id number] 05/10/2020   Stable, cont oral replacement - b12 1000 mcg qd

## 2020-05-11 NOTE — Assessment & Plan Note (Signed)
With worsening intemrittent siciatica pain, For increased gabapentin 100 tid for better pain control,  to f/u any worsening symptoms or concerns

## 2020-05-11 NOTE — Assessment & Plan Note (Signed)
Last vitamin D Lab Results  Component Value Date   VD25OH 30.15 05/10/2020   Low noraml, for 2000 u oral replacement

## 2020-05-11 NOTE — Assessment & Plan Note (Signed)
Declines statin for now, prefers low chol diet

## 2020-05-11 NOTE — Assessment & Plan Note (Signed)
Age and sex appropriate education and counseling updated with regular exercise and diet Referrals for preventative services - for hep c scrren Immunizations addressed - for Tdap Smoking counseling  - none needed Evidence for depression or other mood disorder - none significant Most recent labs reviewed. I have personally reviewed and have noted: 1) the patient's medical and social history 2) The patient's current medications and supplements 3) The patient's height, weight, and BMI have been recorded in the chart

## 2020-05-11 NOTE — Assessment & Plan Note (Signed)
Lab Results  Component Value Date   LDLCALC 132 (H) 05/10/2020   Stable, pt to continue low chol diet, declines statin

## 2020-05-13 LAB — HEPATITIS C ANTIBODY
Hepatitis C Ab: NONREACTIVE
SIGNAL TO CUT-OFF: 0 (ref <1.00)

## 2020-07-15 ENCOUNTER — Other Ambulatory Visit: Payer: Self-pay | Admitting: Allergy and Immunology

## 2020-07-15 ENCOUNTER — Other Ambulatory Visit: Payer: Self-pay

## 2020-07-15 MED ORDER — LANSOPRAZOLE 30 MG PO CPDR: DELAYED_RELEASE_CAPSULE | ORAL | 0 refills | Status: DC

## 2020-07-15 NOTE — Telephone Encounter (Signed)
Insurance will not cover bid is it okay to write rx for daily?

## 2020-07-16 NOTE — Telephone Encounter (Signed)
Please let Billy Morris know that his insurance company will not pay for Prevacid twice a day.  We can order it 1 time per day.  However, the note from the pharmacy suggest that this agent may not be available.  We need to work through that issue as well.  Is it available?

## 2020-07-16 NOTE — Telephone Encounter (Signed)
Billy Morris at the pharmacy said that the pharmacy selected the wrong comment back that the product is very much avaliable, just his insurance will not cover the bid. We have to change rx to 1 tablet daily. Informed pt of the change and he is okay with the change.

## 2020-08-19 ENCOUNTER — Encounter: Payer: Self-pay | Admitting: Internal Medicine

## 2020-10-11 ENCOUNTER — Encounter: Payer: Self-pay | Admitting: Internal Medicine

## 2020-10-11 ENCOUNTER — Ambulatory Visit: Payer: Managed Care, Other (non HMO) | Admitting: Internal Medicine

## 2020-10-11 ENCOUNTER — Other Ambulatory Visit: Payer: Self-pay

## 2020-10-11 VITALS — BP 114/80 | HR 70 | Temp 98.1°F | Ht 66.0 in | Wt 200.0 lb

## 2020-10-11 DIAGNOSIS — E559 Vitamin D deficiency, unspecified: Secondary | ICD-10-CM | POA: Diagnosis not present

## 2020-10-11 DIAGNOSIS — R739 Hyperglycemia, unspecified: Secondary | ICD-10-CM

## 2020-10-11 DIAGNOSIS — F411 Generalized anxiety disorder: Secondary | ICD-10-CM | POA: Diagnosis not present

## 2020-10-11 DIAGNOSIS — G4710 Hypersomnia, unspecified: Secondary | ICD-10-CM | POA: Diagnosis not present

## 2020-10-11 DIAGNOSIS — E7800 Pure hypercholesterolemia, unspecified: Secondary | ICD-10-CM

## 2020-10-11 DIAGNOSIS — E538 Deficiency of other specified B group vitamins: Secondary | ICD-10-CM

## 2020-10-11 DIAGNOSIS — K429 Umbilical hernia without obstruction or gangrene: Secondary | ICD-10-CM

## 2020-10-11 NOTE — Progress Notes (Signed)
Patient ID: Billy Morris, male   DOB: 1970-04-16, 50 y.o.   MRN: [id number]        Chief Complaint: follow up umbilical hernia, snoring with hypersomnolence,        HPI:  Billy Morris is a 50 y.o. 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 Mild to mod, intermittent, ecxept for a few minutes 5 days ago with severe pain like pinching and severe pain; was able to reduce it and avoid the ED visit he thought he might need;  since then has had mchmilder intermittent discomfrot but not nearly as severe.  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  Denies worsening reflux, dysphagia, n/v, bowel change or blood.  Hernia belt no help, nothing else makes better or worse.  Pt denies fever, wt loss, night sweats, loss of appetite, or other constitutional symptoms.  Does have signficiant snoring and daytime somnolence, wife wants him to be checked for sleep apea that she has as well.  Seems to be tolerating lipitor well without MSK pains today.  Denies worsening depressive symptoms, suicidal ideation, or panic; has ongoing anxiety, but does not want med tx for now.  Not taking Vit D       Wt Readings from Last 3 Encounters:  10/11/20 200 lb (90.7 kg)  05/10/20 197 lb (89.4 kg)  04/23/20 202 lb (91.6 kg)   BP Readings from Last 3 Encounters:  10/11/20 114/80  05/10/20 124/86  04/23/20 118/80         Past Medical History:  Diagnosis Date   Allergic rhinitis    Anal fissure    Anxiety    Asthma    COPD (chronic obstructive pulmonary disease) (HCC)    Diverticulosis    GERD (gastroesophageal reflux disease)    H. pylori infection    Hearing loss    chronic right   Hiatal hernia    Hyperlipidemia    LBP (low back pain)    TMJ (dislocation of temporomandibular joint)    Past Surgical History:  Procedure Laterality Date   laser retinopexy Right 2015   STAPEDECTOMY     VASECTOMY       reports that he has never smoked. He has never used smokeless tobacco. He reports that he does not drink alcohol and does not use drugs. family history includes Allergies in an other family member; Asthma in his son; COPD in his maternal grandfather; Colon polyps in his maternal grandfather; Diabetes in his maternal aunt, maternal grandmother, and paternal aunt; Heart attack in his maternal grandmother; Heart failure in an other family member; Hyperlipidemia in his father and mother; Irritable bowel syndrome in his maternal aunt; Other in his mother; Ovarian cancer in his paternal grandmother; Prostate cancer in his maternal grandfather; Stroke in his maternal aunt. Allergies  Allergen Reactions   Beef (Bovine) Protein Hives    All red meat causes hives   Ciprofloxacin Other (See Comments) and Diarrhea    Bloody stool Bloody stool Stools have bloody mucous   Penicillins Rash   Sulfa Antibiotics Rash   Tiotropium Bromide Monohydrate Other (See Comments)    heart racing and paresthesias   Aspirin     Other reaction(s): Other (See Comments)   Nabumetone     Other reaction(s): Other (See Comments) weakness weakness   Sulfonamide Derivatives    Current Outpatient Medications on  File Prior to Visit  Medication Sig Dispense Refill   albuterol (PROAIR HFA) 108 (90 Base) MCG/ACT inhaler INHALE TWO PUFFS 4 TIMES DAILY AS NEEDED FOR SHORTNESS OF BREATH 18 g 2   atorvastatin (LIPITOR) 10 MG tablet Take 1 tablet (10 mg total) by mouth daily. 90 tablet 3   Cholecalciferol (THERA-D 2000) 50 MCG (2000 UT) TABS 1 tab by mouth once daily 30 tablet 99   EPINEPHrine (AUVI-Q) 0.3 mg/0.3 mL IJ SOAJ injection Inject 0.3 mg into the muscle as needed for anaphylaxis. 1 each 0   fluticasone (FLONASE) 50 MCG/ACT nasal spray Place 2 sprays into both nostrils daily. 16 g 5   gabapentin (NEURONTIN) 100 MG capsule Take 1 capsule (100 mg total) by mouth 3 (three) times daily. 90 capsule 5   ketoconazole (NIZORAL) 2 %  cream Apply 1 application topically daily. 30 g 1   lansoprazole (PREVACID) 30 MG capsule TAKE 1 TABLET BY MOUTH EVERY DAY 30 capsule 0   levocetirizine (XYZAL) 5 MG tablet TAKE 1 TABLET BY MOUTH EVERY DAY IN THE EVENING AS NEEDED. 30 tablet 5   meloxicam (MOBIC) 15 MG tablet TAKE ONE TABLET BY MOUTH ONCE DAILY AS NEEDED 30 tablet 0   nystatin (MYCOSTATIN/NYSTOP) powder Use as directed twice per day to affected area as needed 30 g 2   QVAR REDIHALER 80 MCG/ACT inhaler USE 2 PUFFS 1-2 TIMES PER DAY 10.6 g 0   QVAR REDIHALER 80 MCG/ACT inhaler SMARTSIG:1-2 Puff(s) Via Inhaler Twice Daily 1 each 5   vitamin B-12 (CYANOCOBALAMIN) 1000 MCG tablet Take 1 tablet (1,000 mcg total) by mouth daily. 90 tablet 3   No current facility-administered medications on file prior to visit.        ROS:  All others reviewed and negative.  Objective        PE:  BP 114/80 (BP Location: Left Arm, Patient Position: Sitting, Cuff Size: Large)   Pulse 70   Temp 98.1 F (36.7 C) (Oral)   Ht '5\' 6"'$  (1.676 m)   Wt 200 lb (90.7 kg)   SpO2 97%   BMI 32.28 kg/m                 Constitutional: Pt appears in NAD               HENT: Head: NCAT.                Right Ear: External ear normal.                 Left Ear: External ear normal.                Eyes: . Pupils are equal, round, and reactive to light. Conjunctivae and EOM are normal               Nose: without d/c or deformity               Neck: Neck supple. Gross normal ROM               Cardiovascular: Normal rate and regular rhythm.                 Pulmonary/Chest: Effort normal and breath sounds without rales or wheezing.                Abd:  Soft, ND, + BS, no organomegaly; mild tender umbilical area without hernia unable to be reduced  Neurological: Pt is alert. At baseline orientation, motor grossly intact               Skin: Skin is warm. No rashes, no other new lesions, LE edema - none               Psychiatric: Pt behavior is normal  without agitation , nervous  Micro: none  Cardiac tracings I have personally interpreted today:  none  Pertinent Radiological findings (summarize): none   Lab Results  Component Value Date   WBC 4.5 05/10/2020   HGB 14.5 05/10/2020   HCT 42.3 05/10/2020   PLT 242.0 05/10/2020   GLUCOSE 102 (H) 05/10/2020   CHOL 213 (H) 05/10/2020   TRIG 88.0 05/10/2020   HDL 63.90 05/10/2020   LDLCALC 132 (H) 05/10/2020   ALT 30 05/10/2020   AST 20 05/10/2020   NA 141 05/10/2020   K 4.3 05/10/2020   CL 105 05/10/2020   CREATININE 0.85 05/10/2020   BUN 15 05/10/2020   CO2 29 05/10/2020   TSH 1.38 05/10/2020   PSA 0.36 05/05/2019   HGBA1C 5.4 05/10/2020   Assessment/Plan:  Billy Morris is a 50 y.o. White or Caucasian [1] male with  has a past medical history of Allergic rhinitis, Anal fissure, Anxiety, Asthma, COPD (chronic obstructive pulmonary disease) ([place]), Diverticulosis, GERD (gastroesophageal reflux disease), H. pylori infection, Hearing loss, Hiatal hernia, Hyperlipidemia, LBP (low back pain), and TMJ (dislocation of temporomandibular joint).  Vitamin D deficiency Last vitamin D Lab Results  Component Value Date   VD25OH 30.15 05/10/2020   Low normal, start oral replacement   B12 deficiency Lab Results  Component Value Date   VITAMINB12 775 05/10/2020   Stable, cont oral replacement - b12 1000 mcg qd   HLD (hyperlipidemia) Lab Results  Component Value Date   LDLCALC 132 (H) 05/10/2020   Uncontrolled at last visit, pt to continue current statin , declinees f/u lab for now, cont lipitor and low chol diet   Hyperglycemia Lab Results  Component Value Date   HGBA1C 5.4 05/10/2020   Stable, pt to continue current medical treatment  - diet   Hypersomnolence Also for referal pulmonary  - r/o osa  Anxiety state Mild chronic persistent, declines med tx for now  Umbilical hernia without obstruction and without gangrene Now more symtpomatic, for general surgury  referral  Followup: Return if symptoms worsen or fail to improve.  Cathlean Cower, MD 10/14/2020 9:58 PM [facility]

## 2020-10-11 NOTE — Assessment & Plan Note (Signed)
Lab Results  Component Value Date   [id number] 05/10/2020   Stable, cont oral replacement - b12 1000 mcg qd

## 2020-10-11 NOTE — Patient Instructions (Signed)
You will be contacted regarding the referral for: General surgury, and Pulmonary  Please continue all other medications as before, and refills have been done if requested.  Please have the pharmacy call with any other refills you may need.  Please continue your efforts at being more active, low cholesterol diet, and weight control.  Please keep your appointments with your specialists as you may have planned

## 2020-10-11 NOTE — Assessment & Plan Note (Addendum)
Lab Results  Component Value Date   LDLCALC 132 (H) 05/10/2020   Uncontrolled at last visit, pt to continue current statin , declinees f/u lab for now, cont lipitor and low chol diet

## 2020-10-11 NOTE — Assessment & Plan Note (Signed)
Last vitamin D Lab Results  Component Value Date   VD25OH 30.15 05/10/2020   Low normal, start oral replacement

## 2020-10-14 ENCOUNTER — Encounter: Payer: Self-pay | Admitting: Internal Medicine

## 2020-10-14 DIAGNOSIS — K429 Umbilical hernia without obstruction or gangrene: Secondary | ICD-10-CM | POA: Insufficient documentation

## 2020-10-14 DIAGNOSIS — G4710 Hypersomnia, unspecified: Secondary | ICD-10-CM | POA: Insufficient documentation

## 2020-10-14 NOTE — Assessment & Plan Note (Signed)
Also for referal pulmonary  - r/o osa

## 2020-10-14 NOTE — Assessment & Plan Note (Signed)
Now more symtpomatic, for general surgury referral

## 2020-10-14 NOTE — Assessment & Plan Note (Signed)
Lab Results  Component Value Date   HGBA1C 5.4 05/10/2020   Stable, pt to continue current medical treatment  - diet

## 2020-10-14 NOTE — Assessment & Plan Note (Signed)
Mild chronic persistent, declines med tx for now

## 2020-10-15 ENCOUNTER — Encounter: Payer: Self-pay | Admitting: Allergy and Immunology

## 2020-10-21 ENCOUNTER — Encounter: Payer: Self-pay | Admitting: Internal Medicine

## 2020-10-22 ENCOUNTER — Emergency Department (HOSPITAL_COMMUNITY)
Admission: EM | Admit: 2020-10-22 | Discharge: 2020-10-23 | Disposition: A | Discharge status: Home / Self Care | Payer: Managed Care, Other (non HMO) | Attending: Emergency Medicine | Admitting: Emergency Medicine

## 2020-10-22 ENCOUNTER — Other Ambulatory Visit: Payer: Self-pay

## 2020-10-22 ENCOUNTER — Ambulatory Visit (HOSPITAL_COMMUNITY)
Admission: EM | Admit: 2020-10-22 | Discharge: 2020-10-22 | Disposition: A | Discharge status: Home / Self Care | Payer: Managed Care, Other (non HMO)

## 2020-10-22 ENCOUNTER — Encounter (HOSPITAL_COMMUNITY): Payer: Self-pay

## 2020-10-22 DIAGNOSIS — R1030 Lower abdominal pain, unspecified: Secondary | ICD-10-CM | POA: Diagnosis present

## 2020-10-22 DIAGNOSIS — J449 Chronic obstructive pulmonary disease, unspecified: Secondary | ICD-10-CM | POA: Insufficient documentation

## 2020-10-22 DIAGNOSIS — K429 Umbilical hernia without obstruction or gangrene: Secondary | ICD-10-CM | POA: Diagnosis not present

## 2020-10-22 DIAGNOSIS — J45909 Unspecified asthma, uncomplicated: Secondary | ICD-10-CM | POA: Insufficient documentation

## 2020-10-22 DIAGNOSIS — Z7951 Long term (current) use of inhaled steroids: Secondary | ICD-10-CM | POA: Insufficient documentation

## 2020-10-22 LAB — URINALYSIS, ROUTINE W REFLEX MICROSCOPIC
Bilirubin Urine: NEGATIVE
Glucose, UA: NEGATIVE mg/dL
Hgb urine dipstick: NEGATIVE
Ketones, ur: NEGATIVE mg/dL
Leukocytes,Ua: NEGATIVE
Nitrite: NEGATIVE
Protein, ur: NEGATIVE mg/dL
Specific Gravity, Urine: 1.02 (ref 1.005–1.030)
pH: 6 (ref 5.0–8.0)

## 2020-10-22 LAB — CBC WITH DIFFERENTIAL/PLATELET
Abs Immature Granulocytes: 0.02 10*3/uL (ref 0.00–0.07)
Basophils Absolute: 0.1 10*3/uL (ref 0.0–0.1)
Basophils Relative: 1 %
Eosinophils Absolute: 0.1 10*3/uL (ref 0.0–0.5)
Eosinophils Relative: 1 %
HCT: 42.1 % (ref 39.0–52.0)
Hemoglobin: 14.5 g/dL (ref 13.0–17.0)
Immature Granulocytes: 0 %
Lymphocytes Relative: 26 %
Lymphs Abs: 1.8 10*3/uL (ref 0.7–4.0)
MCH: 32.2 pg (ref 26.0–34.0)
MCHC: 34.4 g/dL (ref 30.0–36.0)
MCV: 93.3 fL (ref 80.0–100.0)
Monocytes Absolute: 0.6 10*3/uL (ref 0.1–1.0)
Monocytes Relative: 8 %
Neutro Abs: 4.4 10*3/uL (ref 1.7–7.7)
Neutrophils Relative %: 64 %
Platelets: 242 10*3/uL (ref 150–400)
RBC: 4.51 MIL/uL (ref 4.22–5.81)
RDW: 12.2 % (ref 11.5–15.5)
WBC: 6.9 10*3/uL (ref 4.0–10.5)
nRBC: 0 % (ref 0.0–0.2)

## 2020-10-22 LAB — COMPREHENSIVE METABOLIC PANEL
ALT: 25 U/L (ref 0–44)
AST: 23 U/L (ref 15–41)
Albumin: 4.1 g/dL (ref 3.5–5.0)
Alkaline Phosphatase: 69 U/L (ref 38–126)
Anion gap: 7 (ref 5–15)
BUN: 12 mg/dL (ref 6–20)
CO2: 27 mmol/L (ref 22–32)
Calcium: 9.3 mg/dL (ref 8.9–10.3)
Chloride: 105 mmol/L (ref 98–111)
Creatinine, Ser: 0.81 mg/dL (ref 0.61–1.24)
GFR, Estimated: >60 mL/min (ref >60)
Glucose, Bld: 100 mg/dL — ABNORMAL HIGH (ref 70–99)
Potassium: 3.5 mmol/L (ref 3.5–5.1)
Sodium: 139 mmol/L (ref 135–145)
Total Bilirubin: 0.9 mg/dL (ref 0.3–1.2)
Total Protein: 6.8 g/dL (ref 6.5–8.1)

## 2020-10-22 LAB — LIPASE, BLOOD: Lipase: 38 U/L (ref 11–51)

## 2020-10-22 NOTE — Discharge Instructions (Addendum)
As you are aware, you definitely have an umbilical hernia.  Because you are having pain at this point you do need imaging.  I recommend that you either follow-up with your primary care provider to have this ordered or if you are feeling concerned that you cannot wait 2 to 3 days for this appointment you can certainly go to the emergency room now.

## 2020-10-22 NOTE — ED Provider Notes (Signed)
[facility]    CSN: [id number] Arrival date & time: 10/22/20  1401      History   Chief Complaint Chief Complaint  Patient presents with   Umbilical Hernia    HPI Billy Morris is a 50 y.o. male.   Patient reports longstanding history of umbilical hernia that has never been treated or imaged, states his primary care physician is aware and recently ordered him a referral to general surgery.  Patient states that today at work, where he does frequent heavy lifting, pushing, pulling, he noticed that the hernia had "popped out" for several hours and it began to cause him pain across his stomach lateral to his hernia on both sides.  Patient states that the hernia is still easily reducible, but is now more tender to palpation and patient states that he feels that the opening is larger than before.  Patient reports a history of GERD, denies issues with constipation, states he does not feel the need to strain when moving his bowels.  The history is provided by the patient.   Past Medical History:  Diagnosis Date   Allergic rhinitis    Anal fissure    Anxiety    Asthma    COPD (chronic obstructive pulmonary disease) (HCC)    Diverticulosis    GERD (gastroesophageal reflux disease)    H. pylori infection    Hearing loss    chronic right   Hiatal hernia    Hyperlipidemia    LBP (low back pain)    TMJ (dislocation of temporomandibular joint)     Patient Active Problem List   Diagnosis Date Noted   Hypersomnolence [id number]   Umbilical hernia without obstruction and without gangrene 10/14/2020   Elevated coronary artery calcium score 05/10/2020   B12 deficiency 05/10/2020   Vitamin D deficiency 05/10/2020   Hyperglycemia 05/10/2020   Encounter for well adult exam with abnormal findings 05/05/2019   Elevated blood pressure reading 05/05/2019   Mass of soft palate 12/08/2016   Dysphonia 09/17/2016   Laryngopharyngeal reflux (LPR) 09/17/2016   Mixed conductive and  sensorineural hearing loss of left ear 06/19/2016   Right-sided sensorineural hearing loss 06/19/2016   Arthralgia of left temporomandibular joint 01/24/2016   Otosclerosis 01/24/2016   Swelling, mass, or lump on face 01/17/2016   Rash of neck 11/19/2015   Rash of back 11/19/2015   Groin rash 11/19/2015   Chest pain 08/14/2015   Carpal tunnel syndrome 01/31/2015   Acute upper respiratory infection 03/15/2014   Right foot pain 11/01/2013   Eustachian tube dysfunction 10/03/2013   Anal pain 10/03/2013   Left lumbar radiculopathy 05/31/2013   Pruritus ani 11/08/2012   Referred otalgia, left 03/25/2010   HLD (hyperlipidemia) 09/25/2009   Anal fissure 09/25/2009   Hematochezia 09/25/2009   Anxiety state 10/06/2006   Hearing loss 10/06/2006   Allergic rhinitis 10/06/2006   Asthma 10/06/2006   GERD (gastroesophageal reflux disease) 10/06/2006   LOW BACK PAIN 10/06/2006    Past Surgical History:  Procedure Laterality Date   laser retinopexy Right 2015   STAPEDECTOMY     VASECTOMY         Home Medications    Prior to Admission medications   Medication Sig Start Date End Date Taking? Authorizing Provider  albuterol (PROAIR HFA) 108 (90 Base) MCG/ACT inhaler INHALE TWO PUFFS 4 TIMES DAILY AS NEEDED FOR SHORTNESS OF BREATH 04/23/20   Kozlow, Donnamarie Poag, MD  atorvastatin (LIPITOR) 10 MG tablet Take 1 tablet (10 mg  total) by mouth daily. 05/10/20 05/10/21  Biagio Borg, MD  Cholecalciferol (THERA-D 2000) 50 MCG (2000 UT) TABS 1 tab by mouth once daily 05/10/20   Biagio Borg, MD  EPINEPHrine (AUVI-Q) 0.3 mg/0.3 mL IJ SOAJ injection Inject 0.3 mg into the muscle as needed for anaphylaxis. 02/19/20   Kozlow, Donnamarie Poag, MD  fluticasone (FLONASE) 50 MCG/ACT nasal spray Place 2 sprays into both nostrils daily. 03/07/19   Kozlow, Donnamarie Poag, MD  gabapentin (NEURONTIN) 100 MG capsule Take 1 capsule (100 mg total) by mouth 3 (three) times daily. 05/10/20   Biagio Borg, MD  ketoconazole (NIZORAL) 2 % cream  Apply 1 application topically daily. 05/05/19   Biagio Borg, MD  lansoprazole (PREVACID) 30 MG capsule TAKE 1 TABLET BY MOUTH EVERY DAY 07/17/20   Kozlow, Donnamarie Poag, MD  levocetirizine (XYZAL) 5 MG tablet TAKE 1 TABLET BY MOUTH EVERY DAY IN THE EVENING AS NEEDED. 04/23/20   Kozlow, Donnamarie Poag, MD  meloxicam (MOBIC) 15 MG tablet TAKE ONE TABLET BY MOUTH ONCE DAILY AS NEEDED 03/02/19   Biagio Borg, MD  nystatin (MYCOSTATIN/NYSTOP) powder Use as directed twice per day to affected area as needed 11/19/15   Biagio Borg, MD  QVAR REDIHALER 80 MCG/ACT inhaler USE 2 PUFFS 1-2 TIMES PER DAY 03/18/20   Kozlow, Donnamarie Poag, MD  QVAR REDIHALER 80 MCG/ACT inhaler SMARTSIG:1-2 Puff(s) Via Inhaler Twice Daily 04/23/20   Kozlow, Donnamarie Poag, MD  vitamin B-12 (CYANOCOBALAMIN) 1000 MCG tablet Take 1 tablet (1,000 mcg total) by mouth daily. 05/06/19   Biagio Borg, MD    Family History Family History  Problem Relation Age of Onset   Hyperlipidemia Father    Other Mother        ibs   Hyperlipidemia Mother    Irritable bowel syndrome Maternal Aunt        x4   COPD Maternal Grandfather    Prostate cancer Maternal Grandfather    Colon polyps Maternal Grandfather    Allergies Other        children   Asthma Son    Heart failure Other        paternal great grandmother   Heart attack Maternal Grandmother    Diabetes Maternal Grandmother    Stroke Maternal Aunt    Diabetes Maternal Aunt    Ovarian cancer Paternal Grandmother    Diabetes Paternal Aunt    Allergic rhinitis Neg Hx    Angioedema Neg Hx    Atopy Neg Hx    Eczema Neg Hx    Immunodeficiency Neg Hx    Urticaria Neg Hx     Social History Social History   Tobacco Use   Smoking status: Never   Smokeless tobacco: Never  Vaping Use   Vaping Use: Never used  Substance Use Topics   Alcohol use: No   Drug use: No     Allergies   Beef (bovine) protein, Ciprofloxacin, Penicillins, Sulfa antibiotics, Tiotropium bromide monohydrate, Aspirin, Nabumetone, and  Sulfonamide derivatives   Review of Systems Review of Systems  Gastrointestinal:  Negative for constipation, diarrhea, nausea and rectal pain.       Umbilical hernia  All other systems reviewed and are negative.   Physical Exam Triage Vital Signs ED Triage Vitals  Enc Vitals Group     BP 10/22/20 1507 131/77     Pulse Rate 10/22/20 1507 87     Resp 10/22/20 1507 18     Temp 10/22/20 1507  98.4 F (36.9 C)     Temp Source 10/22/20 1507 Oral     SpO2 10/22/20 1507 99 %     Weight --      Height --      Head Circumference --      Peak Flow --      Pain Score 10/22/20 1506 1     Pain Loc --      Pain Edu? --      Excl. in [place]? --    No data found.  Updated Vital Signs BP 131/77 (BP Location: Left Arm)   Pulse 87   Temp 98.4 F (36.9 C) (Oral)   Resp 18   SpO2 99%   Visual Acuity Right Eye Distance:   Left Eye Distance:   Bilateral Distance:    Right Eye Near:   Left Eye Near:    Bilateral Near:     Physical Exam Constitutional:      Appearance: Normal appearance.  HENT:     Head: Normocephalic and atraumatic.  Cardiovascular:     Rate and Rhythm: Normal rate and regular rhythm.     Heart sounds: Normal heart sounds.  Pulmonary:     Effort: Pulmonary effort is normal.     Breath sounds: Normal breath sounds.  Abdominal:     General: Bowel sounds are normal.     Palpations: Abdomen is soft.     Tenderness: There is abdominal tenderness (Periumbilical). There is no right CVA tenderness, left CVA tenderness or guarding.     Hernia: A hernia (Umbilical, approximately 3 cm in diameter, bowels are palpable with Valsalva.) is present.  Musculoskeletal:        General: Normal range of motion.  Skin:    General: Skin is warm and dry.  Neurological:     General: No focal deficit present.     Mental Status: He is alert and oriented to person, place, and time.  Psychiatric:        Mood and Affect: Mood normal.        Behavior: Behavior normal.     UC  Treatments / Results  Labs (all labs ordered are listed, but only abnormal results are displayed) Labs Reviewed - No data to display  EKG   Radiology No results found.  Procedures Procedures (including critical care time)  Medications Ordered in UC Medications - No data to display  Initial Impression / Assessment and Plan / UC Course  I have reviewed the triage vital signs and the nursing notes.  Pertinent labs & imaging results that were available during my care of the patient were reviewed by me and considered in my medical decision making (see chart for details).     Patient advised that given the prolonged herniation of his bowels this afternoon, increasing pain and is concerned that the hernia is getting larger, he should have ultrasound performed of the hernia to evaluate the integrity of his bowels and did size of the opening.  Patient advised that he can follow-up with his primary care provider to have this ordered or, if he is more concerned and does not feel that he can wait 2 to 3 days to have this ordered, he can certainly go to the emergency room to have this done. Final Clinical Impressions(s) / UC Diagnoses   Final diagnoses:  Umbilical hernia without obstruction and without gangrene     Discharge Instructions      As you are aware, you definitely have an umbilical  hernia.  Because you are having pain at this point you do need imaging.  I recommend that you either follow-up with your primary care provider to have this ordered or if you are feeling concerned that you cannot wait 2 to 3 days for this appointment you can certainly go to the emergency room now.     ED Prescriptions   None    PDMP not reviewed this encounter.   Lynden Oxford Scales, PA-C 10/22/20 1540

## 2020-10-22 NOTE — ED Provider Notes (Signed)
Emergency Medicine Provider Triage Evaluation Note  Billy Morris , a 50 y.o. male  was evaluated in triage.  Pt complains of abd pain to the hernia site at his umbilicus.  Review of Systems  Positive: Abd pain Negative: Fever, nv  Physical Exam  BP 138/81 (BP Location: Left Arm)   Pulse 71   Temp 98.9 F (37.2 C) (Oral)   Resp 16   SpO2 99%  Gen:   Awake, no distress   Resp:  Normal effort  MSK:   Moves extremities without difficulty  Other:  Periumbilical ttp  Medical Decision Making  Medically screening exam initiated at 4:33 PM.  Appropriate orders placed.  [name] was informed that the remainder of the evaluation will be completed by another provider, this initial triage assessment does not replace that evaluation, and the importance of remaining in the ED until their evaluation is complete.     Tivis Ringer, Honest Vanleer S, PA-C [id number]    Gray, [place] P, DO [id number]

## 2020-10-22 NOTE — ED Triage Notes (Signed)
Pt here with c/o of abdominal pain d/t hernia since 1100 10/22/20. Denies N/V/F.

## 2020-10-22 NOTE — ED Triage Notes (Signed)
Pt reports having and umbilical hernia. P[t reports for the past 2 weeks when the bump pop out of the navel he pushes it back in place with his finger. Pt reports pain is different today and the bump is not out.

## 2020-10-23 ENCOUNTER — Emergency Department (HOSPITAL_COMMUNITY): Payer: Managed Care, Other (non HMO)

## 2020-10-23 MED ORDER — IOHEXOL 350 MG/ML SOLN
100.0000 mL | Freq: Once | INTRAVENOUS | Status: AC | PRN
  Administered 2020-10-23: 100 mL via INTRAVENOUS

## 2020-10-23 NOTE — ED Notes (Signed)
I introduced myself to pt. Pt AxO x4 with a GCS of 15. Pt had an umbilical hernia that popped out Saturday and he pushed back in himself. He feels like the hernia is about to pop out now so his PCP wants him to get it checked out because he's never had one. Pt denies pain. No hernia visible. Denies nausea/vomiting.

## 2020-10-23 NOTE — ED Provider Notes (Signed)
[facility] EMERGENCY DEPARTMENT Provider Note   CSN: [id number] Arrival date & time: 10/22/20  1542     History Chief Complaint  Patient presents with   Abdominal Pain    Billy Morris is a 50 y.o. male.  The history is provided by the patient and medical records.  Abdominal Pain  50 y.o. M with hx of anxiety, COPD, asthma, GERD, HLP, presenting to the ED for abdominal pain.  States he has had umbilical hernia for "a while" now but has been waiting to be referral to surgeon.  States today he has been having pain along the hernia and into the lower abdomen and it felt as though his hernia was "stuck".  States usually it just goes right back into the "hole" per patient.  States it has improved since earlier this AM and no longer feels like it is "stuck".  He denies vomiting or diarrhea.  No fever/chills.  No prior abdominal surgeries in the past.  [facility].  Past Medical History:  Diagnosis Date   Allergic rhinitis    Anal fissure    Anxiety    Asthma    COPD (chronic obstructive pulmonary disease) (HCC)    Diverticulosis    GERD (gastroesophageal reflux disease)    H. pylori infection    Hearing loss    chronic right   Hiatal hernia    Hyperlipidemia    LBP (low back pain)    TMJ (dislocation of temporomandibular joint)     Patient Active Problem List   Diagnosis Date Noted   Hypersomnolence [id number]   Umbilical hernia without obstruction and without gangrene 10/14/2020   Elevated coronary artery calcium score 05/10/2020   B12 deficiency 05/10/2020   Vitamin D deficiency 05/10/2020   Hyperglycemia 05/10/2020   Encounter for well adult exam with abnormal findings 05/05/2019   Elevated blood pressure reading 05/05/2019   Mass of soft palate 12/08/2016   Dysphonia 09/17/2016   Laryngopharyngeal reflux (LPR) 09/17/2016   Mixed conductive and sensorineural hearing loss of left ear 06/19/2016   Right-sided sensorineural hearing  loss 06/19/2016   Arthralgia of left temporomandibular joint 01/24/2016   Otosclerosis 01/24/2016   Swelling, mass, or lump on face 01/17/2016   Rash of neck 11/19/2015   Rash of back 11/19/2015   Groin rash 11/19/2015   Chest pain 08/14/2015   Carpal tunnel syndrome 01/31/2015   Acute upper respiratory infection 03/15/2014   Right foot pain 11/01/2013   Eustachian tube dysfunction 10/03/2013   Anal pain 10/03/2013   Left lumbar radiculopathy 05/31/2013   Pruritus ani 11/08/2012   Referred otalgia, left 03/25/2010   HLD (hyperlipidemia) 09/25/2009   Anal fissure 09/25/2009   Hematochezia 09/25/2009   Anxiety state 10/06/2006   Hearing loss 10/06/2006   Allergic rhinitis 10/06/2006   Asthma 10/06/2006   GERD (gastroesophageal reflux disease) 10/06/2006   LOW BACK PAIN 10/06/2006    Past Surgical History:  Procedure Laterality Date   laser retinopexy Right 2015   STAPEDECTOMY     VASECTOMY         Family History  Problem Relation Age of Onset   Hyperlipidemia Father    Other Mother        ibs   Hyperlipidemia Mother    Irritable bowel syndrome Maternal Aunt        x4   COPD Maternal Grandfather    Prostate cancer Maternal Grandfather    Colon polyps Maternal Grandfather  Allergies Other        children   Asthma Son    Heart failure Other        paternal great grandmother   Heart attack Maternal Grandmother    Diabetes Maternal Grandmother    Stroke Maternal Aunt    Diabetes Maternal Aunt    Ovarian cancer Paternal Grandmother    Diabetes Paternal Aunt    Allergic rhinitis Neg Hx    Angioedema Neg Hx    Atopy Neg Hx    Eczema Neg Hx    Immunodeficiency Neg Hx    Urticaria Neg Hx     Social History   Tobacco Use   Smoking status: Never   Smokeless tobacco: Never  Vaping Use   Vaping Use: Never used  Substance Use Topics   Alcohol use: No   Drug use: No    Home Medications Prior to Admission medications   Medication Sig Start Date End Date  Taking? Authorizing Provider  albuterol (PROAIR HFA) 108 (90 Base) MCG/ACT inhaler INHALE TWO PUFFS 4 TIMES DAILY AS NEEDED FOR SHORTNESS OF BREATH 04/23/20   Kozlow, Donnamarie Poag, MD  atorvastatin (LIPITOR) 10 MG tablet Take 1 tablet (10 mg total) by mouth daily. 05/10/20 05/10/21  Biagio Borg, MD  Cholecalciferol (THERA-D 2000) 50 MCG (2000 UT) TABS 1 tab by mouth once daily 05/10/20   Biagio Borg, MD  EPINEPHrine (AUVI-Q) 0.3 mg/0.3 mL IJ SOAJ injection Inject 0.3 mg into the muscle as needed for anaphylaxis. 02/19/20   Kozlow, Donnamarie Poag, MD  fluticasone (FLONASE) 50 MCG/ACT nasal spray Place 2 sprays into both nostrils daily. 03/07/19   Kozlow, Donnamarie Poag, MD  gabapentin (NEURONTIN) 100 MG capsule Take 1 capsule (100 mg total) by mouth 3 (three) times daily. 05/10/20   Biagio Borg, MD  ketoconazole (NIZORAL) 2 % cream Apply 1 application topically daily. 05/05/19   Biagio Borg, MD  lansoprazole (PREVACID) 30 MG capsule TAKE 1 TABLET BY MOUTH EVERY DAY 07/17/20   Kozlow, Donnamarie Poag, MD  levocetirizine (XYZAL) 5 MG tablet TAKE 1 TABLET BY MOUTH EVERY DAY IN THE EVENING AS NEEDED. 04/23/20   Kozlow, Donnamarie Poag, MD  meloxicam (MOBIC) 15 MG tablet TAKE ONE TABLET BY MOUTH ONCE DAILY AS NEEDED 03/02/19   Biagio Borg, MD  nystatin (MYCOSTATIN/NYSTOP) powder Use as directed twice per day to affected area as needed 11/19/15   Biagio Borg, MD  QVAR REDIHALER 80 MCG/ACT inhaler USE 2 PUFFS 1-2 TIMES PER DAY 03/18/20   Kozlow, Donnamarie Poag, MD  QVAR REDIHALER 80 MCG/ACT inhaler SMARTSIG:1-2 Puff(s) Via Inhaler Twice Daily 04/23/20   Kozlow, Donnamarie Poag, MD  vitamin B-12 (CYANOCOBALAMIN) 1000 MCG tablet Take 1 tablet (1,000 mcg total) by mouth daily. 05/06/19   Biagio Borg, MD    Allergies    Beef (bovine) protein, Ciprofloxacin, Penicillins, Sulfa antibiotics, Tiotropium bromide monohydrate, Aspirin, Nabumetone, and Sulfonamide derivatives  Review of Systems   Review of Systems  Gastrointestinal:  Positive for abdominal pain.  All other  systems reviewed and are negative.  Physical Exam Updated Vital Signs BP 113/82 (BP Location: Left Arm)   Pulse 65   Temp 98.6 F (37 C) (Oral)   Resp 16   Ht '5\' 6"'$  (1.676 m)   Wt 90 kg   SpO2 98%   BMI 32.02 kg/m   Physical Exam Vitals and nursing note reviewed.  Constitutional:      Appearance: He is well-developed.  HENT:  Head: Normocephalic and atraumatic.  Eyes:     Conjunctiva/sclera: Conjunctivae normal.     Pupils: Pupils are equal, round, and reactive to light.  Cardiovascular:     Rate and Rhythm: Normal rate and regular rhythm.     Heart sounds: Normal heart sounds.  Pulmonary:     Effort: Pulmonary effort is normal.     Breath sounds: Normal breath sounds.  Abdominal:     General: Bowel sounds are normal.     Palpations: Abdomen is soft.     Tenderness: There is no abdominal tenderness. There is no guarding or rebound.     Comments: Small umbilical hernia palpable, there is no overlying skin changes or induration, hernia feels fully reduced during present exam  Musculoskeletal:        General: Normal range of motion.     Cervical back: Normal range of motion.  Skin:    General: Skin is warm and dry.  Neurological:     Mental Status: He is alert and oriented to person, place, and time.    ED Results / Procedures / Treatments   Labs (all labs ordered are listed, but only abnormal results are displayed) Labs Reviewed  COMPREHENSIVE METABOLIC PANEL - Abnormal; Notable for the following components:      Result Value   Glucose, Bld 100 (*)    All other components within normal limits  CBC WITH DIFFERENTIAL/PLATELET  LIPASE, BLOOD  URINALYSIS, ROUTINE W REFLEX MICROSCOPIC    EKG None  Radiology CT ABDOMEN PELVIS W CONTRAST  Result Date: 10/23/2020 CLINICAL DATA:  Abdominal pain EXAM: CT ABDOMEN AND PELVIS WITH CONTRAST TECHNIQUE: Multidetector CT imaging of the abdomen and pelvis was performed using the standard protocol following bolus  administration of intravenous contrast. CONTRAST:  122m OMNIPAQUE IOHEXOL 350 MG/ML SOLN COMPARISON:  03/27/2016 FINDINGS: Lower chest: Lung bases are clear. Hepatobiliary: Liver is within normal limits. Gallbladder is notable for a 3 mm gallstone (series 3/image 31), without associated inflammatory changes. No intrahepatic or extrahepatic ductal dilatation. Pancreas: Within normal limits. Spleen: Within normal limits. Adrenals/Urinary Tract: Adrenal glands are within normal limits. Left kidney is notable for a subcentimeter cyst in the left lower pole (series 3/image 42). Right kidney is within normal limits. No hydronephrosis. Bladder is within normal limits. Stomach/Bowel: Stomach is within normal limits. No evidence of bowel obstruction. Normal appendix (series 3/image 44). Vascular/Lymphatic: No evidence of abdominal aortic aneurysm. No suspicious abdominopelvic lymphadenopathy. Reproductive: Prostate is notable for dystrophic calcifications. Other: No abdominopelvic ascites. Tiny fat containing periumbilical hernia (series 3/image 49). Musculoskeletal: Visualized osseous structures are within normal limits. IMPRESSION: 3 mm gallstone 10 without associated inflammatory changes. Tiny fat containing periumbilical hernia. No evidence of bowel obstruction.  Normal appendix. Electronically Signed   By: SJulian HyM.D.   On: 10/23/2020 01:55    Procedures Procedures   Medications Ordered in ED Medications - No data to display  ED Course  I have reviewed the triage vital signs and the nursing notes.  Pertinent labs & imaging results that were available during my care of the patient were reviewed by me and considered in my medical decision making (see chart for details).    MDM Rules/Calculators/A&P                           50y.o. M here with pain at umbilical hernia.  Seems better now than when it initially started.  [facility].  He is afebrile, non-toxic in  appearance.  Does have umbilical hernia on exam but this is soft without skin changes and appears fully reduced at present.  As he was having pain, CT obtained without acute findings.  He was reassured.  Will refer to general surgery for elective repair if desired.  Return here for new concerns.  Final Clinical Impression(s) / ED Diagnoses Final diagnoses:  Umbilical hernia without obstruction and without gangrene    Rx / DC Orders ED Discharge Orders     None        Larene Pickett, PA-C 10/23/20 [id number]    Orpah Greek, MD 10/23/20 [phone]

## 2020-10-23 NOTE — Discharge Instructions (Signed)
Can follow-up with general surgery to get hernia fixed.  Monitor for any redness, increased swelling, etc. Return here for new concerns.

## 2020-10-23 NOTE — ED Notes (Signed)
Patient transported to CT 

## 2020-10-23 NOTE — ED Notes (Signed)
Pt ambulatory at d/c. Steady on feet. VSS. GCS 15.

## 2020-10-29 ENCOUNTER — Encounter: Payer: Self-pay | Admitting: Allergy and Immunology

## 2020-10-29 ENCOUNTER — Other Ambulatory Visit: Payer: Self-pay

## 2020-10-29 ENCOUNTER — Ambulatory Visit: Payer: Managed Care, Other (non HMO) | Admitting: Allergy and Immunology

## 2020-10-29 VITALS — BP 130/82 | HR 74 | Temp 98.3°F | Ht 66.0 in | Wt 205.4 lb

## 2020-10-29 DIAGNOSIS — J4540 Moderate persistent asthma, uncomplicated: Secondary | ICD-10-CM

## 2020-10-29 DIAGNOSIS — J3089 Other allergic rhinitis: Secondary | ICD-10-CM | POA: Diagnosis not present

## 2020-10-29 DIAGNOSIS — K219 Gastro-esophageal reflux disease without esophagitis: Secondary | ICD-10-CM | POA: Diagnosis not present

## 2020-10-29 DIAGNOSIS — T7800XD Anaphylactic reaction due to unspecified food, subsequent encounter: Secondary | ICD-10-CM

## 2020-10-29 DIAGNOSIS — J301 Allergic rhinitis due to pollen: Secondary | ICD-10-CM

## 2020-10-29 DIAGNOSIS — L255 Unspecified contact dermatitis due to plants, except food: Secondary | ICD-10-CM

## 2020-10-29 MED ORDER — HALOBETASOL PROPIONATE 0.05 % EX CREA: TOPICAL_CREAM | Freq: Two times a day (BID) | CUTANEOUS | 0 refills | Status: DC

## 2020-10-29 NOTE — Patient Instructions (Addendum)
  1.  Continue to perform allergen avoidance measures - beef, dust mite, pollen, cat, dog, mold  2.  Continue to treat and prevent inflammation:   A.  Flonase - 1-2 sprays each nostril daily depending on disease activity  B.  Qvar 80 -2 inhalations 1-2 times per day depending on disease activity  3.  Continue to treat and prevent reflux:   A.  Prevacid 30 mg -1 tablet 1-2 times a day  B.  Continue to minimize caffeine and chocolate consumption  4. Treat contact dermatitis:   A. Ultravate ointment - 2 times a day until resolved  4.  If needed:   A.  Pro-air HFA -2 inhalations every 4-6 hours  B.  Xyzal 5 mg -1 tablet daily  C.  Auvi-Q 0.3, Benadryl, MD/ER evaluation for allergic reaction  5.  "Action Plan" for flare up:   A.  Increase Qvar to 3 inhalations 3 times per day  B.  Albuterol HFA if needed.   6.  Return to clinic in 6 months or earlier if problem  7.  Obtain fall flu vaccine

## 2020-10-29 NOTE — Progress Notes (Signed)
[place] [facility] [place] [facility] [place]   Follow-up Note  Referring Provider: Biagio Borg, MD Primary Provider: Biagio Borg, MD Date of Office Visit: 10/29/2020  Subjective:   Billy Morris (DOB: 02/02/1971) is a 50 y.o. male who returns to the [facility] on 10/29/2020 in re-evaluation of the following:  HPI: Maddoxx presents to this clinic in evaluation of asthma and allergic rhinitis and LPR and food allergy directed against mammal consumption.  His last visit to this clinic was 23 April 2020.  He actually had a very good spring and summer regarding his atopic respiratory disease while he continues to use a nasal steroid and an inhaled steroid on a consistent basis.  He did not require systemic steroid or antibiotic for any type of airway issue and rarely uses a short acting bronchodilator and can exert himself without any problem.  His reflux is under very good control and he has been able to consolidate his Prevacid to just 1 time per day.  He remains away from beef and dairy consumption but can apparently eat pork without any problem.  He is being evaluated for possible sleep apnea and has been referred to a sleep pulmonologist.  Recently he had exposure to poison ivy and has a persistent lesion on his lower extremity that his been in existence for about 3 weeks.  He has developed an umbilical hernia and plans on having this repaired sometime in the near future.  Allergies as of 10/29/2020       Reactions   Beef (bovine) Protein Hives   All red meat causes hives   Ciprofloxacin Other (See Comments), Diarrhea   Bloody stool Bloody stool Stools have bloody mucous   Penicillins Rash   Sulfa Antibiotics Rash   Tiotropium Bromide Monohydrate Other (See Comments)   heart racing and paresthesias   Aspirin    Other reaction(s): Other (See Comments)   Nabumetone    Other reaction(s): Other (See Comments) weakness weakness   Sulfonamide  Derivatives         Medication List    albuterol 108 (90 Base) MCG/ACT inhaler Commonly known as: ProAir HFA INHALE TWO PUFFS 4 TIMES DAILY AS NEEDED FOR SHORTNESS OF BREATH   atorvastatin 10 MG tablet Commonly known as: Lipitor Take 1 tablet (10 mg total) by mouth daily.   EPINEPHrine 0.3 mg/0.3 mL Soaj injection Commonly known as: Auvi-Q Inject 0.3 mg into the muscle as needed for anaphylaxis.   fluticasone 50 MCG/ACT nasal spray Commonly known as: FLONASE Place 2 sprays into both nostrils daily.   gabapentin 100 MG capsule Commonly known as: NEURONTIN Take 1 capsule (100 mg total) by mouth 3 (three) times daily.   ketoconazole 2 % cream Commonly known as: NIZORAL Apply 1 application topically daily.   lansoprazole 30 MG capsule Commonly known as: PREVACID TAKE 1 TABLET BY MOUTH EVERY DAY   levocetirizine 5 MG tablet Commonly known as: XYZAL TAKE 1 TABLET BY MOUTH EVERY DAY IN THE EVENING AS NEEDED.   meloxicam 15 MG tablet Commonly known as: MOBIC TAKE ONE TABLET BY MOUTH ONCE DAILY AS NEEDED   nystatin powder Commonly known as: MYCOSTATIN/NYSTOP Use as directed twice per day to affected area as needed   Qvar RediHaler 80 MCG/ACT inhaler Generic drug: beclomethasone SMARTSIG:1-2 Puff(s) Via Inhaler Twice Daily   VITAMIN D3 PO Take by mouth.   Thera-D 2000 50 MCG (2000 UT) Tabs Generic drug: Cholecalciferol 1 tab by mouth  once daily   vitamin B-12 1000 MCG tablet Commonly known as: CYANOCOBALAMIN Take 1 tablet (1,000 mcg total) by mouth daily.    Past Medical History:  Diagnosis Date   Allergic rhinitis    Anal fissure    Anxiety    Asthma    COPD (chronic obstructive pulmonary disease) (HCC)    Diverticulosis    GERD (gastroesophageal reflux disease)    H. pylori infection    Hearing loss    chronic right   Hiatal hernia    Hyperlipidemia    LBP (low back pain)    TMJ (dislocation of temporomandibular joint)     Past Surgical  History:  Procedure Laterality Date   laser retinopexy Right 2015   STAPEDECTOMY     VASECTOMY      Review of systems negative except as noted in HPI / PMHx or noted below:  Review of Systems  Constitutional: Negative.   HENT: Negative.    Eyes: Negative.   Respiratory: Negative.    Cardiovascular: Negative.   Gastrointestinal: Negative.   Genitourinary: Negative.   Musculoskeletal: Negative.   Skin: Negative.   Neurological: Negative.   Endo/Heme/Allergies: Negative.   Psychiatric/Behavioral: Negative.      Objective:   Vitals:   10/29/20 1651  BP: 130/82  Pulse: 74  Temp: 98.3 F (36.8 C)  SpO2: 99%   Height: 5\' 6"  (167.6 cm)  Weight: 205 lb 6.4 oz (93.2 kg)   Physical Exam Constitutional:      Appearance: He is not diaphoretic.  HENT:     Head: Normocephalic.     Right Ear: Tympanic membrane, ear canal and external ear normal.     Left Ear: Tympanic membrane, ear canal and external ear normal.     Nose: Nose normal. No mucosal edema or rhinorrhea.     Mouth/Throat:     Pharynx: Uvula midline. No oropharyngeal exudate.  Eyes:     Conjunctiva/sclera: Conjunctivae normal.  Neck:     Thyroid: No thyromegaly.     Trachea: Trachea normal. No tracheal tenderness or tracheal deviation.  Cardiovascular:     Rate and Rhythm: Normal rate and regular rhythm.     Heart sounds: Normal heart sounds, S1 normal and S2 normal. No murmur heard. Pulmonary:     Effort: No respiratory distress.     Breath sounds: Normal breath sounds. No stridor. No wheezing or rales.  Lymphadenopathy:     Head:     Right side of head: No tonsillar adenopathy.     Left side of head: No tonsillar adenopathy.     Cervical: No cervical adenopathy.  Skin:    Findings: Rash (Papular vesicular patches involving left lower extremity predominantly shin and knee) present. No erythema.     Nails: There is no clubbing.  Neurological:     Mental Status: He is alert.    Diagnostics:     Spirometry was performed and demonstrated an FEV1 of 2.36 at 68 % of predicted.  The patient had an Asthma Control Test with the following results: ACT Total Score: 25.    Assessment and Plan:   1. Asthma, moderate persistent, well-controlled   2. Perennial allergic rhinitis   3. Seasonal allergic rhinitis due to pollen   4. LPRD (laryngopharyngeal reflux disease)   5. Anaphylactic shock due to food, subsequent encounter   6. Contact dermatitis due to plant     1.  Continue to perform allergen avoidance measures - beef, dust mite, pollen, cat, dog,  mold  2.  Continue to treat and prevent inflammation:   A.  Flonase - 1-2 sprays each nostril daily depending on disease activity  B.  Qvar 80 -2 inhalations 1-2 times per day depending on disease activity  3.  Continue to treat and prevent reflux:   A.  Prevacid 30 mg -1 tablet 1-2 times a day  B.  Continue to minimize caffeine and chocolate consumption  4. Treat contact dermatitis:   A. Ultravate ointment - 2 times a day until resolved  4.  If needed:   A.  Pro-air HFA -2 inhalations every 4-6 hours  B.  Xyzal 5 mg -1 tablet daily  C.  Auvi-Q 0.3, Benadryl, MD/ER evaluation for allergic reaction  5.  "Action Plan" for flare up:   A.  Increase Qvar to 3 inhalations 3 times per day  B.  Albuterol HFA if needed.   6.  Return to clinic in 6 months or earlier if problem  7.  Obtain fall flu vaccine  Overall Dayna has been stable regarding his upper and lower respiratory tract issue while consistently using an nasal steroid and an inhaled steroid and being aware of allergen avoidance measures.  His reflux also appears to be under pretty good control at this point in time.  He has plant induced contact dermatitis we will start him on some Ultravate ointment.  Assuming he does well with this plan I will see him back in his clinic in 6 months or earlier if there is a problem.  Allena Katz, MD Allergy / Immunology [place]

## 2020-10-30 ENCOUNTER — Encounter: Payer: Self-pay | Admitting: Allergy and Immunology

## 2020-11-15 ENCOUNTER — Institutional Professional Consult (permissible substitution): Payer: Managed Care, Other (non HMO) | Admitting: Pulmonary Disease

## 2021-03-05 ENCOUNTER — Other Ambulatory Visit: Payer: Self-pay | Admitting: Allergy and Immunology

## 2021-05-16 ENCOUNTER — Encounter: Payer: Managed Care, Other (non HMO) | Admitting: Internal Medicine

## 2021-05-19 ENCOUNTER — Encounter: Payer: Self-pay | Admitting: Internal Medicine

## 2021-05-19 DIAGNOSIS — Z0001 Encounter for general adult medical examination with abnormal findings: Secondary | ICD-10-CM

## 2021-05-19 DIAGNOSIS — E559 Vitamin D deficiency, unspecified: Secondary | ICD-10-CM

## 2021-05-19 DIAGNOSIS — E7800 Pure hypercholesterolemia, unspecified: Secondary | ICD-10-CM

## 2021-05-19 DIAGNOSIS — E538 Deficiency of other specified B group vitamins: Secondary | ICD-10-CM

## 2021-05-19 DIAGNOSIS — R739 Hyperglycemia, unspecified: Secondary | ICD-10-CM

## 2021-05-23 ENCOUNTER — Other Ambulatory Visit (INDEPENDENT_AMBULATORY_CARE_PROVIDER_SITE_OTHER): Payer: Managed Care, Other (non HMO)

## 2021-05-23 ENCOUNTER — Encounter: Payer: Managed Care, Other (non HMO) | Admitting: Internal Medicine

## 2021-05-23 DIAGNOSIS — R739 Hyperglycemia, unspecified: Secondary | ICD-10-CM | POA: Diagnosis not present

## 2021-05-23 DIAGNOSIS — Z0001 Encounter for general adult medical examination with abnormal findings: Secondary | ICD-10-CM

## 2021-05-23 DIAGNOSIS — E7800 Pure hypercholesterolemia, unspecified: Secondary | ICD-10-CM

## 2021-05-23 DIAGNOSIS — E538 Deficiency of other specified B group vitamins: Secondary | ICD-10-CM

## 2021-05-23 DIAGNOSIS — E559 Vitamin D deficiency, unspecified: Secondary | ICD-10-CM | POA: Diagnosis not present

## 2021-05-23 LAB — CBC WITH DIFFERENTIAL/PLATELET
Basophils Absolute: 0 10*3/uL (ref 0.0–0.1)
Basophils Relative: 0.9 % (ref 0.0–3.0)
Eosinophils Absolute: 0.1 10*3/uL (ref 0.0–0.7)
Eosinophils Relative: 1.6 % (ref 0.0–5.0)
HCT: 39.9 % (ref 39.0–52.0)
Hemoglobin: 13.7 g/dL (ref 13.0–17.0)
Lymphocytes Relative: 36.4 % (ref 12.0–46.0)
Lymphs Abs: 1.8 10*3/uL (ref 0.7–4.0)
MCHC: 34.4 g/dL (ref 30.0–36.0)
MCV: 92.4 fl (ref 78.0–100.0)
Monocytes Absolute: 0.5 10*3/uL (ref 0.1–1.0)
Monocytes Relative: 9.6 % (ref 3.0–12.0)
Neutro Abs: 2.6 10*3/uL (ref 1.4–7.7)
Neutrophils Relative %: 51.5 % (ref 43.0–77.0)
Platelets: 217 10*3/uL (ref 150.0–400.0)
RBC: 4.32 Mil/uL (ref 4.22–5.81)
RDW: 12.7 % (ref 11.5–15.5)
WBC: 5 10*3/uL (ref 4.0–10.5)

## 2021-05-23 LAB — URINALYSIS, ROUTINE W REFLEX MICROSCOPIC
Bilirubin Urine: NEGATIVE
Hgb urine dipstick: NEGATIVE
Ketones, ur: NEGATIVE
Leukocytes,Ua: NEGATIVE
Nitrite: NEGATIVE
RBC / HPF: NONE SEEN (ref 0–?)
Specific Gravity, Urine: >=1.03 — AB (ref 1.000–1.030)
Total Protein, Urine: NEGATIVE
Urine Glucose: NEGATIVE
Urobilinogen, UA: 0.2 (ref 0.0–1.0)
pH: 5.5 (ref 5.0–8.0)

## 2021-05-23 LAB — TSH: TSH: 1.58 u[IU]/mL (ref 0.35–5.50)

## 2021-05-23 LAB — BASIC METABOLIC PANEL
BUN: 21 mg/dL (ref 6–23)
CO2: 25 mEq/L (ref 19–32)
Calcium: 9.1 mg/dL (ref 8.4–10.5)
Chloride: 104 mEq/L (ref 96–112)
Creatinine, Ser: 0.84 mg/dL (ref 0.40–1.50)
GFR: 101.45 mL/min (ref >60.00)
Glucose, Bld: 81 mg/dL (ref 70–99)
Potassium: 4.1 mEq/L (ref 3.5–5.1)
Sodium: 138 mEq/L (ref 135–145)

## 2021-05-23 LAB — HEPATIC FUNCTION PANEL
ALT: 23 U/L (ref 0–53)
AST: 17 U/L (ref 0–37)
Albumin: 4.4 g/dL (ref 3.5–5.2)
Alkaline Phosphatase: 58 U/L (ref 39–117)
Bilirubin, Direct: 0.2 mg/dL (ref 0.0–0.3)
Total Bilirubin: 1 mg/dL (ref 0.2–1.2)
Total Protein: 6.5 g/dL (ref 6.0–8.3)

## 2021-05-23 LAB — LIPID PANEL
Cholesterol: 146 mg/dL (ref 0–200)
HDL: 59 mg/dL (ref >39.00)
LDL Cholesterol: 76 mg/dL (ref 0–99)
NonHDL: 86.92
Total CHOL/HDL Ratio: 2
Triglycerides: 54 mg/dL (ref 0.0–149.0)
VLDL: 10.8 mg/dL (ref 0.0–40.0)

## 2021-05-23 LAB — VITAMIN D 25 HYDROXY (VIT D DEFICIENCY, FRACTURES): VITD: 30.1 ng/mL (ref 30.00–100.00)

## 2021-05-23 LAB — HEMOGLOBIN A1C: Hgb A1c MFr Bld: 5.5 % (ref 4.6–6.5)

## 2021-05-23 LAB — VITAMIN B12: Vitamin B-12: 464 pg/mL (ref 211–911)

## 2021-05-23 LAB — PSA: PSA: 0.27 ng/mL (ref 0.10–4.00)

## 2021-06-06 ENCOUNTER — Ambulatory Visit (INDEPENDENT_AMBULATORY_CARE_PROVIDER_SITE_OTHER): Payer: Managed Care, Other (non HMO) | Admitting: Internal Medicine

## 2021-06-06 ENCOUNTER — Encounter: Payer: Self-pay | Admitting: Internal Medicine

## 2021-06-06 VITALS — BP 122/74 | HR 68 | Ht 66.0 in | Wt 196.0 lb

## 2021-06-06 DIAGNOSIS — E7800 Pure hypercholesterolemia, unspecified: Secondary | ICD-10-CM

## 2021-06-06 DIAGNOSIS — E669 Obesity, unspecified: Secondary | ICD-10-CM

## 2021-06-06 DIAGNOSIS — E66811 Obesity, class 1: Secondary | ICD-10-CM

## 2021-06-06 DIAGNOSIS — R739 Hyperglycemia, unspecified: Secondary | ICD-10-CM

## 2021-06-06 DIAGNOSIS — E538 Deficiency of other specified B group vitamins: Secondary | ICD-10-CM

## 2021-06-06 DIAGNOSIS — Z0001 Encounter for general adult medical examination with abnormal findings: Secondary | ICD-10-CM | POA: Diagnosis not present

## 2021-06-06 DIAGNOSIS — K429 Umbilical hernia without obstruction or gangrene: Secondary | ICD-10-CM

## 2021-06-06 DIAGNOSIS — E559 Vitamin D deficiency, unspecified: Secondary | ICD-10-CM

## 2021-06-06 NOTE — Patient Instructions (Addendum)
Please check if your employer will cover Wegovy shots for wt loss ? ?Please have the Shingles shot at the pharmacy if ok with the insurance ? ?Please take OTC Vitamin D3 at 2000 units per day, indefinitely ? ?Please continue all other medications as before, and refills have been done if requested. ? ?Please have the pharmacy call with any other refills you may need. ? ?Please continue your efforts at being more active, low cholesterol diet, and weight control. ? ?You are otherwise up to date with prevention measures today. ? ?Please keep your appointments with your specialists as you may have planned ? ?Please make an Appointment to return for your 1 year visit, or sooner if needed, with Lab testing by Appointment as well, to be done about 3-5 days before at the [facility] (so this is for TWO appointments - please see the scheduling desk as you leave) ? ? ?Due to the ongoing Covid 19 pandemic, our lab now requires an appointment for any labs done at our office.  If you need labs done and do not have an appointment, please call our office ahead of time to schedule before presenting to the lab for your testing. ? ?

## 2021-06-06 NOTE — Progress Notes (Signed)
Patient ID: Billy Morris, male   DOB: Mar 20, 1970, 51 y.o.   MRN: [id number] ? ? ? ?     Chief Complaint:: wellness exam and umbilical hernia, low vit d, left renal cyst, gallstone, anxiety, obesity ? ?     HPI:  Billy Morris is a 51 y.o. male here for wellness exam; declines shingrix and covid booster, o/w up to date ?         ?              Also would consider wegovy if covered by insurance, cannot seem to lose wt with gym several times per wk.  Pt denies chest pain, increased sob or doe, wheezing, orthopnea, PND, increased LE swelling, palpitations, dizziness or syncope.   Pt denies polydipsia, polyuria, or new focal neuro s/s.    Pt denies fever, wt loss, night sweats, loss of appetite, or other constitutional symptoms  No new complaints  Now taking Vit d 1000 only.  Has ongoing umbilical hernia with mild intermittent symptoms, none today, declines referral to general surgury for now.  Recent CT with non incarcerated hernia, simple renal cyst, and very small gallstone.  ?  ?Wt Readings from Last 3 Encounters:  ?06/06/21 196 lb (88.9 kg)  ?10/29/20 205 lb 6.4 oz (93.2 kg)  ?10/22/20 198 lb 6.6 oz (90 kg)  ? ?BP Readings from Last 3 Encounters:  ?06/06/21 122/74  ?10/29/20 130/82  ?10/23/20 113/67  ? ?Immunization History  ?Administered Date(s) Administered  ? Influenza Split 11/05/2011  ? Influenza, Quadrivalent, Recombinant, Inj, Pf 11/25/2018  ? Influenza,inj,Quad PF,6+ Mos 11/08/2012, 10/03/2013, 01/31/2015, 12/08/2016, 11/09/2017, 12/17/2017  ? PFIZER(Purple Top)SARS-COV-2 Vaccination 04/14/2019, 05/16/2019  ? Td 02/09/2005, 04/15/2010  ? Tdap 05/10/2020  ? ?There are no preventive care reminders to display for this patient. ? ?  ? ?Past Medical History:  ?Diagnosis Date  ? Allergic rhinitis   ? Anal fissure   ? Anxiety   ? Asthma   ? COPD (chronic obstructive pulmonary disease) ([place])   ? Diverticulosis   ? GERD (gastroesophageal reflux disease)   ? H. pylori infection   ? Hearing loss   ? chronic right  ? Hiatal  hernia   ? Hyperlipidemia   ? LBP (low back pain)   ? TMJ (dislocation of temporomandibular joint)   ? ?Past Surgical History:  ?Procedure Laterality Date  ? laser retinopexy Right 2015  ? STAPEDECTOMY    ? VASECTOMY    ? ? reports that he has never smoked. He has never used smokeless tobacco. He reports that he does not drink alcohol and does not use drugs. ?family history includes Allergies in an other family member; Asthma in his son; COPD in his maternal grandfather; Colon polyps in his maternal grandfather; Diabetes in his maternal aunt, maternal grandmother, and paternal aunt; Heart attack in his maternal grandmother; Heart failure in an other family member; Hyperlipidemia in his father and mother; Irritable bowel syndrome in his maternal aunt; Other in his mother; Ovarian cancer in his paternal grandmother; Prostate cancer in his maternal grandfather; Stroke in his maternal aunt. ?Allergies  ?Allergen Reactions  ? Beef (Bovine) Protein Hives  ?  All red meat causes hives  ? Ciprofloxacin Other (See Comments) and Diarrhea  ?  Bloody stool ?Bloody stool ?Stools have bloody mucous  ? Penicillins Rash  ? Sulfa Antibiotics Rash  ? Tiotropium Bromide Monohydrate Other (See Comments)  ?  heart racing and paresthesias  ? Aspirin   ?  Other reaction(s): Other (See Comments)  ? Nabumetone   ?  Other reaction(s): Other (See Comments) ?weakness ?weakness  ? Sulfonamide Derivatives   ? ?Current Outpatient Medications on File Prior to Visit  ?Medication Sig Dispense Refill  ? albuterol (PROAIR HFA) 108 (90 Base) MCG/ACT inhaler INHALE TWO PUFFS 4 TIMES DAILY AS NEEDED FOR SHORTNESS OF BREATH 18 g 2  ? Cholecalciferol (THERA-D 2000) 50 MCG (2000 UT) TABS 1 tab by mouth once daily 30 tablet 99  ? Cholecalciferol (VITAMIN D3 PO) Take by mouth.    ? EPINEPHrine (AUVI-Q) 0.3 mg/0.3 mL IJ SOAJ injection Inject 0.3 mg into the muscle as needed for anaphylaxis. 1 each 0  ? fluticasone (FLONASE) 50 MCG/ACT nasal spray Place 2  sprays into both nostrils daily. 16 g 5  ? gabapentin (NEURONTIN) 100 MG capsule Take 1 capsule (100 mg total) by mouth 3 (three) times daily. 90 capsule 5  ? halobetasol (ULTRAVATE) 0.05 % cream Apply topically 2 (two) times daily. 50 g 0  ? ketoconazole (NIZORAL) 2 % cream Apply 1 application topically daily. 30 g 1  ? lansoprazole (PREVACID) 30 MG capsule TAKE 1 TABLET BY MOUTH EVERY DAY 30 capsule 0  ? levocetirizine (XYZAL) 5 MG tablet TAKE 1 TABLET BY MOUTH EVERY DAY IN THE EVENING AS NEEDED. 30 tablet 5  ? meloxicam (MOBIC) 15 MG tablet TAKE ONE TABLET BY MOUTH ONCE DAILY AS NEEDED 30 tablet 0  ? nystatin (MYCOSTATIN/NYSTOP) powder Use as directed twice per day to affected area as needed 30 g 2  ? QVAR REDIHALER 80 MCG/ACT inhaler INHALE 1 TO 2 PUFFS BY MOUTH TWICE A DAY 10.6 g 4  ? vitamin B-12 (CYANOCOBALAMIN) 1000 MCG tablet Take 1 tablet (1,000 mcg total) by mouth daily. 90 tablet 3  ? atorvastatin (LIPITOR) 10 MG tablet Take 1 tablet (10 mg total) by mouth daily. 90 tablet 3  ? ?No current facility-administered medications on file prior to visit.  ? ?     ROS:  All others reviewed and negative. ? ?Objective  ? ?     PE:  BP 122/74 (BP Location: Right Arm, Patient Position: Sitting, Cuff Size: Large)   Pulse 68   Ht '5\' 6"'$  (1.676 m)   Wt 196 lb (88.9 kg)   SpO2 98%   BMI 31.64 kg/m?  ? ?              Constitutional: Pt appears in NAD ?              HENT: Head: NCAT.  ?              Right Ear: External ear normal.   ?              Left Ear: External ear normal.  ?              Eyes: . Pupils are equal, round, and reactive to light. Conjunctivae and EOM are normal ?              Nose: without d/c or deformity ?              Neck: Neck supple. Gross normal ROM ?              Cardiovascular: Normal rate and regular rhythm.   ?              Pulmonary/Chest: Effort normal and breath sounds without rales or wheezing.  ?  Abd:  Soft, NT, ND, + BS, no organomegaly, small non tender reducible  umbilical hernia noted ?              Neurological: Pt is alert. At baseline orientation, motor grossly intact ?              Skin: Skin is warm. No rashes, no other new lesions, LE edema - none ?              Psychiatric: Pt behavior is normal without agitation  ? ?Micro: none ? ?Cardiac tracings I have personally interpreted today:  none ? ?Pertinent Radiological findings (summarize): none  ? ?Lab Results  ?Component Value Date  ? WBC 5.0 05/23/2021  ? HGB 13.7 05/23/2021  ? HCT 39.9 05/23/2021  ? PLT 217.0 05/23/2021  ? GLUCOSE 81 05/23/2021  ? CHOL 146 05/23/2021  ? TRIG 54.0 05/23/2021  ? HDL 59.00 05/23/2021  ? [place] 76 05/23/2021  ? ALT 23 05/23/2021  ? AST 17 05/23/2021  ? NA 138 05/23/2021  ? K 4.1 05/23/2021  ? CL 104 05/23/2021  ? CREATININE 0.84 05/23/2021  ? BUN 21 05/23/2021  ? CO2 25 05/23/2021  ? TSH 1.58 05/23/2021  ? PSA 0.27 05/23/2021  ? HGBA1C 5.5 05/23/2021  ? ?Assessment/Plan:  ?Billy Morris is a 51 y.o. White or Caucasian [1] male with  has a past medical history of Allergic rhinitis, Anal fissure, Anxiety, Asthma, COPD (chronic obstructive pulmonary disease) ([place]), Diverticulosis, GERD (gastroesophageal reflux disease), H. pylori infection, Hearing loss, Hiatal hernia, Hyperlipidemia, LBP (low back pain), and TMJ (dislocation of temporomandibular joint). ? ?Encounter for well adult exam with abnormal findings ?Age and sex appropriate education and counseling updated with regular exercise and diet ?Referrals for preventative services - none needed ?Immunizations addressed - declines shingrix, and covid booster ?Smoking counseling  - none needed ?Evidence for depression or other mood disorder - none significant ?Most recent labs reviewed. ?I have personally reviewed and have noted: ?1) the patient's medical and social history ?2) The patient's current medications and supplements ?3) The patient's height, weight, and BMI have been recorded in the chart ? ? ?HLD (hyperlipidemia) ?Lab Results   ?Component Value Date  ? [place] 76 05/23/2021  ? ?Mild uncontrolled, goal ldl < 70 pt to continue current statin lipitor 10 as declines change for now ? ? ?B12 deficiency ?Lab Results  ?Component Value Date  ? VITAMI

## 2021-06-07 NOTE — Addendum Note (Signed)
Addended by: Biagio Borg on: 06/07/2021 02:05 PM ? ? Modules accepted: Orders ? ?

## 2021-06-07 NOTE — Assessment & Plan Note (Signed)
Mild intermittent symptoms, but declines general surgury referal for now, pt to call if changes his mind ?

## 2021-06-07 NOTE — Assessment & Plan Note (Signed)
Pt to consider wegovy - declines for now ?

## 2021-06-07 NOTE — Assessment & Plan Note (Signed)
Age and sex appropriate education and counseling updated with regular exercise and diet ?Referrals for preventative services - none needed ?Immunizations addressed - declines shingrix, and covid booster ?Smoking counseling  - none needed ?Evidence for depression or other mood disorder - none significant ?Most recent labs reviewed. ?I have personally reviewed and have noted: ?1) the patient's medical and social history ?2) The patient's current medications and supplements ?3) The patient's height, weight, and BMI have been recorded in the chart ? ?

## 2021-06-07 NOTE — Assessment & Plan Note (Signed)
Lab Results  ?Component Value Date  ? [place] 76 05/23/2021  ? ?Mild uncontrolled, goal ldl < 70 pt to continue current statin lipitor 10 as declines change for now ? ?

## 2021-06-07 NOTE — Assessment & Plan Note (Signed)
Lab Results  ?Component Value Date  ? HGBA1C 5.5 05/23/2021  ? ?Stable, pt to continue current medical treatment  - diet ? ?

## 2021-06-07 NOTE — Assessment & Plan Note (Signed)
Last vitamin D Lab Results  Component Value Date   VD25OH 30.10 05/23/2021   Low, to start oral replacement  

## 2021-06-07 NOTE — Assessment & Plan Note (Signed)
Lab Results  ?Component Value Date  ? [id number] 05/23/2021  ? ?Stable, cont oral replacement - b12 1000 mcg qd ? ?

## 2021-07-05 ENCOUNTER — Other Ambulatory Visit: Payer: Self-pay | Admitting: Internal Medicine

## 2021-07-05 NOTE — Telephone Encounter (Signed)
Please refill as per office routine med refill policy (all routine meds to be refilled for 3 mo or monthly (per pt preference) up to one year from last visit, then month to month grace period for 3 mo, then further med refills will have to be denied) ? ?

## 2021-10-30 ENCOUNTER — Other Ambulatory Visit: Payer: Self-pay | Admitting: Allergy and Immunology

## 2021-12-08 ENCOUNTER — Encounter: Payer: Self-pay | Admitting: Internal Medicine

## 2021-12-17 ENCOUNTER — Ambulatory Visit: Payer: Managed Care, Other (non HMO) | Admitting: Internal Medicine

## 2022-01-03 ENCOUNTER — Other Ambulatory Visit: Payer: Self-pay | Admitting: Allergy and Immunology

## 2022-01-11 ENCOUNTER — Other Ambulatory Visit: Payer: Self-pay | Admitting: Allergy and Immunology

## 2022-01-14 ENCOUNTER — Ambulatory Visit: Payer: Managed Care, Other (non HMO) | Admitting: Internal Medicine

## 2022-01-14 ENCOUNTER — Encounter: Payer: Self-pay | Admitting: Internal Medicine

## 2022-01-14 VITALS — BP 136/82 | HR 85 | Temp 98.2°F | Ht 66.0 in | Wt 206.0 lb

## 2022-01-14 DIAGNOSIS — R0789 Other chest pain: Secondary | ICD-10-CM

## 2022-01-14 DIAGNOSIS — I2510 Atherosclerotic heart disease of native coronary artery without angina pectoris: Secondary | ICD-10-CM

## 2022-01-14 DIAGNOSIS — Z23 Encounter for immunization: Secondary | ICD-10-CM | POA: Diagnosis not present

## 2022-01-14 DIAGNOSIS — I2583 Coronary atherosclerosis due to lipid rich plaque: Secondary | ICD-10-CM | POA: Diagnosis not present

## 2022-01-14 NOTE — Assessment & Plan Note (Addendum)
Likely MSK pain 2 episodes of sharp pain in the chest <1 second long. No SOB, sweats, DOE. 2021: Coronary calcium CT score of 50. Calcium is a single focus in proximal LAD. EKG CXR  01/2022 Cardiology referral suggested - pt declined To ER if problems

## 2022-01-14 NOTE — Patient Instructions (Signed)
Cardiac CT calcium scoring test --  Calcium Score  Presence of CAD (coronary artery disease)  0 No evidence of CAD   1-10 Minimal evidence of CAD  11-100 Mild evidence of CAD  101-400 Moderate evidence of CAD  Over 400 Extensive evidence of CAD

## 2022-01-14 NOTE — Assessment & Plan Note (Addendum)
2021: Coronary calcium CT score of 50. Calcium is a single focus in proximal LAD. On Lipitor No angina - physical work Cardiology referral suggested - pt declined To ER if problems

## 2022-01-14 NOTE — Progress Notes (Signed)
Subjective:  Patient ID: Billy Morris, male    DOB: [birth year]  Age: 51 y.o. MRN: [id number]  CC: Pain (Was moving a heavy grill and had a sharp chest pain , pain in back muscle )   HPI [name] presents for CP episode on Monday evening at 6 pm - he was moving heavy grill/smoker parts: sharp pain in the chest <1 second. No SOB, sweats, DOE.   Same thing happened on Tue  C/o tightness in the back muscles. No pain today.   Outpatient Medications Prior to Visit  Medication Sig Dispense Refill   albuterol (PROAIR HFA) 108 (90 Base) MCG/ACT inhaler INHALE TWO PUFFS 4 TIMES DAILY AS NEEDED FOR SHORTNESS OF BREATH 18 g 2   atorvastatin (LIPITOR) 10 MG tablet TAKE 1 TABLET BY MOUTH EVERY DAY 90 tablet 2   Cholecalciferol (THERA-D 2000) 50 MCG (2000 UT) TABS 1 tab by mouth once daily 30 tablet 99   Cholecalciferol (VITAMIN D3 PO) Take by mouth.     EPINEPHrine (AUVI-Q) 0.3 mg/0.3 mL IJ SOAJ injection Inject 0.3 mg into the muscle as needed for anaphylaxis. 1 each 0   fluticasone (FLONASE) 50 MCG/ACT nasal spray Place 2 sprays into both nostrils daily. 16 g 5   gabapentin (NEURONTIN) 100 MG capsule Take 1 capsule (100 mg total) by mouth 3 (three) times daily. 90 capsule 5   halobetasol (ULTRAVATE) 0.05 % cream Apply topically 2 (two) times daily. 50 g 0   ketoconazole (NIZORAL) 2 % cream Apply 1 application topically daily. 30 g 1   lansoprazole (PREVACID) 30 MG capsule TAKE 1 TABLET BY MOUTH EVERY DAY 30 capsule 0   levocetirizine (XYZAL) 5 MG tablet TAKE 1 TABLET BY MOUTH EVERY DAY IN THE EVENING AS NEEDED. 30 tablet 5   meloxicam (MOBIC) 15 MG tablet TAKE ONE TABLET BY MOUTH ONCE DAILY AS NEEDED 30 tablet 0   nystatin (MYCOSTATIN/NYSTOP) powder Use as directed twice per day to affected area as needed 30 g 2   vitamin B-12 (CYANOCOBALAMIN) 1000 MCG tablet Take 1 tablet (1,000 mcg total) by mouth daily. 90 tablet 3   citalopram (CELEXA) 20 MG tablet Take by mouth.     QVAR REDIHALER 80  MCG/ACT inhaler INHALE 1 TO 2 PUFFS BY MOUTH TWICE A DAY 10.6 g 4   No facility-administered medications prior to visit.    ROS: Review of Systems  Constitutional:  Negative for appetite change, fatigue and unexpected weight change.  HENT:  Negative for congestion, nosebleeds, sneezing, sore throat and trouble swallowing.   Eyes:  Negative for itching and visual disturbance.  Respiratory:  Negative for cough.   Cardiovascular:  Negative for chest pain, palpitations and leg swelling.  Gastrointestinal:  Negative for abdominal distention, blood in stool, diarrhea and nausea.  Genitourinary:  Negative for frequency and hematuria.  Musculoskeletal:  Negative for back pain, gait problem, joint swelling and neck pain.  Skin:  Negative for pallor and rash.  Neurological:  Negative for dizziness, tremors, speech difficulty and weakness.  Hematological:  Negative for adenopathy.  Psychiatric/Behavioral:  Negative for agitation, dysphoric mood and sleep disturbance. The patient is not nervous/anxious.     Objective:  BP 136/82 (BP Location: Left Arm, Patient Position: Sitting, Cuff Size: Normal)   Pulse 85   Temp 98.2 F (36.8 C) (Oral)   Ht '5\' 6"'$  (1.676 m)   Wt 206 lb (93.4 kg)   SpO2 98%   BMI 33.25 kg/m   BP Readings  from Last 3 Encounters:  01/14/22 136/82  06/06/21 122/74  10/29/20 130/82    Wt Readings from Last 3 Encounters:  01/14/22 206 lb (93.4 kg)  06/06/21 196 lb (88.9 kg)  10/29/20 205 lb 6.4 oz (93.2 kg)    Physical Exam Constitutional:      General: He is not in acute distress.    Appearance: He is well-developed.     Comments: NAD  Eyes:     Conjunctiva/sclera: Conjunctivae normal.     Pupils: Pupils are equal, round, and reactive to light.  Neck:     Thyroid: No thyromegaly.     Vascular: No JVD.  Cardiovascular:     Rate and Rhythm: Normal rate and regular rhythm.     Heart sounds: Normal heart sounds. No murmur heard.    No friction rub. No gallop.   Pulmonary:     Effort: Pulmonary effort is normal. No respiratory distress.     Breath sounds: Normal breath sounds. No wheezing or rales.  Chest:     Chest wall: No tenderness.  Abdominal:     General: Bowel sounds are normal. There is no distension.     Palpations: Abdomen is soft. There is no mass.     Tenderness: There is no abdominal tenderness. There is no guarding or rebound.  Musculoskeletal:        General: No tenderness. Normal range of motion.     Cervical back: Normal range of motion.  Lymphadenopathy:     Cervical: No cervical adenopathy.  Skin:    General: Skin is warm and dry.     Findings: No rash.  Neurological:     Mental Status: He is alert and oriented to person, place, and time.     Cranial Nerves: No cranial nerve deficit.     Motor: No abnormal muscle tone.     Coordination: Coordination normal.     Gait: Gait normal.     Deep Tendon Reflexes: Reflexes are normal and symmetric.  Psychiatric:        Behavior: Behavior normal.        Thought Content: Thought content normal.        Judgment: Judgment normal.   Chest wall NT   Procedure: EKG Indication: chest pain Impression: NSR. No acute changes.  Lab Results  Component Value Date   WBC 5.0 05/23/2021   HGB 13.7 05/23/2021   HCT 39.9 05/23/2021   PLT 217.0 05/23/2021   GLUCOSE 81 05/23/2021   CHOL 146 05/23/2021   TRIG 54.0 05/23/2021   HDL 59.00 05/23/2021   LDLCALC 76 05/23/2021   ALT 23 05/23/2021   AST 17 05/23/2021   NA 138 05/23/2021   K 4.1 05/23/2021   CL 104 05/23/2021   CREATININE 0.84 05/23/2021   BUN 21 05/23/2021   CO2 25 05/23/2021   TSH 1.58 05/23/2021   PSA 0.27 05/23/2021   HGBA1C 5.5 05/23/2021    CT ABDOMEN PELVIS W CONTRAST  Result Date: 10/23/2020 CLINICAL DATA:  Abdominal pain EXAM: CT ABDOMEN AND PELVIS WITH CONTRAST TECHNIQUE: Multidetector CT imaging of the abdomen and pelvis was performed using the standard protocol following bolus administration of  intravenous contrast. CONTRAST:  118m OMNIPAQUE IOHEXOL 350 MG/ML SOLN COMPARISON:  03/27/2016 FINDINGS: Lower chest: Lung bases are clear. Hepatobiliary: Liver is within normal limits. Gallbladder is notable for a 3 mm gallstone (series 3/image 31), without associated inflammatory changes. No intrahepatic or extrahepatic ductal dilatation. Pancreas: Within normal limits. Spleen: Within normal limits.  Adrenals/Urinary Tract: Adrenal glands are within normal limits. Left kidney is notable for a subcentimeter cyst in the left lower pole (series 3/image 42). Right kidney is within normal limits. No hydronephrosis. Bladder is within normal limits. Stomach/Bowel: Stomach is within normal limits. No evidence of bowel obstruction. Normal appendix (series 3/image 44). Vascular/Lymphatic: No evidence of abdominal aortic aneurysm. No suspicious abdominopelvic lymphadenopathy. Reproductive: Prostate is notable for dystrophic calcifications. Other: No abdominopelvic ascites. Tiny fat containing periumbilical hernia (series 3/image 49). Musculoskeletal: Visualized osseous structures are within normal limits. IMPRESSION: 3 mm gallstone 10 without associated inflammatory changes. Tiny fat containing periumbilical hernia. No evidence of bowel obstruction.  Normal appendix. Electronically Signed   By: Julian Hy M.D.   On: 10/23/2020 01:55    Assessment & Plan:   Problem List Items Addressed This Visit     Coronary atherosclerosis    2021: Coronary calcium CT score of 50. Calcium is a single focus in proximal LAD. On Lipitor No angina - physical work Cardiology referral suggested - pt declined To ER if problems      Chest pain, atypical - Primary    Likely MSK pain 2 episodes of sharp pain in the chest <1 second long. No SOB, sweats, DOE. 2021: Coronary calcium CT score of 50. Calcium is a single focus in proximal LAD. EKG CXR  01/2022 Cardiology referral suggested - pt declined To ER if problems       Relevant Orders   DG Chest 2 View   Other Visit Diagnoses     Flu vaccine need       Relevant Orders   Flu Vaccine QUAD 6+ mos PF IM (Fluarix Quad PF) (Completed)         No orders of the defined types were placed in this encounter.     Follow-up: Return in about 4 weeks (around 02/11/2022) for a follow-up visit.  Walker Kehr, MD

## 2022-01-15 ENCOUNTER — Encounter: Payer: Self-pay | Admitting: Internal Medicine

## 2022-01-15 ENCOUNTER — Other Ambulatory Visit: Payer: Self-pay | Admitting: Allergy and Immunology

## 2022-01-17 ENCOUNTER — Other Ambulatory Visit: Payer: Self-pay | Admitting: Allergy and Immunology

## 2022-01-17 ENCOUNTER — Other Ambulatory Visit: Payer: Self-pay | Admitting: Allergy

## 2022-01-17 MED ORDER — QVAR REDIHALER 80 MCG/ACT IN AERB: INHALATION_SPRAY | RESPIRATORY_TRACT | 4 refills | Status: DC

## 2022-01-17 NOTE — Progress Notes (Signed)
He has been out of his Qvar for 2 weeks and states he had requested refill from [place] but did not hear back.   I have sent Qvar to his pharmacy.   Advised he call pharmacy and if they did not receive prescription to give me a call back.

## 2022-01-21 ENCOUNTER — Other Ambulatory Visit: Payer: Self-pay | Admitting: Internal Medicine

## 2022-01-21 DIAGNOSIS — I2510 Atherosclerotic heart disease of native coronary artery without angina pectoris: Secondary | ICD-10-CM

## 2022-01-21 DIAGNOSIS — R0789 Other chest pain: Secondary | ICD-10-CM

## 2022-01-27 ENCOUNTER — Encounter: Payer: Self-pay | Admitting: Internal Medicine

## 2022-01-27 MED ORDER — CITALOPRAM HYDROBROMIDE 20 MG PO TABS: 20.0000 mg | ORAL_TABLET | Freq: Every day | ORAL | 1 refills | Status: DC

## 2022-01-27 NOTE — Telephone Encounter (Signed)
Please advise if refill is appropriate as it was last prescribed by historical provider back in June of 22. LOV 01/14/22

## 2022-01-27 NOTE — Telephone Encounter (Signed)
Ok done erx 

## 2022-01-28 ENCOUNTER — Telehealth (INDEPENDENT_AMBULATORY_CARE_PROVIDER_SITE_OTHER): Payer: Managed Care, Other (non HMO) | Admitting: Internal Medicine

## 2022-01-28 ENCOUNTER — Encounter: Payer: Self-pay | Admitting: Internal Medicine

## 2022-01-28 DIAGNOSIS — R253 Fasciculation: Secondary | ICD-10-CM | POA: Insufficient documentation

## 2022-01-28 DIAGNOSIS — R931 Abnormal findings on diagnostic imaging of heart and coronary circulation: Secondary | ICD-10-CM

## 2022-01-28 DIAGNOSIS — E7800 Pure hypercholesterolemia, unspecified: Secondary | ICD-10-CM

## 2022-01-28 DIAGNOSIS — R0789 Other chest pain: Secondary | ICD-10-CM | POA: Diagnosis not present

## 2022-01-28 DIAGNOSIS — E559 Vitamin D deficiency, unspecified: Secondary | ICD-10-CM | POA: Diagnosis not present

## 2022-01-28 NOTE — Assessment & Plan Note (Signed)
Pt has been referred to cardiology, cont asa, lipitor 10 mg qd

## 2022-01-28 NOTE — Assessment & Plan Note (Signed)
Of doubtful clinical signfiicance, ok for to check magnesium level

## 2022-01-28 NOTE — Patient Instructions (Signed)
Please go to the LAB at the blood drawing area for the tests to be done

## 2022-01-28 NOTE — Assessment & Plan Note (Addendum)
Lab Results  Component Value Date   LDLCALC 76 05/23/2021   Uncontrolled, goal ldl < 70, , pt to continue current statin lipitor 10 mg and lower chol diet, as declines change for now

## 2022-01-28 NOTE — Progress Notes (Signed)
Patient ID: Billy Morris, male   DOB: [birth year], 51 y.o.   MRN: [id number]  Virtual Visit via Video Note  I connected with Billy Morris on 01/28/22 at  2:20 PM EST by a video enabled telemedicine application and verified that I am speaking with the correct person using two identifiers.  Location of all participants today Patient: at home Provider: at office   I discussed the limitations of evaluation and management by telemedicine and the availability of in person appointments. The patient expressed understanding and agreed to proceed.  History of Present Illness: Here after seen per Billy Morris for likely MSK chest wall pain after dragging a heavy object around the house to the back yard.  Still has persistent left elbow pain though CP has resolved.  Did not have the Cxr done.  Has known hx of CAD and referred to cardiology with appt soon.  Pt denies chest pain, increased sob or doe, wheezing, orthopnea, PND, increased LE swelling, palpitations, dizziness or syncope.   Pt denies polydipsia, polyuria, or new focal neuro s/s.   Has muscle twitching as well and asking for magnesium level as his wife had this with low Mag and not taking mag supplement.   Past Medical History:  Diagnosis Date   Allergic rhinitis    Anal fissure    Anxiety    Asthma    COPD (chronic obstructive pulmonary disease) (HCC)    Diverticulosis    GERD (gastroesophageal reflux disease)    H. pylori infection    Hearing loss    chronic right   Hiatal hernia    Hyperlipidemia    LBP (low back pain)    TMJ (dislocation of temporomandibular joint)    Past Surgical History:  Procedure Laterality Date   laser retinopexy Right 2015   STAPEDECTOMY     VASECTOMY      reports that he has never smoked. He has never used smokeless tobacco. He reports that he does not drink alcohol and does not use drugs. family history includes Allergies in an other family member; Asthma in his son; COPD in his maternal grandfather; Colon  polyps in his maternal grandfather; Diabetes in his maternal aunt, maternal grandmother, and paternal aunt; Heart attack in his maternal grandmother; Heart failure in an other family member; Hyperlipidemia in his father and mother; Irritable bowel syndrome in his maternal aunt; Other in his mother; Ovarian cancer in his paternal grandmother; Prostate cancer in his maternal grandfather; Stroke in his maternal aunt. Allergies  Allergen Reactions   Beef (Bovine) Protein Hives    All red meat causes hives   Ciprofloxacin Other (See Comments) and Diarrhea    Bloody stool Bloody stool Stools have bloody mucous   Penicillins Rash   Sulfa Antibiotics Rash   Tiotropium Bromide Monohydrate Other (See Comments)    heart racing and paresthesias   Aspirin     Other reaction(s): Other (See Comments)   Nabumetone     Other reaction(s): Other (See Comments) weakness weakness   Sulfonamide Derivatives    Current Outpatient Medications on File Prior to Visit  Medication Sig Dispense Refill   albuterol (PROAIR HFA) 108 (90 Base) MCG/ACT inhaler INHALE TWO PUFFS 4 TIMES DAILY AS NEEDED FOR SHORTNESS OF BREATH 18 g 2   atorvastatin (LIPITOR) 10 MG tablet TAKE 1 TABLET BY MOUTH EVERY DAY 90 tablet 2   Cholecalciferol (THERA-D 2000) 50 MCG (2000 UT) TABS 1 tab by mouth once daily 30 tablet 99   Cholecalciferol (  VITAMIN D3 PO) Take by mouth.     citalopram (CELEXA) 20 MG tablet Take 1 tablet (20 mg total) by mouth daily. 90 tablet 1   EPINEPHrine (AUVI-Q) 0.3 mg/0.3 mL IJ SOAJ injection Inject 0.3 mg into the muscle as needed for anaphylaxis. 1 each 0   fluticasone (FLONASE) 50 MCG/ACT nasal spray Place 2 sprays into both nostrils daily. 16 g 5   gabapentin (NEURONTIN) 100 MG capsule Take 1 capsule (100 mg total) by mouth 3 (three) times daily. 90 capsule 5   halobetasol (ULTRAVATE) 0.05 % cream Apply topically 2 (two) times daily. 50 g 0   ketoconazole (NIZORAL) 2 % cream Apply 1 application topically  daily. 30 g 1   lansoprazole (PREVACID) 30 MG capsule TAKE 1 TABLET BY MOUTH EVERY DAY 30 capsule 0   levocetirizine (XYZAL) 5 MG tablet TAKE 1 TABLET BY MOUTH EVERY DAY IN THE EVENING AS NEEDED. 30 tablet 5   meloxicam (MOBIC) 15 MG tablet TAKE ONE TABLET BY MOUTH ONCE DAILY AS NEEDED 30 tablet 0   nystatin (MYCOSTATIN/NYSTOP) powder Use as directed twice per day to affected area as needed 30 g 2   QVAR REDIHALER 80 MCG/ACT inhaler INHALE 1 TO 2 PUFFS BY MOUTH TWICE A DAY 10.6 g 4   vitamin B-12 (CYANOCOBALAMIN) 1000 MCG tablet Take 1 tablet (1,000 mcg total) by mouth daily. 90 tablet 3   No current facility-administered medications on file prior to visit.    Observations/Objective: Alert, NAD, appropriate mood and affect, resps normal, cn 2-12 intact, moves all 4s, no visible rash or swelling Lab Results  Component Value Date   WBC 5.0 05/23/2021   HGB 13.7 05/23/2021   HCT 39.9 05/23/2021   PLT 217.0 05/23/2021   GLUCOSE 81 05/23/2021   CHOL 146 05/23/2021   TRIG 54.0 05/23/2021   HDL 59.00 05/23/2021   LDLCALC 76 05/23/2021   ALT 23 05/23/2021   AST 17 05/23/2021   NA 138 05/23/2021   K 4.1 05/23/2021   CL 104 05/23/2021   CREATININE 0.84 05/23/2021   BUN 21 05/23/2021   CO2 25 05/23/2021   TSH 1.58 05/23/2021   PSA 0.27 05/23/2021   HGBA1C 5.5 05/23/2021   Assessment and Plan: See notes  Follow Up Instructions: See notes   I discussed the assessment and treatment plan with the patient. The patient was provided an opportunity to ask questions and all were answered. The patient agreed with the plan and demonstrated an understanding of the instructions.   The patient was advised to call back or seek an in-person evaluation if the symptoms worsen or if the condition fails to improve as anticipated.  Cathlean Cower, MD

## 2022-01-28 NOTE — Assessment & Plan Note (Addendum)
Agree likely msk, for tylenol prn such as for left elbow aching, declines cxr for now

## 2022-01-28 NOTE — Assessment & Plan Note (Signed)
Last vitamin D Lab Results  Component Value Date   VD25OH 30.10 05/23/2021   Low, to start oral replacement

## 2022-02-04 ENCOUNTER — Encounter: Payer: Self-pay | Admitting: Internal Medicine

## 2022-02-04 ENCOUNTER — Other Ambulatory Visit (INDEPENDENT_AMBULATORY_CARE_PROVIDER_SITE_OTHER): Payer: Managed Care, Other (non HMO)

## 2022-02-04 DIAGNOSIS — R253 Fasciculation: Secondary | ICD-10-CM

## 2022-02-04 LAB — MAGNESIUM: Magnesium: 1.9 mg/dL (ref 1.5–2.5)

## 2022-02-05 ENCOUNTER — Ambulatory Visit: Payer: Managed Care, Other (non HMO) | Admitting: Internal Medicine

## 2022-02-05 NOTE — Addendum Note (Signed)
Addended by: Earnstine Regal on: 02/05/2022 05:02 PM   Modules accepted: Orders

## 2022-02-05 NOTE — Progress Notes (Cosign Needed Addendum)
Order was not put in on 01/14/22. Entered order.Marland KitchenJohny Chess  Medical screening examination/treatment/procedure(s) were performed by non-physician practitioner and as supervising physician I was immediately available for consultation/collaboration.  I agree with above. Lew Dawes, MD

## 2022-02-23 ENCOUNTER — Ambulatory Visit: Payer: Managed Care, Other (non HMO) | Attending: Cardiovascular Disease | Admitting: Cardiovascular Disease

## 2022-02-23 ENCOUNTER — Encounter: Payer: Self-pay | Admitting: Cardiovascular Disease

## 2022-02-23 VITALS — BP 138/80 | HR 71 | Ht 66.0 in | Wt 200.8 lb

## 2022-02-23 DIAGNOSIS — R072 Precordial pain: Secondary | ICD-10-CM

## 2022-02-23 MED ORDER — ASPIRIN 81 MG PO TBEC: 81.0000 mg | DELAYED_RELEASE_TABLET | Freq: Every day | ORAL | 3 refills | Status: AC

## 2022-02-23 MED ORDER — METOPROLOL TARTRATE 100 MG PO TABS: 100.0000 mg | ORAL_TABLET | Freq: Once | ORAL | 0 refills | Status: DC

## 2022-02-23 NOTE — Patient Instructions (Addendum)
Medication Instructions:  START ASPIRIN 81 MG EVERY DAY  *If you need a refill on your cardiac medications before your next appointment, please call your pharmacy*   Lab Work: LPA  TODAY  If you have labs (blood work) drawn today and your tests are completely normal, you will receive your results only by: [place] (if you have MyChart) OR A paper copy in the mail If you have any lab test that is abnormal or we need to change your treatment, we will call you to review the results.   Testing/Procedures:   Your cardiac CT will be scheduled at one of the below locations:   [facility] [address] [place], [place] [address] [phone]    If scheduled at [facility]: [place] Rhonda W, please arrive at the [facility] and Children's Entrance (Entrance C2) of [facility] 30 minutes prior to test start time. You can use the FREE valet parking offered at entrance C (encouraged to control the heart rate for the test)  Proceed to the [facility] Radiology Department (first floor) to check-in and test prep.  All radiology patients and guests should use entrance C2 at [facility], accessed from [facility], even though the hospital's physical address listed is [address].      Please follow these instructions carefully (unless otherwise directed):  Hold all erectile dysfunction medications at least 3 days (72 hrs) prior to test. (Ie viagra, cialis, sildenafil, tadalafil, etc) We will administer nitroglycerin during this exam.   On the Night Before the Test: Be sure to Drink plenty of water. Do not consume any caffeinated/decaffeinated beverages or chocolate 12 hours prior to your test. Do not take any antihistamines 12 hours prior to your test. On the Day of the Test: Drink plenty of water until 1 hour prior to the test. Do not eat any food 1 hour prior to test. You may take your regular medications prior to the test.  Take  metoprolol (Lopressor) two hours prior to test. HOLD Furosemide/Hydrochlorothiazide morning of the test.   *After the Test: Drink plenty of water. After receiving IV contrast, you may experience a mild flushed feeling. This is normal. On occasion, you may experience a mild rash up to 24 hours after the test. This is not dangerous. If this occurs, you can take Benadryl 25 mg and increase your fluid intake. If you experience trouble breathing, this can be serious. If it is severe call 911 IMMEDIATELY. If it is mild, please call our office. If you take any of these medications: Glipizide/Metformin, Avandament, Glucavance, please do not take 48 hours after completing test unless otherwise instructed.  We will call to schedule your test 2-4 weeks out understanding that some insurance companies will need an authorization prior to the service being performed.   For non-scheduling related questions, please contact the cardiac imaging nurse navigator should you have any questions/concerns: Marchia Bond, Cardiac Imaging Nurse Navigator Gordy Clement, Cardiac Imaging Nurse Navigator [place] [facility] Direct Office Dial: [phone]   For scheduling needs, including cancellations and rescheduling, please call [place], [phone].    Follow-Up: At [facility], you and your health needs are our priority.  As part of our continuing mission to provide you with exceptional heart care, we have created designated Provider Care Teams.  These Care Teams include your primary Cardiologist (physician) and Advanced Practice Providers (APPs -  Physician Assistants and Nurse Practitioners) who all work together to provide you  with the care you need, when you need it.  We recommend signing up for the patient portal called "MyChart".  Sign up information is provided on this After Visit Summary.  MyChart is used to connect with patients for Virtual Visits (Telemedicine).  Patients are  able to view lab/test results, encounter notes, upcoming appointments, etc.  Non-urgent messages can be sent to your provider as well.   To learn more about what you can do with MyChart, go to [email or web address].    Your next appointment:   3 month(s)  Provider:   DR Acie Fredrickson    Other Instructions NONE

## 2022-02-23 NOTE — Progress Notes (Signed)
Cardiology Office Note:    Date:  02/23/2022   ID:  Billy Morris, DOB 1970/07/17, MRN [id number]  PCP:  Biagio Borg, MD   [place] Providers Cardiologist:  Toddy Boyd     Referring MD: Cassandria Anger, MD   Chief Complaint  Patient presents with   Coronary Artery Disease          History of Present Illness:    Seen with Billy Morris ( wife )  Billy Morris is a 52 y.o. male with a hx of anxiety, COPD, HLD  Had some atypical chest pain while moving a heavy grill.  The pain lasted only for a second.  Coronary calcium score was found to be 50-isolated almost entirely to the proximal LAD.  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   Has lots of anxiety that causes left sided back pain , left jaw pain    Has been improving his diet ,  avoiding sugar, More fruits, veggies,   Lipids look ok   Non smoker,  does have some asthma   Will get LP(a) today   Will get a coronary CTA for his instrapular pain and left jaw pain     Fam hx Cousin age 26 had MI Another male cousin, MI age 14  3 aunts who had strokes, 2 of them had MI's Father has afib   Started Atorvastatin 1-2 years ago .    Past Medical History:  Diagnosis Date   Allergic rhinitis    Anal fissure    Anxiety    Asthma    COPD (chronic obstructive pulmonary disease) (HCC)    Diverticulosis    GERD (gastroesophageal reflux disease)    H. pylori infection    Hearing loss    chronic right   Hiatal hernia    Hyperlipidemia    LBP (low back pain)    TMJ (dislocation of temporomandibular joint)     Past Surgical History:  Procedure Laterality Date   laser retinopexy Right 2015   STAPEDECTOMY     VASECTOMY      Current Medications: Current Meds   Medication Sig   albuterol (PROAIR HFA) 108 (90 Base) MCG/ACT inhaler INHALE TWO PUFFS 4 TIMES DAILY AS NEEDED FOR SHORTNESS OF BREATH   aspirin EC 81 MG tablet Take 1 tablet (81 mg total) by mouth daily. Swallow whole.   atorvastatin (LIPITOR) 10 MG tablet TAKE 1 TABLET BY MOUTH EVERY DAY   Cholecalciferol (VITAMIN D3 PO) Take by mouth.   EPINEPHrine (AUVI-Q) 0.3 mg/0.3 mL IJ SOAJ injection Inject 0.3 mg into the muscle as needed for anaphylaxis.   fluticasone (FLONASE) 50 MCG/ACT nasal spray Place 2 sprays into both nostrils daily.   gabapentin (NEURONTIN) 100 MG capsule Take 1 capsule (100 mg total) by mouth 3 (three) times daily.   halobetasol (ULTRAVATE) 0.05 % cream Apply topically 2 (two) times daily.   ketoconazole (NIZORAL) 2 % cream Apply 1 application topically daily.   lansoprazole (PREVACID) 30 MG capsule TAKE 1 TABLET BY MOUTH EVERY DAY   levocetirizine (XYZAL) 5 MG tablet TAKE 1 TABLET BY MOUTH EVERY DAY IN THE EVENING AS NEEDED.   meloxicam (MOBIC) 15  MG tablet TAKE ONE TABLET BY MOUTH ONCE DAILY AS NEEDED   metoprolol tartrate (LOPRESSOR) 100 MG tablet Take 1 tablet (100 mg total) by mouth once for 1 dose. Take 90-120 minutes prior to scan.   nystatin (MYCOSTATIN/NYSTOP) powder Use as directed twice per day to affected area as needed   QVAR REDIHALER 80 MCG/ACT inhaler INHALE 1 TO 2 PUFFS BY MOUTH TWICE A DAY   vitamin B-12 (CYANOCOBALAMIN) 1000 MCG tablet Take 1 tablet (1,000 mcg total) by mouth daily.     Allergies:   Beef (bovine) protein, Ciprofloxacin, Penicillins, Sulfa antibiotics, Tiotropium bromide monohydrate, Nabumetone, and Sulfonamide derivatives   Social History   Socioeconomic History   Marital status: Married    Spouse name: Not on file   Number of children: 2   Years of education: Not on file   Highest education level: Not on file  Occupational History   Occupation: Financial controller  Tobacco Use   Smoking status: Never   Smokeless tobacco:  Never  Vaping Use   Vaping Use: Never used  Substance and Sexual Activity   Alcohol use: No   Drug use: No   Sexual activity: Not on file  Other Topics Concern   Not on file  Social History Narrative   Not on file   Social Determinants of Health   Financial Resource Strain: Not on file  Food Insecurity: Not on file  Transportation Needs: Not on file  Physical Activity: Not on file  Stress: Not on file  Social Connections: Not on file     Family History: The patient's family history includes Allergies in an other family member; Asthma in his son; Atrial fibrillation in his father; COPD in his maternal grandfather; Colon polyps in his maternal grandfather; Diabetes in his maternal aunt, maternal grandmother, and paternal aunt; Heart attack in his maternal grandmother; Heart failure in an other family member; Hyperlipidemia in his father and mother; Irritable bowel syndrome in his maternal aunt; Other in his mother; Ovarian cancer in his paternal grandmother; Prostate cancer in his maternal grandfather; Stroke in his maternal aunt. There is no history of Allergic rhinitis, Angioedema, Atopy, Eczema, Immunodeficiency, or Urticaria.  ROS:   Please see the history of present illness.     All other systems reviewed and are negative.  EKGs/Labs/Other Studies Reviewed:    The following studies were reviewed today:   EKG: February 23, 2022: Normal sinus rhythm at 71.  Normal EKG.   Recent Labs: 05/23/2021: ALT 23; BUN 21; Creatinine, Ser 0.84; Hemoglobin 13.7; Platelets 217.0; Potassium 4.1; Sodium 138; TSH 1.58 02/04/2022: Magnesium 1.9  Recent Lipid Panel    Component Value Date/Time   CHOL 146 05/23/2021 0754   TRIG 54.0 05/23/2021 0754   HDL 59.00 05/23/2021 0754   CHOLHDL 2 05/23/2021 0754   VLDL 10.8 05/23/2021 0754   LDLCALC 76 05/23/2021 0754     Risk Assessment/Calculations:                Physical Exam:    VS:  BP 138/80   Pulse 71   Ht '5\' 6"'$  (1.676 m)    Wt 200 lb 12.8 oz (91.1 kg)   SpO2 97%   BMI 32.41 kg/m     Wt Readings from Last 3 Encounters:  02/23/22 200 lb 12.8 oz (91.1 kg)  01/14/22 206 lb (93.4 kg)  06/06/21 196 lb (88.9 kg)     GEN:  Well nourished, well developed in no acute distress HEENT: Normal NECK:  No JVD; No carotid bruits LYMPHATICS: No lymphadenopathy CARDIAC: RRR, no murmurs, rubs, gallops RESPIRATORY:  Clear to auscultation without rales, wheezing or rhonchi  ABDOMEN: Soft, non-tender, non-distended MUSCULOSKELETAL:  No edema; No deformity  SKIN: Warm and dry NEUROLOGIC:  Alert and oriented x 3 PSYCHIATRIC:  Normal affect   ASSESSMENT:    1. Precordial pain    PLAN:    In order of problems listed above:  Coronary artery disease: Josef presents for further evaluation and management of a positive coronary calcium score.  He is having some episodes of chest pressure and intrascapular pressure.  He thinks that these are more associated with mental stress or anxiety.  It also should be noted that he is fairly physical at his job and during these times of stress he is also physically active.  The symptoms could be related to unstable angina.  Will schedule him for a coronary CT angiogram for further evaluation.  I have started him on aspirin 81 mg a day.  He previously was told not to take aspirin because his pediatrician many years ago told him that he had thin blood.  He has not had any significant bleeding issues.  Will try 81 mg a day.  If he does develop any GI bleeding or nosebleeding then may need to be referred to hematologist for actual hematology workup.  2.  Hyperlipidemia: LDL is currently 75.  If he is found to have significant coronary artery disease and will need to try to achieve an LDL goal of 55. Will draw a lipoprotein a today.            Medication Adjustments/Labs and Tests Ordered: Current medicines are reviewed at length with the patient today.  Concerns regarding medicines are  outlined above.  Orders Placed This Encounter  Procedures   CT CORONARY MORPH W/CTA COR W/SCORE W/CA W/CM &/OR WO/CM   Lipoprotein A (LPA)   EKG 12-Lead   Meds ordered this encounter  Medications   metoprolol tartrate (LOPRESSOR) 100 MG tablet    Sig: Take 1 tablet (100 mg total) by mouth once for 1 dose. Take 90-120 minutes prior to scan.    Dispense:  1 tablet    Refill:  0   aspirin EC 81 MG tablet    Sig: Take 1 tablet (81 mg total) by mouth daily. Swallow whole.    Dispense:  90 tablet    Refill:  3    Patient Instructions  Medication Instructions:  START ASPIRIN 81 MG EVERY DAY  *If you need a refill on your cardiac medications before your next appointment, please call your pharmacy*   Lab Work: LPA  TODAY  If you have labs (blood work) drawn today and your tests are completely normal, you will receive your results only by: [place] (if you have MyChart) OR A paper copy in the mail If you have any lab test that is abnormal or we need to change your treatment, we will call you to review the results.   Testing/Procedures:   Your cardiac CT will be scheduled at one of the below locations:   [facility] [address] [place], [place] [address] [phone]    If scheduled at [facility], please arrive at the [facility] and Children's Entrance (Entrance C2) of [facility] 30 minutes prior to test start time. You can use the FREE valet parking offered at entrance C (encouraged to control the heart rate for the test)  Proceed to  the [facility] Radiology Department (first floor) to check-in and test prep.  All radiology patients and guests should use entrance C2 at [facility], accessed from [facility], even though the hospital's physical address listed is [address]      Please follow these instructions carefully (unless otherwise directed):  Hold all erectile dysfunction medications at least  3 days (72 hrs) prior to test. (Ie viagra, cialis, sildenafil, tadalafil, etc) We will administer nitroglycerin during this exam.   On the Night Before the Test: Be sure to Drink plenty of water. Do not consume any caffeinated/decaffeinated beverages or chocolate 12 hours prior to your test. Do not take any antihistamines 12 hours prior to your test. On the Day of the Test: Drink plenty of water until 1 hour prior to the test. Do not eat any food 1 hour prior to test. You may take your regular medications prior to the test.  Take metoprolol (Lopressor) two hours prior to test. HOLD Furosemide/Hydrochlorothiazide morning of the test.   *After the Test: Drink plenty of water. After receiving IV contrast, you may experience a mild flushed feeling. This is normal. On occasion, you may experience a mild rash up to 24 hours after the test. This is not dangerous. If this occurs, you can take Benadryl 25 mg and increase your fluid intake. If you experience trouble breathing, this can be serious. If it is severe call 911 IMMEDIATELY. If it is mild, please call our office. If you take any of these medications: Glipizide/Metformin, Avandament, Glucavance, please do not take 48 hours after completing test unless otherwise instructed.  We will call to schedule your test 2-4 weeks out understanding that some insurance companies will need an authorization prior to the service being performed.   For non-scheduling related questions, please contact the cardiac imaging nurse navigator should you have any questions/concerns: Marchia Bond, Cardiac Imaging Nurse Navigator Gordy Clement, Cardiac Imaging Nurse Navigator [place] [facility] Direct Office Dial: [phone]   For scheduling needs, including cancellations and rescheduling, please call [place], [phone].    Follow-Up: At [facility], you and your health needs are our priority.  As part of our continuing  mission to provide you with exceptional heart care, we have created designated Provider Care Teams.  These Care Teams include your primary Cardiologist (physician) and Advanced Practice Providers (APPs -  Physician Assistants and Nurse Practitioners) who all work together to provide you with the care you need, when you need it.  We recommend signing up for the patient portal called "MyChart".  Sign up information is provided on this After Visit Summary.  MyChart is used to connect with patients for Virtual Visits (Telemedicine).  Patients are able to view lab/test results, encounter notes, upcoming appointments, etc.  Non-urgent messages can be sent to your provider as well.   To learn more about what you can do with MyChart, go to [email or web address].    Your next appointment:   3 month(s)  Provider:   DR Acie Fredrickson    Other Instructions NONE    Signed, Mertie Moores, MD  02/23/2022 9:32 AM    [place]

## 2022-02-24 ENCOUNTER — Encounter: Payer: Self-pay | Admitting: Cardiovascular Disease

## 2022-02-24 LAB — LIPOPROTEIN A (LPA): Lipoprotein (a): <8.4 nmol/L (ref <75.0)

## 2022-02-26 ENCOUNTER — Telehealth (HOSPITAL_COMMUNITY): Payer: Self-pay | Admitting: Emergency Medicine

## 2022-02-26 NOTE — Telephone Encounter (Signed)
Attempted to call patient regarding upcoming cardiac CT appointment. Left message on voicemail with name and callback number Marchia Bond RN Navigator Cardiac [facility] [phone] Office [phone] Cell

## 2022-02-27 ENCOUNTER — Ambulatory Visit (HOSPITAL_COMMUNITY)
Admission: RE | Admit: 2022-02-27 | Discharge: 2022-02-27 | Disposition: A | Discharge status: Home / Self Care | Payer: Managed Care, Other (non HMO) | Source: Ambulatory Visit | Attending: Cardiovascular Disease | Admitting: Cardiovascular Disease

## 2022-02-27 DIAGNOSIS — R072 Precordial pain: Secondary | ICD-10-CM | POA: Insufficient documentation

## 2022-02-27 MED ORDER — IOHEXOL 350 MG/ML SOLN
100.0000 mL | Freq: Once | INTRAVENOUS | Status: AC | PRN
  Administered 2022-02-27: 100 mL via INTRAVENOUS

## 2022-02-27 MED ORDER — NITROGLYCERIN 0.4 MG SL SUBL
0.8000 mg | SUBLINGUAL_TABLET | Freq: Once | SUBLINGUAL | Status: AC
  Administered 2022-02-27: 0.8 mg via SUBLINGUAL

## 2022-02-27 MED ORDER — NITROGLYCERIN 0.4 MG SL SUBL
SUBLINGUAL_TABLET | SUBLINGUAL | Status: AC
  Filled 2022-02-27: qty 2

## 2022-02-28 ENCOUNTER — Encounter: Payer: Self-pay | Admitting: Cardiovascular Disease

## 2022-02-28 DIAGNOSIS — R030 Elevated blood-pressure reading, without diagnosis of hypertension: Secondary | ICD-10-CM

## 2022-02-28 DIAGNOSIS — R931 Abnormal findings on diagnostic imaging of heart and coronary circulation: Secondary | ICD-10-CM

## 2022-02-28 DIAGNOSIS — R0789 Other chest pain: Secondary | ICD-10-CM

## 2022-03-02 MED ORDER — ROSUVASTATIN CALCIUM 20 MG PO TABS: 20.0000 mg | ORAL_TABLET | Freq: Every day | ORAL | 3 refills | Status: DC

## 2022-03-02 NOTE — Telephone Encounter (Signed)
Thayer Headings, MD 02/27/2022  5:14 PM EST     Coronary calcium score of 67.1.  This places him in the 83rd percentile. RCA: Normal Left main: Normal LAD: Mild to moderate 25 to 49% ostial stenosis. LCx: Normal   Nonobstructive coronary artery disease.  Continue medical therapy.  His LDL goal is 55.  His LP(a) is normal  at < 8.4   Lets DC atorvastatin .  Start Rosuvastatin 20 mg a day Check lipids , ALT , BMP in 3 months

## 2022-03-10 ENCOUNTER — Ambulatory Visit: Payer: Managed Care, Other (non HMO) | Admitting: Allergy and Immunology

## 2022-03-10 ENCOUNTER — Other Ambulatory Visit: Payer: Self-pay | Admitting: Allergy and Immunology

## 2022-03-10 ENCOUNTER — Other Ambulatory Visit: Payer: Self-pay

## 2022-03-10 ENCOUNTER — Encounter: Payer: Self-pay | Admitting: Allergy and Immunology

## 2022-03-10 VITALS — BP 122/76 | HR 66 | Temp 98.5°F | Resp 16 | Wt 198.1 lb

## 2022-03-10 DIAGNOSIS — J4540 Moderate persistent asthma, uncomplicated: Secondary | ICD-10-CM | POA: Diagnosis not present

## 2022-03-10 DIAGNOSIS — T7800XD Anaphylactic reaction due to unspecified food, subsequent encounter: Secondary | ICD-10-CM | POA: Diagnosis not present

## 2022-03-10 DIAGNOSIS — J301 Allergic rhinitis due to pollen: Secondary | ICD-10-CM

## 2022-03-10 DIAGNOSIS — J3089 Other allergic rhinitis: Secondary | ICD-10-CM

## 2022-03-10 DIAGNOSIS — K219 Gastro-esophageal reflux disease without esophagitis: Secondary | ICD-10-CM

## 2022-03-10 MED ORDER — EPINEPHRINE 0.3 MG/0.3ML IJ SOAJ: 0.3000 mg | INTRAMUSCULAR | 1 refills | Status: AC | PRN

## 2022-03-10 MED ORDER — FLUTICASONE PROPIONATE 50 MCG/ACT NA SUSP: NASAL | 5 refills | Status: AC

## 2022-03-10 MED ORDER — LANSOPRAZOLE 30 MG PO CPDR: DELAYED_RELEASE_CAPSULE | ORAL | 5 refills | Status: DC

## 2022-03-10 MED ORDER — ALBUTEROL SULFATE HFA 108 (90 BASE) MCG/ACT IN AERS: INHALATION_SPRAY | RESPIRATORY_TRACT | 1 refills | Status: DC

## 2022-03-10 MED ORDER — QVAR REDIHALER 80 MCG/ACT IN AERB: INHALATION_SPRAY | RESPIRATORY_TRACT | 4 refills | Status: DC

## 2022-03-10 MED ORDER — LEVOCETIRIZINE DIHYDROCHLORIDE 5 MG PO TABS: ORAL_TABLET | ORAL | 5 refills | Status: DC

## 2022-03-10 NOTE — Progress Notes (Signed)
Gb

## 2022-03-10 NOTE — Patient Instructions (Addendum)
  1.  Continue to perform allergen avoidance measures - beef, dust mite, pollen, cat, dog, mold  2.  Continue to treat and prevent inflammation:   A.  Flonase - 1 spray each nostril 1-2 times per day depending on disease activity  B.  Qvar 80 -2 inhalations 1-2 times per day depending on disease activity  3.  Continue to treat and prevent reflux:   A.  Prevacid 30 mg -1 tablet 1-2 times a day  B.  Continue to minimize caffeine and chocolate consumption  4. If needed:   A.  Pro-air HFA -2 inhalations every 4-6 hours  B.  Xyzal 5 mg -1 tablet daily  C.  Auvi-Q 0.3 / Epi-Pen, Benadryl, MD/ER evaluation for allergic reaction  5.  "Action Plan" for flare up:   A.  Increase Qvar to 3 inhalations 3 times per day  B.  Albuterol HFA if needed.   6.  Return to clinic in 12 months or earlier if problem

## 2022-03-10 NOTE — Progress Notes (Signed)
[facility] [place] [facility] [place]   Follow-up Note  Referring Provider: Biagio Borg, MD Primary Provider: Biagio Borg, MD Date of Office Visit: 03/10/2022  Subjective:   Billy Morris (DOB: 15-Feb-1970) is a 52 y.o. male who returns to the [facility] on 03/10/2022 in re-evaluation of the following:  HPI: Deuce presents to this clinic in evaluation of asthma, allergic rhinitis, LPR, and food allergy directed against mammal consumption.  I last saw him in this clinic 29 October 2020.  He has really done very well regarding his asthma and rarely uses a short acting bronchodilator and has not had an exacerbation that has required the administration of systemic steroid.  Likewise, his nose has really been doing quite well and he has not required an antibiotic to treat an episode of sinusitis.  He believes that his reflux is under very good control at this point in time.  He remains away from consuming beef.  He can eat pork without any problem.  He has obtained this years flu vaccine.  Allergies as of 03/10/2022       Reactions   Beef (bovine) Protein Hives   All red meat causes hives   Ciprofloxacin Other (See Comments), Diarrhea   Bloody stool Bloody stool Stools have bloody mucous   Penicillins Rash   Sulfa Antibiotics Rash   Tiotropium Bromide Monohydrate Other (See Comments)   heart racing and paresthesias   Nabumetone    Other reaction(s): Other (See Comments) weakness weakness   Sulfonamide Derivatives         Medication List    albuterol 108 (90 Base) MCG/ACT inhaler Commonly known as: ProAir HFA INHALE TWO PUFFS 4 TIMES DAILY AS NEEDED FOR SHORTNESS OF BREATH   aspirin EC 81 MG tablet Take 1 tablet (81 mg total) by mouth daily. Swallow whole.   citalopram 20 MG tablet Commonly known as: CELEXA Take 1 tablet (20 mg total) by mouth daily.   cyanocobalamin 1000 MCG tablet Commonly known as: VITAMIN B12 Take 1  tablet (1,000 mcg total) by mouth daily.   EPINEPHrine 0.3 mg/0.3 mL Soaj injection Commonly known as: Auvi-Q Inject 0.3 mg into the muscle as needed for anaphylaxis.   fluticasone 50 MCG/ACT nasal spray Commonly known as: FLONASE 1 spray per nostril 1-2 times daily as needed.   gabapentin 100 MG capsule Commonly known as: NEURONTIN Take 1 capsule (100 mg total) by mouth 3 (three) times daily.   halobetasol 0.05 % cream Commonly known as: Ultravate Apply topically 2 (two) times daily.   ketoconazole 2 % cream Commonly known as: NIZORAL Apply 1 application topically daily.   lansoprazole 30 MG capsule Commonly known as: PREVACID TAKE 1 CAPSULE BY MOUTH 1-2 TIMES DAILY   levocetirizine 5 MG tablet Commonly known as: XYZAL TAKE 1 TABLET BY MOUTH EVERY DAY IN THE EVENING AS NEEDED.   meloxicam 15 MG tablet Commonly known as: MOBIC TAKE ONE TABLET BY MOUTH ONCE DAILY AS NEEDED   metoprolol tartrate 100 MG tablet Commonly known as: Lopressor Take 1 tablet (100 mg total) by mouth once for 1 dose. Take 90-120 minutes prior to scan.   nystatin powder Commonly known as: MYCOSTATIN/NYSTOP Use as directed twice per day to affected area as needed   Qvar RediHaler 80 MCG/ACT inhaler Generic drug: beclomethasone 1 puff 1-2 times daily depending on disease activity. What changed: additional instructions Changed by: Humberto Addo Kevan Rosebush, MD   rosuvastatin 20 MG tablet  Commonly known as: CRESTOR Take 1 tablet (20 mg total) by mouth daily.   VITAMIN D3 PO Take by mouth.   Thera-D 2000 54 MCG (2000 UT) Tabs Generic drug: Cholecalciferol 1 tab by mouth once daily    Past Medical History:  Diagnosis Date   Allergic rhinitis    Anal fissure    Anxiety    Asthma    COPD (chronic obstructive pulmonary disease) (HCC)    Diverticulosis    GERD (gastroesophageal reflux disease)    H. pylori infection    Hearing loss    chronic right   Hiatal hernia    Hyperlipidemia    LBP (low  back pain)    TMJ (dislocation of temporomandibular joint)     Past Surgical History:  Procedure Laterality Date   laser retinopexy Right 2015   STAPEDECTOMY     VASECTOMY      Review of systems negative except as noted in HPI / PMHx or noted below:  Review of Systems  Constitutional: Negative.   HENT: Negative.    Eyes: Negative.   Respiratory: Negative.    Cardiovascular: Negative.   Gastrointestinal: Negative.   Genitourinary: Negative.   Musculoskeletal: Negative.   Skin: Negative.   Neurological: Negative.   Endo/Heme/Allergies: Negative.   Psychiatric/Behavioral: Negative.       Objective:   Vitals:   03/10/22 1202  BP: 122/76  Pulse: 66  Resp: 16  Temp: 98.5 F (36.9 C)  SpO2: 99%      Weight: 198 lb 1.6 oz (89.9 kg)   Physical Exam Constitutional:      Appearance: He is not diaphoretic.  HENT:     Head: Normocephalic.     Right Ear: Tympanic membrane, ear canal and external ear normal.     Left Ear: Tympanic membrane, ear canal and external ear normal.     Nose: Nose normal. No mucosal edema or rhinorrhea.     Mouth/Throat:     Pharynx: Uvula midline. No oropharyngeal exudate.  Eyes:     Conjunctiva/sclera: Conjunctivae normal.  Neck:     Thyroid: No thyromegaly.     Trachea: Trachea normal. No tracheal tenderness or tracheal deviation.  Cardiovascular:     Rate and Rhythm: Normal rate and regular rhythm.     Heart sounds: Normal heart sounds, S1 normal and S2 normal. No murmur heard. Pulmonary:     Effort: No respiratory distress.     Breath sounds: Normal breath sounds. No stridor. No wheezing or rales.  Lymphadenopathy:     Head:     Right side of head: No tonsillar adenopathy.     Left side of head: No tonsillar adenopathy.     Cervical: No cervical adenopathy.  Skin:    Findings: No erythema or rash.     Nails: There is no clubbing.  Neurological:     Mental Status: He is alert.     Diagnostics:    Spirometry was performed  and demonstrated an FEV1 of 2.89 at 85 % of predicted.  Assessment and Plan:   1. Asthma, moderate persistent, well-controlled   2. Perennial allergic rhinitis   3. Seasonal allergic rhinitis due to pollen   4. LPRD (laryngopharyngeal reflux disease)   5. Anaphylactic shock due to food, subsequent encounter    1.  Continue to perform allergen avoidance measures - beef, dust mite, pollen, cat, dog, mold  2.  Continue to treat and prevent inflammation:   A.  Flonase - 1 spray each  nostril 1-2 times per day depending on disease activity  B.  Qvar 80 -2 inhalations 1-2 times per day depending on disease activity  3.  Continue to treat and prevent reflux:   A.  Prevacid 30 mg -1 tablet 1-2 times a day  B.  Continue to minimize caffeine and chocolate consumption  4. If needed:   A.  Pro-air HFA -2 inhalations every 4-6 hours  B.  Xyzal 5 mg -1 tablet daily  C.  Auvi-Q 0.3 / Epi-Pen, Benadryl, MD/ER evaluation for allergic reaction  5.  "Action Plan" for flare up:   A.  Increase Qvar to 3 inhalations 3 times per day  B.  Albuterol HFA if needed.   6.  Return to clinic in 12 months or earlier if problem  Casmir appears to be doing pretty well with attention to allergen avoidance measures and the use of anti-inflammatory agents delivered to both his upper and lower airway and attention to his reflux with the plan noted above.  He has a very good understanding of his disease state and how his medications work and appropriate dosing of his medications depending on disease activity.  Assuming he does well with this plan I will see him back in this clinic in 1 year or earlier if there is a problem.  Allena Katz, MD Allergy / Immunology [place]

## 2022-03-11 ENCOUNTER — Encounter: Payer: Self-pay | Admitting: Cardiovascular Disease

## 2022-03-15 IMAGING — CT CT CARDIAC CORONARY ARTERY CALCIUM SCORE
3 series · 14 of 20 positions shown, 15 images · non-contrast
Comparison: None.
COMPARISON: None.

Addendum:
EXAM:
OVER-READ INTERPRETATION  CT CHEST

The following report is an over-read performed by radiologist Dr.
Nafiza Ahiyan [REDACTED] on 09/01/2019. This
over-read does not include interpretation of cardiac or coronary
anatomy or pathology. The coronary calcium score interpretation by
the cardiologist is attached.
CLINICAL DATA: Risk stratification
Coronary Calcium Score
TECHNIQUE: The patient was scanned on a Siemens Force scanner. Axial
non-contrast 3 mm slices were carried out through the heart. The
data set was analyzed on a dedicated work station and scored using
the Agatson method.

[Series 2: casc 3.0 bv41 2 bestdiast 69 % · axial · 0.40mm/px · z∈[-230,-149]mm · 4 of 46 slices shown, 5 images]
[im 10/46  vessel]
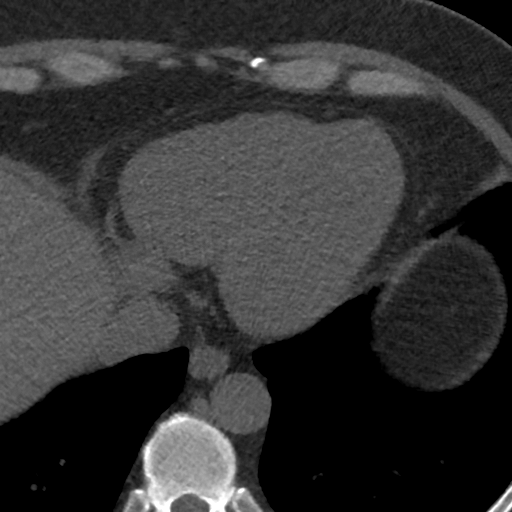
[im 10/46  lung]
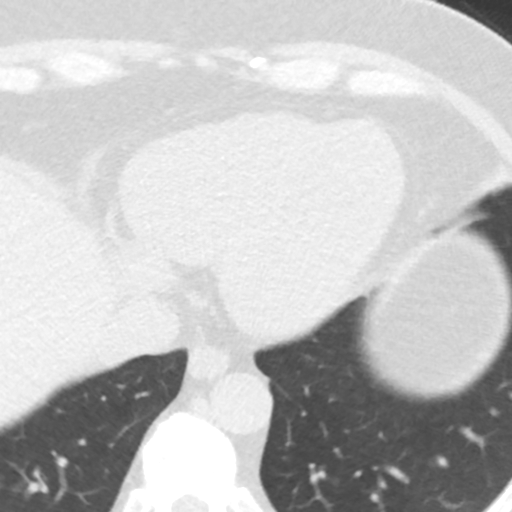
[im 19/46  vessel]
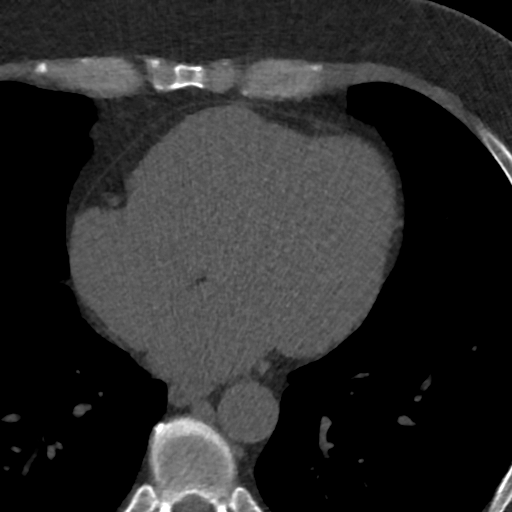
[im 28/46  vessel]
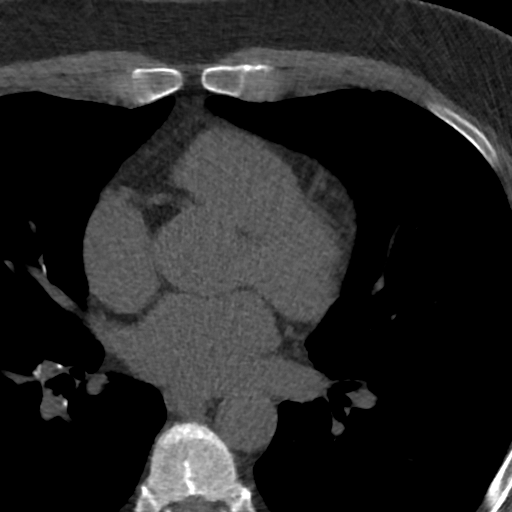
[im 37/46  vessel]
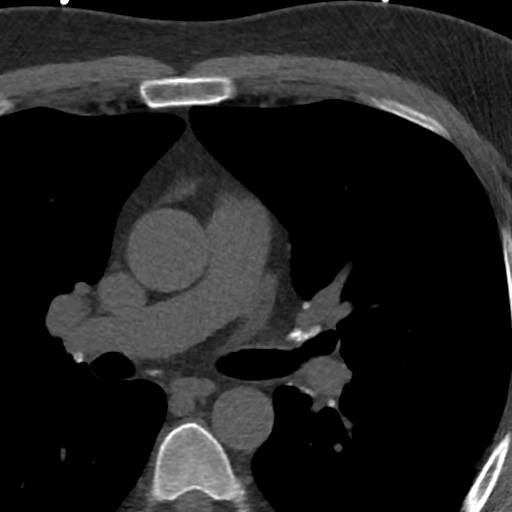

[Series 3: lung 70 % · axial · 0.68mm/px · z∈[-236,-146]mm · 5 of 46 slices shown]
[im 8/46  lung]
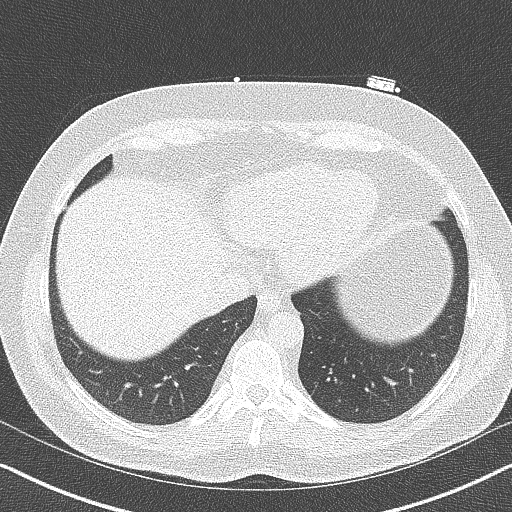
[im 16/46  lung]
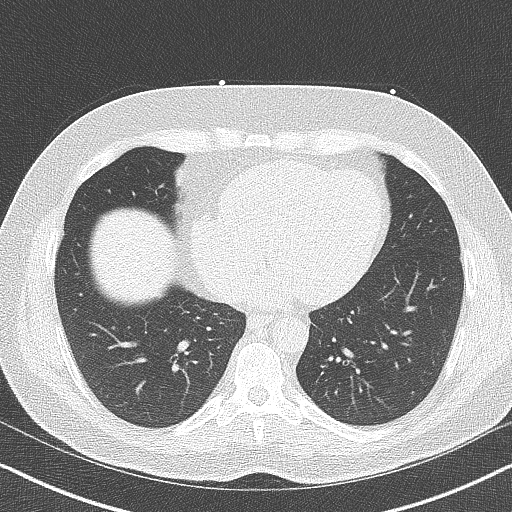
[im 23/46  lung]
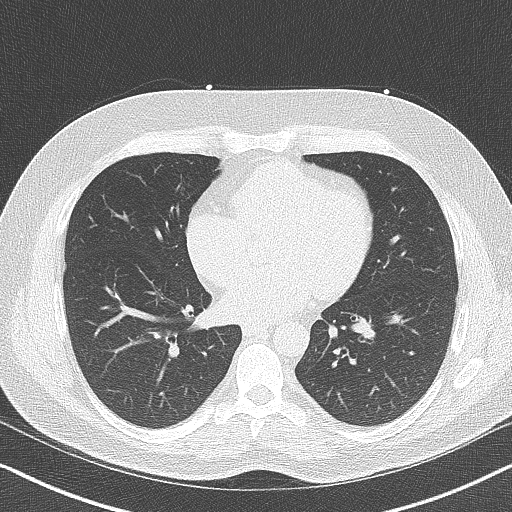
[im 31/46  lung]
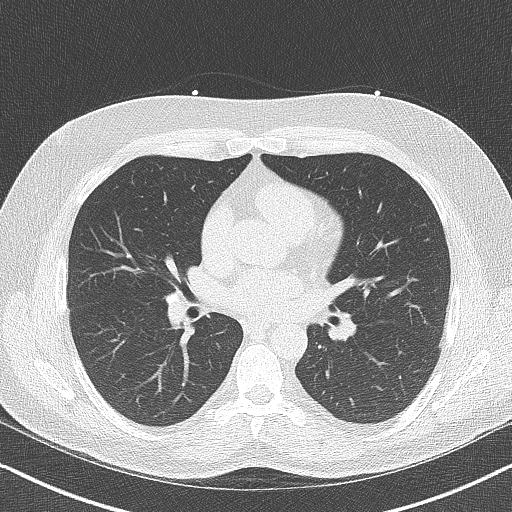
[im 38/46  lung]
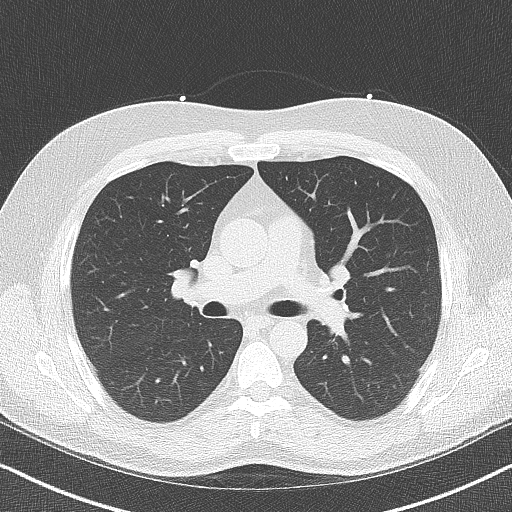

[Series 4: lung st 70 % · axial · 0.68mm/px · z∈[-236,-146]mm · 5 of 46 slices shown]
[im 8/46  lung]
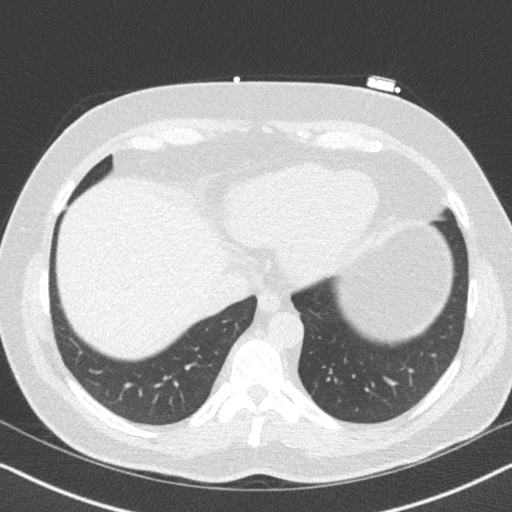
[im 16/46  lung]
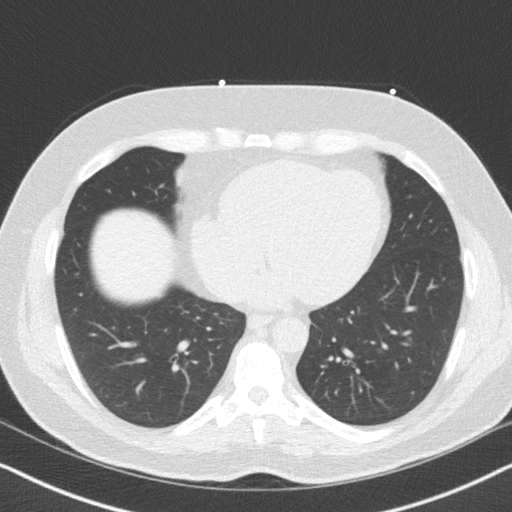
[im 23/46  lung]
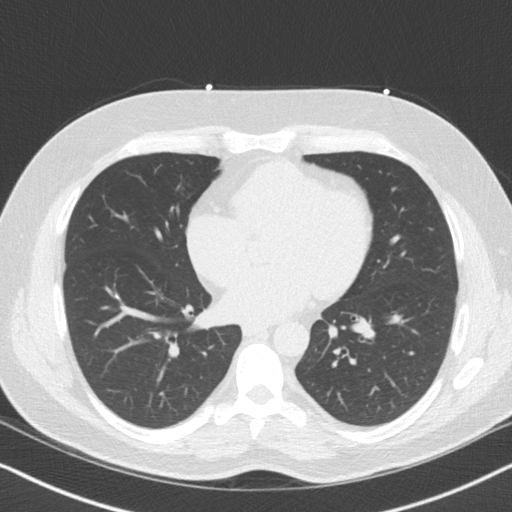
[im 31/46  lung]
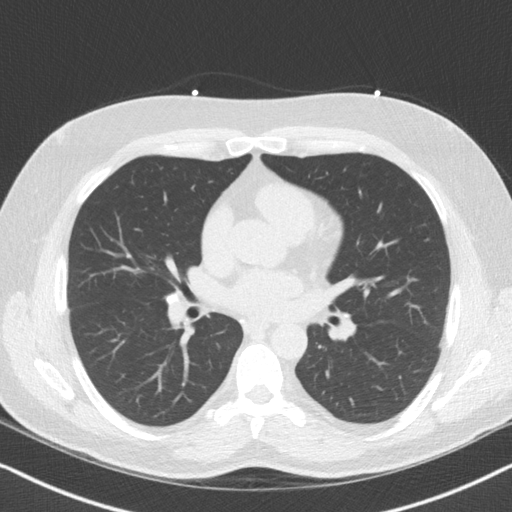
[im 38/46  lung]
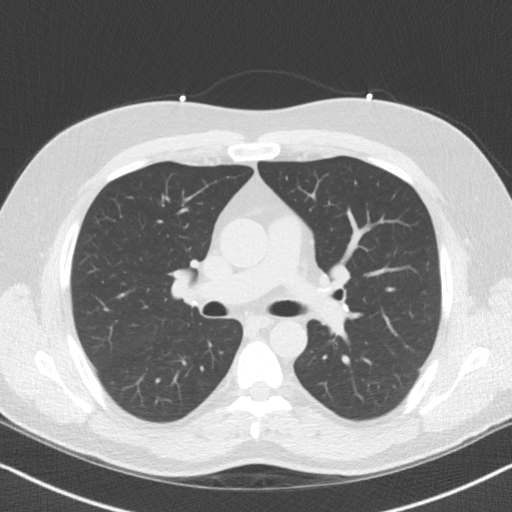

[14 of 20 positions shown; findings below may reference images not displayed]

FINDINGS: Within the visualized portions of the thorax there are no suspicious
appearing pulmonary nodules or masses, there is no acute
consolidative airspace disease, no pleural effusions, no
pneumothorax and no lymphadenopathy. Visualized portions of the
upper abdomen are unremarkable. There are no aggressive appearing
lytic or blastic lesions noted in the visualized portions of the
skeleton.
IMPRESSION: No significant incidental noncardiac findings are noted.
FINDINGS: Non-cardiac: See separate report from [REDACTED].

Ascending Aorta: Normal size, measuring 34 mm at the mid ascending
aorta, measured double oblique at PA bifurcation. No significant
calcification.

Pericardium: Normal

Coronary arteries: Arise from normal coronary cusps.
IMPRESSION: Coronary calcium score of 50. This was 84th percentile for age and
sex matched control. Calcium is a single focus in proximal LAD.

Bilaal Simbulan

*** End of Addendum ***
EXAM:
OVER-READ INTERPRETATION  CT CHEST

The following report is an over-read performed by radiologist Dr.
Nafiza Ahiyan [REDACTED] on 09/01/2019. This
over-read does not include interpretation of cardiac or coronary
anatomy or pathology. The coronary calcium score interpretation by
the cardiologist is attached.
FINDINGS: Within the visualized portions of the thorax there are no suspicious
appearing pulmonary nodules or masses, there is no acute
consolidative airspace disease, no pleural effusions, no
pneumothorax and no lymphadenopathy. Visualized portions of the
upper abdomen are unremarkable. There are no aggressive appearing
lytic or blastic lesions noted in the visualized portions of the
skeleton.
IMPRESSION: No significant incidental noncardiac findings are noted.

## 2022-03-18 ENCOUNTER — Encounter: Payer: Self-pay | Admitting: Allergy and Immunology

## 2022-04-07 ENCOUNTER — Other Ambulatory Visit: Payer: Self-pay | Admitting: Internal Medicine

## 2022-05-21 ENCOUNTER — Encounter: Payer: Self-pay | Admitting: Cardiovascular Disease

## 2022-05-21 ENCOUNTER — Ambulatory Visit: Payer: Managed Care, Other (non HMO) | Attending: Cardiovascular Disease

## 2022-05-21 DIAGNOSIS — R0789 Other chest pain: Secondary | ICD-10-CM

## 2022-05-21 DIAGNOSIS — R030 Elevated blood-pressure reading, without diagnosis of hypertension: Secondary | ICD-10-CM

## 2022-05-21 DIAGNOSIS — R931 Abnormal findings on diagnostic imaging of heart and coronary circulation: Secondary | ICD-10-CM

## 2022-05-21 NOTE — Progress Notes (Signed)
Cardiology Office Note:    Date:  05/22/2022   ID:  Billy Morris, DOB Apr 02, 1970, MRN [id number]  PCP:  Corwin Levins, MD   [facility] Providers Cardiologist:  Averee Harb     Referring MD: Corwin Levins, MD   Chief Complaint  Patient presents with   Chest Pain     History of Present Illness:    Seen with Sue Lush ( wife )  Billy Morris is a 52 y.o. male with a hx of anxiety, COPD, HLD  Had some atypical chest pain while moving a heavy grill.  The pain lasted only for a second.  Coronary calcium score was found to be 50-isolated almost entirely to the proximal LAD.  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   Has lots of anxiety that causes left sided back pain , left jaw pain    Has been improving his diet ,  avoiding sugar, More fruits, veggies,   Lipids look ok   Non smoker,  does have some asthma   Will get LP(a) today   Will get a coronary CTA for his instrapular pain and left jaw pain     Fam hx Cousin age 51 had MI Another male cousin, MI age 40  3 aunts who had strokes, 2 of them had MI's Father has afib   Started Atorvastatin 1-2 years ago .   May 22, 2022: Billy Morris is  seen today for follow-up of his chest discomfort.  He has a history of hyperlipidemia.  He has a strong family history of coronary artery disease.  Coronary CT angiogram from February 27, 2022 reveals a coronary calcium score of 67.1.  This places him in the 83rd percentile for age and sex matched controls.  His mixed nonobstructive coronary artery disease.  His LDL goal is between 50 and 70. LP(a) is less than 8.4.   LDL was 51  Trigs = 69   Wt is 173 lbs   Listens to " the exam room " pod cast   Past Medical History:  Diagnosis Date    Allergic rhinitis    Anal fissure    Anxiety    Asthma    COPD (chronic obstructive pulmonary disease)    Diverticulosis    GERD (gastroesophageal reflux disease)    H. pylori infection    Hearing loss    chronic right   Hiatal hernia    Hyperlipidemia    LBP (low back pain)    TMJ (dislocation of temporomandibular joint)     Past Surgical History:  Procedure Laterality Date   laser retinopexy Right 2015   STAPEDECTOMY     VASECTOMY      Current Medications: No outpatient medications have been marked as taking for the 05/22/22 encounter (Office Visit) with Khian Remo, Deloris Ping, MD.     Allergies:   Beef (bovine) protein, Ciprofloxacin, Penicillins, Sulfa antibiotics, Tiotropium bromide monohydrate, Nabumetone, and Sulfonamide derivatives   Social History   Socioeconomic History   Marital status: Married    Spouse name: Not on file   Number of children: 2   Years of education: Not on file   Highest education level: Not on file  Occupational History   Occupation: Regulatory affairs officertamping tech operator  Tobacco Use   Smoking status: Never   Smokeless tobacco: Never  Vaping Use   Vaping Use: Never used  Substance and Sexual Activity   Alcohol use: No   Drug use: No   Sexual activity: Not on file  Other Topics Concern   Not on file  Social History Narrative   Not on file   Social Determinants of Health   Financial Resource Strain: Not on file  Food Insecurity: Not on file  Transportation Needs: Not on file  Physical Activity: Not on file  Stress: Not on file  Social Connections: Not on file     Family History: The patient's family history includes Allergies in an other family member; Asthma in his son; Atrial fibrillation in his father; COPD in his maternal grandfather; Colon polyps in his maternal grandfather; Diabetes in his maternal aunt, maternal grandmother, and paternal aunt; Heart attack in his maternal grandmother; Heart failure in an other family member;  Hyperlipidemia in his father and mother; Irritable bowel syndrome in his maternal aunt; Other in his mother; Ovarian cancer in his paternal grandmother; Prostate cancer in his maternal grandfather; Stroke in his maternal aunt. There is no history of Allergic rhinitis, Angioedema, Atopy, Eczema, Immunodeficiency, or Urticaria.  ROS:   Please see the history of present illness.     All other systems reviewed and are negative.  EKGs/Labs/Other Studies Reviewed:    The following studies were reviewed today:   EKG:    Recent Labs: 05/23/2021: Hemoglobin 13.7; Platelets 217.0; TSH 1.58 02/04/2022: Magnesium 1.9 05/21/2022: ALT 28; BUN 9; Creatinine, Ser 0.77; Potassium 4.8; Sodium 141  Recent Lipid Panel    Component Value Date/Time   CHOL 113 05/21/2022 0726   TRIG 69 05/21/2022 0726   HDL 47 05/21/2022 0726   CHOLHDL 2.4 05/21/2022 0726   CHOLHDL 2 05/23/2021 0754   VLDL 10.8 05/23/2021 0754   LDLCALC 51 05/21/2022 0726     Risk Assessment/Calculations:                Physical Exam:    Physical Exam: Blood pressure 124/78, pulse 70, height 5\' 6"  (1.676 m), weight 173 lb (78.5 kg), SpO2 98 %.       GEN:  Well nourished, well developed in no acute distress HEENT: Normal NECK: No JVD; No carotid bruits LYMPHATICS: No lymphadenopathy CARDIAC: RRR , no murmurs, rubs, gallops RESPIRATORY:  Clear to auscultation without rales, wheezing or rhonchi  ABDOMEN: Soft, non-tender, non-distended MUSCULOSKELETAL:  No edema; No deformity  SKIN: Warm and dry NEUROLOGIC:  Alert and oriented x 3     ASSESSMENT:    1. Hyperlipidemia, unspecified hyperlipidemia type   2. Elevated coronary artery calcium score     PLAN:       Coronary artery disease: Has coronary calcifications.  His lipids look great.  He is not having any angina.  Continue current medications.  2.  Hyperlipidemia: His last LDL is 51.  He is changed his diet quite a bit.  He is on rosuvastatin.  Continue  current medications.  Will see him again in 1 year for follow-up lipids, ALT and basic metabolic profile.            Medication Adjustments/Labs and Tests Ordered: Current medicines are reviewed at length with the patient today.  Concerns regarding medicines are outlined above.  Orders Placed This Encounter  Procedures   Lipid panel   ALT   Basic metabolic  panel   No orders of the defined types were placed in this encounter.   Patient Instructions  Medication Instructions:  Your physician recommends that you continue on your current medications as directed. Please refer to the Current Medication list given to you today.  *If you need a refill on your cardiac medications before your next appointment, please call your pharmacy*   Lab Work: Lipids, ALT, BMET  1 week prior to annual follow up If you have labs (blood work) drawn today and your tests are completely normal, you will receive your results only by: MyChart Message (if you have MyChart) OR A paper copy in the mail If you have any lab test that is abnormal or we need to change your treatment, we will call you to review the results.   Follow-Up: At [facility], you and your health needs are our priority.  As part of our continuing mission to provide you with exceptional heart care, we have created designated Provider Care Teams.  These Care Teams include your primary Cardiologist (physician) and Advanced Practice Providers (APPs -  Physician Assistants and Nurse Practitioners) who all work together to provide you with the care you need, when you need it.   Your next appointment:   1 year(s)  Provider:   Dr. Melburn Popper    Signed, Kristeen Miss, MD  05/22/2022 5:32 PM    [facility]

## 2022-05-22 ENCOUNTER — Encounter: Payer: Self-pay | Admitting: Cardiovascular Disease

## 2022-05-22 ENCOUNTER — Ambulatory Visit: Payer: Managed Care, Other (non HMO) | Attending: Cardiovascular Disease | Admitting: Cardiovascular Disease

## 2022-05-22 VITALS — BP 124/78 | HR 70 | Ht 66.0 in | Wt 173.0 lb

## 2022-05-22 DIAGNOSIS — E785 Hyperlipidemia, unspecified: Secondary | ICD-10-CM | POA: Diagnosis not present

## 2022-05-22 DIAGNOSIS — R931 Abnormal findings on diagnostic imaging of heart and coronary circulation: Secondary | ICD-10-CM

## 2022-05-22 LAB — LIPID PANEL
Chol/HDL Ratio: 2.4 ratio (ref 0.0–5.0)
Cholesterol, Total: 113 mg/dL (ref 100–199)
HDL: 47 mg/dL (ref >39)
LDL Chol Calc (NIH): 51 mg/dL (ref 0–99)
Triglycerides: 69 mg/dL (ref 0–149)
VLDL Cholesterol Cal: 15 mg/dL (ref 5–40)

## 2022-05-22 LAB — BASIC METABOLIC PANEL
BUN/Creatinine Ratio: 12 (ref 9–20)
BUN: 9 mg/dL (ref 6–24)
CO2: 25 mmol/L (ref 20–29)
Calcium: 9.7 mg/dL (ref 8.7–10.2)
Chloride: 101 mmol/L (ref 96–106)
Creatinine, Ser: 0.77 mg/dL (ref 0.76–1.27)
Glucose: 88 mg/dL (ref 70–99)
Potassium: 4.8 mmol/L (ref 3.5–5.2)
Sodium: 141 mmol/L (ref 134–144)
eGFR: 108 mL/min/{1.73_m2} (ref >59)

## 2022-05-22 LAB — ALT: ALT: 28 IU/L (ref 0–44)

## 2022-05-22 NOTE — Patient Instructions (Signed)
Medication Instructions:  Your physician recommends that you continue on your current medications as directed. Please refer to the Current Medication list given to you today.  *If you need a refill on your cardiac medications before your next appointment, please call your pharmacy*   Lab Work: Lipids, ALT, BMET  1 week prior to annual follow up If you have labs (blood work) drawn today and your tests are completely normal, you will receive your results only by: MyChart Message (if you have MyChart) OR A paper copy in the mail If you have any lab test that is abnormal or we need to change your treatment, we will call you to review the results.   Follow-Up: At [facility], you and your health needs are our priority.  As part of our continuing mission to provide you with exceptional heart care, we have created designated Provider Care Teams.  These Care Teams include your primary Cardiologist (physician) and Advanced Practice Providers (APPs -  Physician Assistants and Nurse Practitioners) who all work together to provide you with the care you need, when you need it.   Your next appointment:   1 year(s)  Provider:   Dr. Melburn Popper

## 2022-05-28 ENCOUNTER — Other Ambulatory Visit: Payer: Self-pay | Admitting: Internal Medicine

## 2022-06-12 ENCOUNTER — Encounter: Payer: Managed Care, Other (non HMO) | Admitting: Internal Medicine

## 2022-06-15 ENCOUNTER — Encounter: Payer: Self-pay | Admitting: Internal Medicine

## 2022-06-17 ENCOUNTER — Other Ambulatory Visit (INDEPENDENT_AMBULATORY_CARE_PROVIDER_SITE_OTHER): Payer: Managed Care, Other (non HMO)

## 2022-06-17 DIAGNOSIS — E538 Deficiency of other specified B group vitamins: Secondary | ICD-10-CM | POA: Diagnosis not present

## 2022-06-17 DIAGNOSIS — E559 Vitamin D deficiency, unspecified: Secondary | ICD-10-CM | POA: Diagnosis not present

## 2022-06-17 DIAGNOSIS — R739 Hyperglycemia, unspecified: Secondary | ICD-10-CM | POA: Diagnosis not present

## 2022-06-17 DIAGNOSIS — E7800 Pure hypercholesterolemia, unspecified: Secondary | ICD-10-CM | POA: Diagnosis not present

## 2022-06-17 LAB — URINALYSIS, ROUTINE W REFLEX MICROSCOPIC
Bilirubin Urine: NEGATIVE
Hgb urine dipstick: NEGATIVE
Ketones, ur: NEGATIVE
Leukocytes,Ua: NEGATIVE
Nitrite: NEGATIVE
RBC / HPF: NONE SEEN (ref 0–?)
Specific Gravity, Urine: <=1.005 — AB (ref 1.000–1.030)
Total Protein, Urine: NEGATIVE
Urine Glucose: NEGATIVE
Urobilinogen, UA: 0.2 (ref 0.0–1.0)
WBC, UA: NONE SEEN (ref 0–?)
pH: 7 (ref 5.0–8.0)

## 2022-06-17 LAB — HEPATIC FUNCTION PANEL
ALT: 38 U/L (ref 0–53)
AST: 24 U/L (ref 0–37)
Albumin: 4.4 g/dL (ref 3.5–5.2)
Alkaline Phosphatase: 69 U/L (ref 39–117)
Bilirubin, Direct: 0.2 mg/dL (ref 0.0–0.3)
Total Bilirubin: 1.1 mg/dL (ref 0.2–1.2)
Total Protein: 6.6 g/dL (ref 6.0–8.3)

## 2022-06-17 LAB — CBC WITH DIFFERENTIAL/PLATELET
Basophils Absolute: 0 10*3/uL (ref 0.0–0.1)
Basophils Relative: 0.7 % (ref 0.0–3.0)
Eosinophils Absolute: 0.2 10*3/uL (ref 0.0–0.7)
Eosinophils Relative: 2.8 % (ref 0.0–5.0)
HCT: 42 % (ref 39.0–52.0)
Hemoglobin: 14.5 g/dL (ref 13.0–17.0)
Lymphocytes Relative: 25.3 % (ref 12.0–46.0)
Lymphs Abs: 1.6 10*3/uL (ref 0.7–4.0)
MCHC: 34.4 g/dL (ref 30.0–36.0)
MCV: 94.3 fl (ref 78.0–100.0)
Monocytes Absolute: 0.5 10*3/uL (ref 0.1–1.0)
Monocytes Relative: 8.6 % (ref 3.0–12.0)
Neutro Abs: 3.9 10*3/uL (ref 1.4–7.7)
Neutrophils Relative %: 62.6 % (ref 43.0–77.0)
Platelets: 215 10*3/uL (ref 150.0–400.0)
RBC: 4.46 Mil/uL (ref 4.22–5.81)
RDW: 12.7 % (ref 11.5–15.5)
WBC: 6.3 10*3/uL (ref 4.0–10.5)

## 2022-06-17 LAB — LIPID PANEL
Cholesterol: 120 mg/dL (ref 0–200)
HDL: 50 mg/dL (ref >39.00)
LDL Cholesterol: 57 mg/dL (ref 0–99)
NonHDL: 69.59
Total CHOL/HDL Ratio: 2
Triglycerides: 63 mg/dL (ref 0.0–149.0)
VLDL: 12.6 mg/dL (ref 0.0–40.0)

## 2022-06-17 LAB — BASIC METABOLIC PANEL
BUN: 14 mg/dL (ref 6–23)
CO2: 29 mEq/L (ref 19–32)
Calcium: 9.5 mg/dL (ref 8.4–10.5)
Chloride: 103 mEq/L (ref 96–112)
Creatinine, Ser: 0.78 mg/dL (ref 0.40–1.50)
GFR: 102.97 mL/min (ref >60.00)
Glucose, Bld: 87 mg/dL (ref 70–99)
Potassium: 4.9 mEq/L (ref 3.5–5.1)
Sodium: 140 mEq/L (ref 135–145)

## 2022-06-17 LAB — VITAMIN D 25 HYDROXY (VIT D DEFICIENCY, FRACTURES): VITD: 33.28 ng/mL (ref 30.00–100.00)

## 2022-06-17 LAB — TSH: TSH: 1.95 u[IU]/mL (ref 0.35–5.50)

## 2022-06-17 LAB — VITAMIN B12: Vitamin B-12: 524 pg/mL (ref 211–911)

## 2022-06-17 LAB — PSA: PSA: 0.28 ng/mL (ref 0.10–4.00)

## 2022-06-17 LAB — HEMOGLOBIN A1C: Hgb A1c MFr Bld: 5.5 % (ref 4.6–6.5)

## 2022-06-19 ENCOUNTER — Encounter: Payer: Self-pay | Admitting: Internal Medicine

## 2022-06-19 ENCOUNTER — Ambulatory Visit: Payer: Managed Care, Other (non HMO) | Admitting: Internal Medicine

## 2022-06-19 VITALS — BP 132/78 | HR 67 | Temp 98.6°F | Ht 66.0 in | Wt 170.0 lb

## 2022-06-19 DIAGNOSIS — E538 Deficiency of other specified B group vitamins: Secondary | ICD-10-CM | POA: Diagnosis not present

## 2022-06-19 DIAGNOSIS — E7800 Pure hypercholesterolemia, unspecified: Secondary | ICD-10-CM

## 2022-06-19 DIAGNOSIS — E559 Vitamin D deficiency, unspecified: Secondary | ICD-10-CM

## 2022-06-19 DIAGNOSIS — R931 Abnormal findings on diagnostic imaging of heart and coronary circulation: Secondary | ICD-10-CM

## 2022-06-19 DIAGNOSIS — R739 Hyperglycemia, unspecified: Secondary | ICD-10-CM | POA: Diagnosis not present

## 2022-06-19 DIAGNOSIS — Z125 Encounter for screening for malignant neoplasm of prostate: Secondary | ICD-10-CM | POA: Diagnosis not present

## 2022-06-19 DIAGNOSIS — Z0001 Encounter for general adult medical examination with abnormal findings: Secondary | ICD-10-CM

## 2022-06-19 NOTE — Patient Instructions (Addendum)
Please have your Shingrix (shingles) shots done at your local pharmacy.  Please continue all other medications as before, and refills have been done if requested.  Please have the pharmacy call with any other refills you may need.  Please continue your efforts at being more active, low cholesterol diet, and weight control.  You are otherwise up to date with prevention measures today.  Please keep your appointments with your specialists as you may have planned  Please make an Appointment to return for your 1 year visit, or sooner if needed, with Lab testing by Appointment as well, to be done about 3-5 days before at the FIRST FLOOR Lab (so this is for TWO appointments - please see the scheduling desk as you leave)   

## 2022-06-19 NOTE — Progress Notes (Unsigned)
Patient ID: Billy Morris, male   DOB: 1970/07/04, 52 y.o.   MRN: [id number]         Chief Complaint:: wellness exam and low vit d, hld, cad , low vit d and b12       HPI:  [name] is a 52 y.o. male here for wellness exam; for shingrix at pharmacy, declines covid booster, o/w up to date                        Also Pt denies chest pain, increased sob or doe, wheezing, orthopnea, PND, increased LE swelling, palpitations, dizziness or syncope.   Pt denies polydipsia, polyuria, or new focal neuro s/s.    Pt denies fever, wt loss, night sweats, loss of appetite, or other constitutional symptoms   Lost 30 lbs with better diet attention to cardaic risk factors.    Wt Readings from Last 3 Encounters:  06/19/22 170 lb (77.1 kg)  05/22/22 173 lb (78.5 kg)  03/10/22 198 lb 1.6 oz (89.9 kg)   BP Readings from Last 3 Encounters:  06/19/22 132/78  05/22/22 124/78  03/10/22 122/76   Immunization History  Administered Date(s) Administered   Influenza Split 11/05/2011   Influenza, Quadrivalent, Recombinant, Inj, Pf 11/25/2018   Influenza,inj,Quad PF,6+ Mos 11/08/2012, 10/03/2013, 01/31/2015, 12/08/2016, 11/09/2017, 12/17/2017, 01/14/2022   PFIZER(Purple Top)SARS-COV-2 Vaccination 04/14/2019, 05/16/2019   Td 02/09/2005, 04/15/2010   Tdap 05/10/2020   Health Maintenance Due  Topic Date Due   Zoster Vaccines- Shingrix (1 of 2) Never done      Past Medical History:  Diagnosis Date   Allergic rhinitis    Anal fissure    Anxiety    Asthma    COPD (chronic obstructive pulmonary disease) (HCC)    Diverticulosis    GERD (gastroesophageal reflux disease)    H. pylori infection    Hearing loss    chronic right   Hiatal hernia    Hyperlipidemia    LBP (low back pain)    TMJ (dislocation of temporomandibular joint)    Past Surgical History:  Procedure Laterality Date   laser retinopexy Right 2015   STAPEDECTOMY     VASECTOMY      reports that he has never smoked. He has never used  smokeless tobacco. He reports that he does not drink alcohol and does not use drugs. family history includes Allergies in an other family member; Asthma in his son; Atrial fibrillation in his father; COPD in his maternal grandfather; Colon polyps in his maternal grandfather; Diabetes in his maternal aunt, maternal grandmother, and paternal aunt; Heart attack in his maternal grandmother; Heart failure in an other family member; Hyperlipidemia in his father and mother; Irritable bowel syndrome in his maternal aunt; Other in his mother; Ovarian cancer in his paternal grandmother; Prostate cancer in his maternal grandfather; Stroke in his maternal aunt. Allergies  Allergen Reactions   Beef (Bovine) Protein Hives    All red meat causes hives   Ciprofloxacin Other (See Comments) and Diarrhea    Bloody stool Bloody stool Stools have bloody mucous   Penicillins Rash   Sulfa Antibiotics Rash   Tiotropium Bromide Monohydrate Other (See Comments)    heart racing and paresthesias   Nabumetone     Other reaction(s): Other (See Comments) weakness weakness   Sulfonamide Derivatives    Current Outpatient Medications on File Prior to Visit  Medication Sig Dispense Refill   albuterol (PROAIR HFA) 108 (90 Base) MCG/ACT  inhaler INHALE TWO PUFFS 4 TIMES DAILY AS NEEDED FOR SHORTNESS OF BREATH 18 g 1   aspirin EC 81 MG tablet Take 1 tablet (81 mg total) by mouth daily. Swallow whole. 90 tablet 3   Cholecalciferol (THERA-D 2000) 50 MCG (2000 UT) TABS 1 tab by mouth once daily 30 tablet 99   Cholecalciferol (VITAMIN D3 PO) Take by mouth.     EPINEPHrine (AUVI-Q) 0.3 mg/0.3 mL IJ SOAJ injection Inject 0.3 mg into the muscle as needed for anaphylaxis. 1 each 1   fluticasone (FLONASE) 50 MCG/ACT nasal spray 1 spray per nostril 1-2 times daily as needed. 16 g 5   ketoconazole (NIZORAL) 2 % cream Apply 1 application topically daily. 30 g 1   lansoprazole (PREVACID) 30 MG capsule TAKE 1 CAPSULE BY MOUTH 1-2 TIMES  DAILY 60 capsule 5   levocetirizine (XYZAL) 5 MG tablet TAKE 1 TABLET BY MOUTH EVERY DAY IN THE EVENING AS NEEDED. 30 tablet 5   meloxicam (MOBIC) 15 MG tablet TAKE ONE TABLET BY MOUTH ONCE DAILY AS NEEDED 30 tablet 0   nystatin (MYCOSTATIN/NYSTOP) powder Use as directed twice per day to affected area as needed 30 g 2   QVAR REDIHALER 80 MCG/ACT inhaler 1 puff 1-2 times daily depending on disease activity. 10.6 g 4   rosuvastatin (CRESTOR) 20 MG tablet Take 1 tablet (20 mg total) by mouth daily. 90 tablet 3   vitamin B-12 (CYANOCOBALAMIN) 1000 MCG tablet Take 1 tablet (1,000 mcg total) by mouth daily. 90 tablet 3   No current facility-administered medications on file prior to visit.        ROS:  All others reviewed and negative.  Objective        PE:  BP 132/78 (BP Location: Right Arm, Patient Position: Sitting, Cuff Size: Normal)   Pulse 67   Temp 98.6 F (37 C) (Oral)   Ht 5\' 6"  (1.676 m)   Wt 170 lb (77.1 kg)   SpO2 97%   BMI 27.44 kg/m                 Constitutional: Pt appears in NAD               HENT: Head: NCAT.                Right Ear: External ear normal.                 Left Ear: External ear normal.                Eyes: . Pupils are equal, round, and reactive to light. Conjunctivae and EOM are normal               Nose: without d/c or deformity               Neck: Neck supple. Gross normal ROM               Cardiovascular: Normal rate and regular rhythm.                 Pulmonary/Chest: Effort normal and breath sounds without rales or wheezing.                Abd:  Soft, NT, ND, + BS, no organomegaly               Neurological: Pt is alert. At baseline orientation, motor grossly intact  Skin: Skin is warm. No rashes, no other new lesions, LE edema - none               Psychiatric: Pt behavior is normal without agitation   Micro: none  Cardiac tracings I have personally interpreted today:  none  Pertinent Radiological findings (summarize): none    Lab Results  Component Value Date   WBC 6.3 06/17/2022   HGB 14.5 06/17/2022   HCT 42.0 06/17/2022   PLT 215.0 06/17/2022   GLUCOSE 87 06/17/2022   CHOL 120 06/17/2022   TRIG 63.0 06/17/2022   HDL 50.00 06/17/2022   LDLCALC 57 06/17/2022   ALT 38 06/17/2022   AST 24 06/17/2022   NA 140 06/17/2022   K 4.9 06/17/2022   CL 103 06/17/2022   CREATININE 0.78 06/17/2022   BUN 14 06/17/2022   CO2 29 06/17/2022   TSH 1.95 06/17/2022   PSA 0.28 06/17/2022   HGBA1C 5.5 06/17/2022   Assessment/Plan:  BOHDAN PIZZINI is a 52 y.o. White or Caucasian [1] male with  has a past medical history of Allergic rhinitis, Anal fissure, Anxiety, Asthma, COPD (chronic obstructive pulmonary disease) (HCC), Diverticulosis, GERD (gastroesophageal reflux disease), H. pylori infection, Hearing loss, Hiatal hernia, Hyperlipidemia, LBP (low back pain), and TMJ (dislocation of temporomandibular joint).  No problem-specific Assessment & Plan notes found for this encounter.  Followup: No follow-ups on file.  Oliver Barre, MD 06/19/2022 2:27 PM [facility]

## 2022-06-24 ENCOUNTER — Encounter: Payer: Self-pay | Admitting: Internal Medicine

## 2022-06-24 NOTE — Assessment & Plan Note (Signed)
Lab Results  Component Value Date   LDLCALC 57 06/17/2022   Stable, pt to continue current statin crestor 20 mg qd

## 2022-06-24 NOTE — Assessment & Plan Note (Signed)
Lab Results  Component Value Date   VITAMINB12 524 06/17/2022   Stable, cont oral replacement - b12 1000 mcg qd

## 2022-06-24 NOTE — Assessment & Plan Note (Signed)
Last vitamin D Lab Results  Component Value Date   VD25OH 33.28 06/17/2022   Low, to start oral replacement

## 2022-06-24 NOTE — Assessment & Plan Note (Signed)
Stable without cp, for contd low chol diet, crestor 20 mg qd

## 2022-06-24 NOTE — Assessment & Plan Note (Signed)
Age and sex appropriate education and counseling updated with regular exercise and diet Referrals for preventative services - none needed Immunizations addressed - for shingrix at phrmacy, declines covid booster Smoking counseling  - none needed Evidence for depression or other mood disorder - none significant Most recent labs reviewed. I have personally reviewed and have noted: 1) the patient's medical and social history 2) The patient's current medications and supplements 3) The patient's height, weight, and BMI have been recorded in the chart

## 2022-09-28 ENCOUNTER — Encounter: Payer: Self-pay | Admitting: Internal Medicine

## 2022-09-28 DIAGNOSIS — K429 Umbilical hernia without obstruction or gangrene: Secondary | ICD-10-CM

## 2022-10-05 ENCOUNTER — Ambulatory Visit: Payer: Self-pay | Admitting: Surgery

## 2022-10-05 NOTE — H&P (Signed)
Subjective:  CC: Umbilical hernia with obstruction, without gangrene [K42.0]  HPI:  Billy Morris is a 52 y.o. male who was referred by Corwin Levins, MD for evaluation of above. Symptoms were first noted several years ago. Pain is sharp and intermittent, confined to the periumbilical area, with recent worsening after wt loss.  Associated with nothing, exacerbated by touch, exertion.  Lump is not reducible.    Past Medical History:  has a past medical history of Anxiety, Asthma, unspecified asthma severity, unspecified whether complicated, unspecified whether persistent (HHS-HCC), GERD (gastroesophageal reflux disease), and Hyperlipidemia.  Past Surgical History:  Past Surgical History: Procedure Laterality Date  ear surgery'    VASECTOMY     Family History: family history includes High blood pressure (Hypertension) in his father; Hyperlipidemia (Elevated cholesterol) in his father, mother, and sister.  Social History:  reports that he has never smoked. He has never used smokeless tobacco. He reports that he does not drink alcohol and does not use drugs.  Current Medications: has a current medication list which includes the following prescription(s): albuterol mdi (proventil, ventolin, proair) hfa, aspirin, qvar redihaler, epinephrine, fluticasone propionate, lansoprazole, rosuvastatin, atorvastatin, citalopram, and levocetirizine.  Allergies:  Allergies as of 10/05/2022 - Reviewed 10/05/2022 Allergen Reaction Noted  Beef containing products Hives 07/06/2012  Bovine cartilage Hives 07/06/2012  Tiotropium Other (See Comments) 04/11/2010  Ciprofloxacin Diarrhea and Other (See Comments) 10/06/2006  Nabumetone Other (See Comments) 10/03/2013  Penicillins Rash 07/06/2012  Sulfa (sulfonamide antibiotics) Rash and Other (See Comments) 10/06/2006   ROS:  A 15 point review of systems was performed and pertinent positives and negatives noted in HPI   Objective:    BP 118/68   Pulse 67    Ht 167.6 cm (5\' 6" )   Wt 70.7 kg (155 lb 12.8 oz)   BMI 25.15 kg/m   Constitutional :  Alert, cooperative, no distress Lymphatics/Throat:  Supple, no lymphadenopathy Respiratory:  clear to auscultation bilaterally Cardiovascular:  regular rate and rhythm Gastrointestinal: soft, non-tender; bowel sounds normal; no masses,  no organomegaly. umbilical hernia noted.  small, incarcerated, no overlying skin changes, and no TTP Musculoskeletal: Steady gait and movement Skin: Cool and moist Psychiatric: Normal affect, non-agitated, not confused     LABS:  N/A   RADS: CLINICAL DATA:  Abdominal pain   EXAM:  CT ABDOMEN AND PELVIS WITH CONTRAST   TECHNIQUE:  Multidetector CT imaging of the abdomen and pelvis was performed  using the standard protocol following bolus administration of  intravenous contrast.   CONTRAST:  OMNIPAQUE IOHEXOL 350 MG/ML SOLN   COMPARISON:  03/27/2016   FINDINGS:  Lower chest: Lung bases are clear.   Hepatobiliary: Liver is within normal limits.   Gallbladder is notable for a 3 mm gallstone (series 3/image 31),  without associated inflammatory changes. No intrahepatic or  extrahepatic ductal dilatation.   Pancreas: Within normal limits.   Spleen: Within normal limits.   Adrenals/Urinary Tract: Adrenal glands are within normal limits.   Left kidney is notable for a subcentimeter cyst in the left lower  pole (series 3/image 42). Right kidney is within normal limits. No  hydronephrosis.   Bladder is within normal limits.   Stomach/Bowel: Stomach is within normal limits.   No evidence of bowel obstruction.   Normal appendix (series 3/image 44).   Vascular/Lymphatic: No evidence of abdominal aortic aneurysm.   No suspicious abdominopelvic lymphadenopathy.   Reproductive: Prostate is notable for dystrophic calcifications.   Other: No abdominopelvic ascites.  Tiny fat containing periumbilical hernia (series 3/image 49).    Musculoskeletal: Visualized osseous structures are within normal  limits.   IMPRESSION:  3 mm gallstone 10 without associated inflammatory changes.   Tiny fat containing periumbilical hernia.   No evidence of bowel obstruction.  Normal appendix.    Electronically Signed    By: Charline Bills M.D.    On: 10/23/2020 01:55  Assessment:      Umbilical hernia with obstruction, without gangrene [K42.0]  Plan:    1. Umbilical hernia with obstruction, without gangrene [K42.0]   Discussed the risk of surgery including recurrence, which can be up to 50% in the case of incisional or complex hernias, possible use of prosthetic materials (mesh) and the increased risk of mesh infxn if used, bleeding, chronic pain, post-op infxn, post-op SBO or ileus, and possible re-operation to address said risks. The risks of general anesthetic, if used, includes MI, CVA, sudden death or even reaction to anesthetic medications also discussed. Alternatives include continued observation.  Benefits include possible symptom relief, prevention of incarceration, strangulation, enlargement in size over time, and the risk of emergency surgery in the face of strangulation.   Typical post-op recovery time of 3-5 days with 2 weeks of activity restrictions were also discussed.  ED return precautions given for sudden increase in pain, size of hernia with accompanying fever, nausea, and/or vomiting.  The patient verbalized understanding and all questions were answered to the patient's satisfaction.   2. Patient has elected to proceed with surgical treatment. Procedure will be scheduled. Open, due to small size.  Out of work for two weeks post op due to strenuous activity needed.  labs/images/medications/previous chart entries reviewed personally and relevant changes/updates noted above.

## 2022-10-11 DIAGNOSIS — K429 Umbilical hernia without obstruction or gangrene: Secondary | ICD-10-CM

## 2022-10-11 HISTORY — DX: Umbilical hernia without obstruction or gangrene: K42.9

## 2022-10-18 ENCOUNTER — Telehealth: Payer: Managed Care, Other (non HMO) | Admitting: Nurse Practitioner

## 2022-10-18 DIAGNOSIS — L03011 Cellulitis of right finger: Secondary | ICD-10-CM

## 2022-10-18 MED ORDER — DOXYCYCLINE HYCLATE 100 MG PO TABS: 100.0000 mg | ORAL_TABLET | Freq: Two times a day (BID) | ORAL | 0 refills | Status: AC

## 2022-10-18 NOTE — Progress Notes (Signed)
Virtual Visit Consent   Billy Morris, you are scheduled for a virtual visit with a [facility] provider today. Just as with appointments in the office, your consent must be obtained to participate. Your consent will be active for this visit and any virtual visit you may have with one of our providers in the next 365 days. If you have a MyChart account, a copy of this consent can be sent to you electronically.  As this is a virtual visit, video technology does not allow for your provider to perform a traditional examination. This may limit your provider's ability to fully assess your condition. If your provider identifies any concerns that need to be evaluated in person or the need to arrange testing (such as labs, EKG, etc.), we will make arrangements to do so. Although advances in technology are sophisticated, we cannot ensure that it will always work on either your end or our end. If the connection with a video visit is poor, the visit may have to be switched to a telephone visit. With either a video or telephone visit, we are not always able to ensure that we have a secure connection.  By engaging in this virtual visit, you consent to the provision of healthcare and authorize for your insurance to be billed (if applicable) for the services provided during this visit. Depending on your insurance coverage, you may receive a charge related to this service.  I need to obtain your verbal consent now. Are you willing to proceed with your visit today? Billy Morris has provided verbal consent on 10/18/2022 for a virtual visit (video or telephone). Billy Rigg, NP  Date: 10/18/2022 12:45 PM  Virtual Visit via Video Note   I, Billy Morris, connected with  Billy Morris  ([id number], 1970-06-01) on 10/18/22 at 12:45 PM EDT by a video-enabled telemedicine application and verified that I am speaking with the correct person using two identifiers.  Location: Patient: Virtual Visit Location Patient:  Home Provider: Virtual Visit Location Provider: Home Office   I discussed the limitations of evaluation and management by telemedicine and the availability of in person appointments. The patient expressed understanding and agreed to proceed.    History of Present Illness: Billy Morris is a 52 y.o. who identifies as a male who was assigned male at birth, and is being seen today for right thumb cellulitis.  Billy Morris states he has been experiencing redness, tenderness and purulent drainage from the right thumbnail. He believes he had gotten some fragments of sharp metal in his thumb as he does work in an environment with machinery, [employer], minerals.  He has been applying betadine and neosporin to the area over the past several days with some improvement however the area continues erythematous with purulent drainage expressed with pressing around the thumbnail.     Problems:  Patient Active Problem List   Diagnosis Date Noted   Muscle twitching 01/28/2022   Coronary atherosclerosis 01/14/2022   Obesity (BMI 30.0-34.9) 06/06/2021   Hypersomnolence 10/14/2020   Umbilical hernia without obstruction and without gangrene 10/14/2020   Elevated coronary artery calcium score 05/10/2020   B12 deficiency 05/10/2020   Vitamin D deficiency 05/10/2020   Hyperglycemia 05/10/2020   Encounter for well adult exam with abnormal findings 05/05/2019   Elevated blood pressure reading 05/05/2019   Mass of soft palate 12/08/2016   Dysphonia 09/17/2016   Laryngopharyngeal reflux (LPR) 09/17/2016   Mixed conductive and sensorineural hearing loss of left ear 06/19/2016  Right-sided sensorineural hearing loss 06/19/2016   Arthralgia of left temporomandibular joint 01/24/2016   Otosclerosis 01/24/2016   Swelling, mass, or lump on face 01/17/2016   Rash of neck 11/19/2015   Rash of back 11/19/2015   Groin rash 11/19/2015   Chest pain, atypical 08/14/2015   Carpal tunnel syndrome 01/31/2015   Acute upper  respiratory infection 03/15/2014   Right foot pain 11/01/2013   Eustachian tube dysfunction 10/03/2013   Anal pain 10/03/2013   Left lumbar radiculopathy 05/31/2013   Pruritus ani 11/08/2012   Referred otalgia, left 03/25/2010   HLD (hyperlipidemia) 09/25/2009   Anal fissure 09/25/2009   Hematochezia 09/25/2009   Anxiety state 10/06/2006   Hearing loss 10/06/2006   Allergic rhinitis 10/06/2006   Asthma 10/06/2006   GERD (gastroesophageal reflux disease) 10/06/2006   LOW BACK PAIN 10/06/2006    Allergies:  Allergies  Allergen Reactions   Beef (Bovine) Protein Hives    All red meat causes hives   Ciprofloxacin Other (See Comments) and Diarrhea    Bloody stool Bloody stool Stools have bloody mucous   Penicillins Rash   Sulfa Antibiotics Rash   Tiotropium Bromide Monohydrate Other (See Comments)    heart racing and paresthesias   Nabumetone     Other reaction(s): Other (See Comments) weakness weakness   Sulfonamide Derivatives    Medications:  Current Outpatient Medications:    doxycycline (VIBRA-TABS) 100 MG tablet, Take 1 tablet (100 mg total) by mouth 2 (two) times daily for 5 days., Disp: 10 tablet, Rfl: 0   albuterol (PROAIR HFA) 108 (90 Base) MCG/ACT inhaler, INHALE TWO PUFFS 4 TIMES DAILY AS NEEDED FOR SHORTNESS OF BREATH, Disp: 18 g, Rfl: 1   aspirin EC 81 MG tablet, Take 1 tablet (81 mg total) by mouth daily. Swallow whole., Disp: 90 tablet, Rfl: 3   Cholecalciferol (THERA-D 2000) 50 MCG (2000 UT) TABS, 1 tab by mouth once daily, Disp: 30 tablet, Rfl: 99   Cholecalciferol (VITAMIN D3 PO), Take by mouth., Disp: , Rfl:    EPINEPHrine (AUVI-Q) 0.3 mg/0.3 mL IJ SOAJ injection, Inject 0.3 mg into the muscle as needed for anaphylaxis., Disp: 1 each, Rfl: 1   fluticasone (FLONASE) 50 MCG/ACT nasal spray, 1 spray per nostril 1-2 times daily as needed., Disp: 16 g, Rfl: 5   ketoconazole (NIZORAL) 2 % cream, Apply 1 application topically daily., Disp: 30 g, Rfl: 1    lansoprazole (PREVACID) 30 MG capsule, TAKE 1 CAPSULE BY MOUTH 1-2 TIMES DAILY, Disp: 60 capsule, Rfl: 5   levocetirizine (XYZAL) 5 MG tablet, TAKE 1 TABLET BY MOUTH EVERY DAY IN THE EVENING AS NEEDED., Disp: 30 tablet, Rfl: 5   meloxicam (MOBIC) 15 MG tablet, TAKE ONE TABLET BY MOUTH ONCE DAILY AS NEEDED, Disp: 30 tablet, Rfl: 0   nystatin (MYCOSTATIN/NYSTOP) powder, Use as directed twice per day to affected area as needed, Disp: 30 g, Rfl: 2   QVAR REDIHALER 80 MCG/ACT inhaler, 1 puff 1-2 times daily depending on disease activity., Disp: 10.6 g, Rfl: 4   rosuvastatin (CRESTOR) 20 MG tablet, Take 1 tablet (20 mg total) by mouth daily., Disp: 90 tablet, Rfl: 3   vitamin B-12 (CYANOCOBALAMIN) 1000 MCG tablet, Take 1 tablet (1,000 mcg total) by mouth daily., Disp: 90 tablet, Rfl: 3  Observations/Objective: Patient is well-developed, well-nourished in no acute distress.  Resting comfortably at home.  Head is normocephalic, atraumatic.  No labored breathing.  Speech is clear and coherent with logical content.  Patient is alert and  oriented at baseline.    Assessment and Plan: 1. Cellulitis of finger of right hand - doxycycline (VIBRA-TABS) 100 MG tablet; Take 1 tablet (100 mg total) by mouth 2 (two) times daily for 5 days.  Dispense: 10 tablet; Refill: 0   Follow Up Instructions: I discussed the assessment and treatment plan with the patient. The patient was provided an opportunity to ask questions and all were answered. The patient agreed with the plan and demonstrated an understanding of the instructions.  A copy of instructions were sent to the patient via MyChart unless otherwise noted below.    The patient was advised to call back or seek an in-person evaluation if the symptoms worsen or if the condition fails to improve as anticipated.  Time:  I spent 12 minutes with the patient via telehealth technology discussing the above problems/concerns.    Billy Rigg, NP

## 2022-10-18 NOTE — Patient Instructions (Signed)
Lovie Macadamia, thank you for joining Claiborne Rigg, NP for today's virtual visit.  While this provider is not your primary care provider (PCP), if your PCP is located in our provider database this encounter information will be shared with them immediately following your visit.   A [facility] MyChart account gives you access to today's visit and all your visits, tests, and labs performed at [facility] " click here if you don't have a [facility] MyChart account or go to [email or web address]  Consent: (Patient) Lovie Macadamia provided verbal consent for this virtual visit at the beginning of the encounter.  Current Medications:  Current Outpatient Medications:    doxycycline (VIBRA-TABS) 100 MG tablet, Take 1 tablet (100 mg total) by mouth 2 (two) times daily for 5 days., Disp: 10 tablet, Rfl: 0   albuterol (PROAIR HFA) 108 (90 Base) MCG/ACT inhaler, INHALE TWO PUFFS 4 TIMES DAILY AS NEEDED FOR SHORTNESS OF BREATH, Disp: 18 g, Rfl: 1   aspirin EC 81 MG tablet, Take 1 tablet (81 mg total) by mouth daily. Swallow whole., Disp: 90 tablet, Rfl: 3   Cholecalciferol (THERA-D 2000) 50 MCG (2000 UT) TABS, 1 tab by mouth once daily, Disp: 30 tablet, Rfl: 99   Cholecalciferol (VITAMIN D3 PO), Take by mouth., Disp: , Rfl:    EPINEPHrine (AUVI-Q) 0.3 mg/0.3 mL IJ SOAJ injection, Inject 0.3 mg into the muscle as needed for anaphylaxis., Disp: 1 each, Rfl: 1   fluticasone (FLONASE) 50 MCG/ACT nasal spray, 1 spray per nostril 1-2 times daily as needed., Disp: 16 g, Rfl: 5   ketoconazole (NIZORAL) 2 % cream, Apply 1 application topically daily., Disp: 30 g, Rfl: 1   lansoprazole (PREVACID) 30 MG capsule, TAKE 1 CAPSULE BY MOUTH 1-2 TIMES DAILY, Disp: 60 capsule, Rfl: 5   levocetirizine (XYZAL) 5 MG tablet, TAKE 1 TABLET BY MOUTH EVERY DAY IN THE EVENING AS NEEDED., Disp: 30 tablet, Rfl: 5   meloxicam (MOBIC) 15 MG tablet, TAKE ONE TABLET BY MOUTH ONCE DAILY AS NEEDED, Disp: 30 tablet, Rfl: 0    nystatin (MYCOSTATIN/NYSTOP) powder, Use as directed twice per day to affected area as needed, Disp: 30 g, Rfl: 2   QVAR REDIHALER 80 MCG/ACT inhaler, 1 puff 1-2 times daily depending on disease activity., Disp: 10.6 g, Rfl: 4   rosuvastatin (CRESTOR) 20 MG tablet, Take 1 tablet (20 mg total) by mouth daily., Disp: 90 tablet, Rfl: 3   vitamin B-12 (CYANOCOBALAMIN) 1000 MCG tablet, Take 1 tablet (1,000 mcg total) by mouth daily., Disp: 90 tablet, Rfl: 3   Medications ordered in this encounter:  Meds ordered this encounter  Medications   doxycycline (VIBRA-TABS) 100 MG tablet    Sig: Take 1 tablet (100 mg total) by mouth 2 (two) times daily for 5 days.    Dispense:  10 tablet    Refill:  0    Order Specific Question:   Supervising Provider    Answer:   Merrilee Jansky [id number]     *If you need refills on other medications prior to your next appointment, please contact your pharmacy*  Follow-Up: Call back or seek an in-person evaluation if the symptoms worsen or if the condition fails to improve as anticipated.  [facility] [phone]   If you have been instructed to have an in-person evaluation today at a local Urgent Care facility, please use the link below. It will take you to a list of all of our available [facility]  Urgent Cares, including address, phone number and hours of operation. Please do not delay care.  [facility]  If you or a family member do not have a primary care provider, use the link below to schedule a visit and establish care. When you choose a [facility] primary care physician or advanced practice provider, you gain a long-term partner in health. Find a Primary Care Provider  Learn more about [facility]'s in-office and virtual care options: [facility] - Get Care Now

## 2022-10-29 ENCOUNTER — Encounter
Admission: RE | Admit: 2022-10-29 | Discharge: 2022-10-29 | Disposition: A | Discharge status: Home / Self Care | Payer: Managed Care, Other (non HMO) | Source: Ambulatory Visit | Attending: Surgery | Admitting: Surgery

## 2022-10-29 ENCOUNTER — Other Ambulatory Visit: Payer: Self-pay

## 2022-10-29 ENCOUNTER — Telehealth: Payer: Self-pay | Admitting: *Deleted

## 2022-10-29 VITALS — Ht 66.0 in | Wt 149.0 lb

## 2022-10-29 DIAGNOSIS — Z01812 Encounter for preprocedural laboratory examination: Secondary | ICD-10-CM

## 2022-10-29 DIAGNOSIS — R931 Abnormal findings on diagnostic imaging of heart and coronary circulation: Secondary | ICD-10-CM

## 2022-10-29 DIAGNOSIS — E782 Mixed hyperlipidemia: Secondary | ICD-10-CM

## 2022-10-29 DIAGNOSIS — R030 Elevated blood-pressure reading, without diagnosis of hypertension: Secondary | ICD-10-CM

## 2022-10-29 DIAGNOSIS — R0789 Other chest pain: Secondary | ICD-10-CM

## 2022-10-29 HISTORY — DX: Vitamin D deficiency, unspecified: E55.9

## 2022-10-29 HISTORY — DX: Carpal tunnel syndrome, unspecified upper limb: G56.00

## 2022-10-29 HISTORY — DX: Deficiency of other specified B group vitamins: E53.8

## 2022-10-29 HISTORY — DX: Other complications of anesthesia, initial encounter: T88.59XA

## 2022-10-29 HISTORY — DX: Atherosclerotic heart disease of native coronary artery without angina pectoris: I25.10

## 2022-10-29 HISTORY — DX: Other chest pain: R07.89

## 2022-10-29 NOTE — Telephone Encounter (Signed)
Name: Billy Morris  DOB: [birth year]  MRN: [id number]  Primary Cardiologist: None   Preoperative team, please contact this patient and set up a phone call appointment for further preoperative risk assessment. Please obtain consent and complete medication review. Thank you for your help.  **Okay to schedule patient in provider slot on Tuesday 11/03/2022. Please ask patient to stop aspirin tomorrow.**  I confirm that guidance regarding antiplatelet and oral anticoagulation therapy has been completed and, if necessary, noted below.  Ideally aspirin should be continued without interruption, however if the bleeding risk is too great, aspirin may be held for 5-7 days prior to surgery. Please resume aspirin post operatively when it is felt to be safe from a bleeding standpoint.     Carlos Levering, NP 10/29/2022, 5:17 PM [facility]

## 2022-10-29 NOTE — Patient Instructions (Addendum)
Your procedure is scheduled on: Friday, September 27 Report to the Registration Desk on the 1st floor of the [facility] To find out your arrival time, please call [phone] between 1PM - 3PM on: Thursday, September 26 If your arrival time is 6:00 am, do not arrive before that time as the [facility] entrance doors do not open until 6:00 am.  REMEMBER: Instructions that are not followed completely may result in serious medical risk, up to and including death; or upon the discretion of your surgeon and anesthesiologist your surgery may need to be rescheduled.  Do not eat food after midnight the night before surgery.  No gum chewing or hard candies.  You may however, drink CLEAR liquids up to 2 hours before you are scheduled to arrive for your surgery. Do not drink anything within 2 hours of your scheduled arrival time.  Clear liquids include: - water  - apple juice without pulp - gatorade (not RED colors) - black coffee or tea (Do NOT add milk or creamers to the coffee or tea) Do NOT drink anything that is not on this list.  One week prior to surgery: starting September 20 Stop meloxicam, aspirin and Anti-inflammatories (NSAIDS) such as Advil, Aleve, Ibuprofen, Motrin, Naproxen, Naprosyn and Aspirin based products such as Excedrin, Goody's Powder, BC Powder. Stop ANY OVER THE COUNTER supplements until after surgery. You may however, continue to take Tylenol if needed for pain up until the day of surgery.  Continue taking all prescribed medications   TAKE ONLY THESE MEDICATIONS THE MORNING OF SURGERY WITH A SIP OF WATER:  QVAR inhaler Rosuvastatin (Crestor) Lansoprazole (Prevacid) - (take one the night before and one on the morning of surgery - helps to prevent nausea after surgery.)  Use inhalers on the day of surgery and bring your albuterol inhaler to the hospital and use before surgery.  No Alcohol for 24 hours before or after surgery.  No Smoking including e-cigarettes  for 24 hours before surgery.  No chewable tobacco products for at least 6 hours before surgery.  No nicotine patches on the day of surgery.  Do not use any "recreational" drugs for at least a week (preferably 2 weeks) before your surgery.  Please be advised that the combination of cocaine and anesthesia may have negative outcomes, up to and including death. If you test positive for cocaine, your surgery will be cancelled.  On the morning of surgery brush your teeth with toothpaste and water, you may rinse your mouth with mouthwash if you wish. Do not swallow any toothpaste or mouthwash.  Use CHG Soap as directed on instruction sheet.  Do not wear jewelry, make-up, hairpins, clips or nail polish.  For welded (permanent) jewelry: bracelets, anklets, waist bands, etc.  Please have this removed prior to surgery.  If it is not removed, there is a chance that hospital personnel will need to cut it off on the day of surgery.  Do not wear lotions, powders, or perfumes.   Do not shave body hair from the neck down 48 hours before surgery.  Contact lenses, hearing aids and dentures may not be worn into surgery.  Do not bring valuables to the hospital. [facility] is not responsible for any missing/lost belongings or valuables.   Notify your doctor if there is any change in your medical condition (cold, fever, infection).  Wear comfortable clothing (specific to your surgery type) to the hospital.  After surgery, you can help prevent lung complications by doing breathing exercises.  Take deep breaths and cough every 1-2 hours. Your doctor may order a device called an Incentive Spirometer to help you take deep breaths. When coughing or sneezing, hold a pillow firmly against your incision with both hands. This is called "splinting." Doing this helps protect your incision. It also decreases belly discomfort.  If you are being discharged the day of surgery, you will not be allowed to drive home. You  will need a responsible individual to drive you home and stay with you for 24 hours after surgery.   If you are taking public transportation, you will need to have a responsible individual with you.  Please call the Pre-admissions Testing Dept. at [phone] if you have any questions about these instructions.  Surgery Visitation Policy:  Patients having surgery or a procedure may have two visitors.  Children under the age of 64 must have an adult with them who is not the patient.     Preparing for Surgery with CHLORHEXIDINE GLUCONATE (CHG) Soap  Chlorhexidine Gluconate (CHG) Soap  o An antiseptic cleaner that kills germs and bonds with the skin to continue killing germs even after washing  o Used for showering the night before surgery and morning of surgery  Before surgery, you can play an important role by reducing the number of germs on your skin.  CHG (Chlorhexidine gluconate) soap is an antiseptic cleanser which kills germs and bonds with the skin to continue killing germs even after washing.  Please do not use if you have an allergy to CHG or antibacterial soaps. If your skin becomes reddened/irritated stop using the CHG.  1. Shower the NIGHT BEFORE SURGERY and the MORNING OF SURGERY with CHG soap.  2. If you choose to wash your hair, wash your hair first as usual with your normal shampoo.  3. After shampooing, rinse your hair and body thoroughly to remove the shampoo.  4. Use CHG as you would any other liquid soap. You can apply CHG directly to the skin and wash gently with a scrungie or a clean washcloth.  5. Apply the CHG soap to your body only from the neck down. Do not use on open wounds or open sores. Avoid contact with your eyes, ears, mouth, and genitals (private parts). Wash face and genitals (private parts) with your normal soap.  6. Wash thoroughly, paying special attention to the area where your surgery will be performed.  7. Thoroughly rinse your body with  warm water.  8. Do not shower/wash with your normal soap after using and rinsing off the CHG soap.  9. Pat yourself dry with a clean towel.  10. Wear clean pajamas to bed the night before surgery.  12. Place clean sheets on your bed the night of your first shower and do not sleep with pets.  13. Shower again with the CHG soap on the day of surgery prior to arriving at the hospital.  14. Do not apply any deodorants/lotions/powders.  15. Please wear clean clothes to the hospital.

## 2022-10-29 NOTE — Telephone Encounter (Signed)
Pre-operative Risk Assessment    Patient Name: Billy Morris  DOB: [birth year] MRN: [id number]  LAST OV:05-2022 NA[facility]ER  NEXT OV: 05-2023  NA[facility]   Request for pre-operative cardiac clearance:    1. What type of surgery is being performed?  HERNIA REPAIR UMBILICAL ADULT POSSIBLY WITH MESH   2. When is this surgery scheduled?  11/06/2022    3. Type of clearance being requested (medical, pharmacy, both)?  MEDICAL    4. Are there any medications that need to be held prior to surgery?  ASA   5. Practice name and name of physician performing surgery?  Performing surgeon: Dr. Sung Amabile, DO  Requesting clearance: Quentin Mulling, FNP-C     6. Anesthesia type (none, local, MAC, general)?  GENERAL   7. What is the office phone and fax number?   Phone: [phone]  Fax: [phone]  Signed, Oleta Mouse   10/29/2022, 4:59 PM

## 2022-10-29 NOTE — Telephone Encounter (Signed)
-----   Message from Billy Morris sent at 10/29/2022  4:32 PM EDT ----- Will you please create this as a telephone encounter so it can be documented on appropriately.  Thank you!  DW ----- Message ----- From: Oleta Mouse, CMA Sent: 10/29/2022   4:16 PM EDT To: [facility] Preop  Request for pre-operative cardiac clearance:  1. What type of surgery is being performed?  HERNIA REPAIR UMBILICAL ADULT POSSIBLY WITH MESH  2. When is this surgery scheduled?  11/06/2022  3. Type of clearance being requested (medical, pharmacy, both)? MEDICAL   4. Are there any medications that need to be held prior to surgery? ASA  5. Practice name and name of physician performing surgery?  Performing surgeon: Dr. Sung Amabile, DO Requesting clearance: Quentin Mulling, FNP-C    6. Anesthesia type (none, local, MAC, general)? GENERAL  7. What is the office phone and fax number?   Phone: [phone] Fax: [phone]  ATTENTION: Unable to create telephone message as per your standard workflow. Directed by [facility] providers to send requests for cardiac clearance to this pool for appropriate distribution to provider covering pre-operative clearances.   Quentin Mulling, MSN, APRN, FNP-C, CEN [facility]  Peri-operative Services Nurse Practitioner Phone: [phone] 10/29/22 1:06 PM

## 2022-10-30 ENCOUNTER — Encounter: Payer: Self-pay | Admitting: Internal Medicine

## 2022-10-30 ENCOUNTER — Telehealth: Payer: Self-pay | Admitting: *Deleted

## 2022-10-30 ENCOUNTER — Encounter
Admission: RE | Admit: 2022-10-30 | Discharge: 2022-10-30 | Disposition: A | Discharge status: Home / Self Care | Payer: Managed Care, Other (non HMO) | Source: Ambulatory Visit | Attending: Surgery | Admitting: Surgery

## 2022-10-30 DIAGNOSIS — R0789 Other chest pain: Secondary | ICD-10-CM | POA: Insufficient documentation

## 2022-10-30 DIAGNOSIS — Z01818 Encounter for other preprocedural examination: Secondary | ICD-10-CM | POA: Insufficient documentation

## 2022-10-30 DIAGNOSIS — E782 Mixed hyperlipidemia: Secondary | ICD-10-CM | POA: Diagnosis not present

## 2022-10-30 DIAGNOSIS — R931 Abnormal findings on diagnostic imaging of heart and coronary circulation: Secondary | ICD-10-CM | POA: Diagnosis not present

## 2022-10-30 DIAGNOSIS — Z01812 Encounter for preprocedural laboratory examination: Secondary | ICD-10-CM

## 2022-10-30 DIAGNOSIS — R030 Elevated blood-pressure reading, without diagnosis of hypertension: Secondary | ICD-10-CM | POA: Diagnosis not present

## 2022-10-30 LAB — CBC
HCT: 37.2 % — ABNORMAL LOW (ref 39.0–52.0)
Hemoglobin: 13.2 g/dL (ref 13.0–17.0)
MCH: 32.5 pg (ref 26.0–34.0)
MCHC: 35.5 g/dL (ref 30.0–36.0)
MCV: 91.6 fL (ref 80.0–100.0)
Platelets: 202 10*3/uL (ref 150–400)
RBC: 4.06 MIL/uL — ABNORMAL LOW (ref 4.22–5.81)
RDW: 11.8 % (ref 11.5–15.5)
WBC: 4.9 10*3/uL (ref 4.0–10.5)
nRBC: 0 % (ref 0.0–0.2)

## 2022-10-30 LAB — BASIC METABOLIC PANEL
Anion gap: 8 (ref 5–15)
BUN: 21 mg/dL — ABNORMAL HIGH (ref 6–20)
CO2: 26 mmol/L (ref 22–32)
Calcium: 8.6 mg/dL — ABNORMAL LOW (ref 8.9–10.3)
Chloride: 106 mmol/L (ref 98–111)
Creatinine, Ser: 0.79 mg/dL (ref 0.61–1.24)
GFR, Estimated: >60 mL/min (ref >60)
Glucose, Bld: 102 mg/dL — ABNORMAL HIGH (ref 70–99)
Potassium: 3.7 mmol/L (ref 3.5–5.1)
Sodium: 140 mmol/L (ref 135–145)

## 2022-10-30 NOTE — Telephone Encounter (Signed)
Patient aware and scheduled 9-24

## 2022-10-30 NOTE — Telephone Encounter (Signed)
  Patient Consent for Virtual Visit       Billy Morris has provided verbal consent on 10/30/2022 for a virtual visit (video or telephone).   CONSENT FOR VIRTUAL VISIT FOR:  Billy Morris  By participating in this virtual visit I agree to the following:  I hereby voluntarily request, consent and authorize [facility] and its employed or contracted physicians, physician assistants, nurse practitioners or other licensed health care professionals (the Practitioner), to provide me with telemedicine health care services (the "Services") as deemed necessary by the treating Practitioner. I acknowledge and consent to receive the Services by the Practitioner via telemedicine. I understand that the telemedicine visit will involve communicating with the Practitioner through live audiovisual communication technology and the disclosure of certain medical information by electronic transmission. I acknowledge that I have been given the opportunity to request an in-person assessment or other available alternative prior to the telemedicine visit and am voluntarily participating in the telemedicine visit.  I understand that I have the right to withhold or withdraw my consent to the use of telemedicine in the course of my care at any time, without affecting my right to future care or treatment, and that the Practitioner or I may terminate the telemedicine visit at any time. I understand that I have the right to inspect all information obtained and/or recorded in the course of the telemedicine visit and may receive copies of available information for a reasonable fee.  I understand that some of the potential risks of receiving the Services via telemedicine include:  Delay or interruption in medical evaluation due to technological equipment failure or disruption; Information transmitted may not be sufficient (e.g. poor resolution of images) to allow for appropriate medical decision making by the Practitioner; and/or   In rare instances, security protocols could fail, causing a breach of personal health information.  Furthermore, I acknowledge that it is my responsibility to provide information about my medical history, conditions and care that is complete and accurate to the best of my ability. I acknowledge that Practitioner's advice, recommendations, and/or decision may be based on factors not within their control, such as incomplete or inaccurate data provided by me or distortions of diagnostic images or specimens that may result from electronic transmissions. I understand that the practice of medicine is not an exact science and that Practitioner makes no warranties or guarantees regarding treatment outcomes. I acknowledge that a copy of this consent can be made available to me via my patient portal [facility] MyChart), or I can request a printed copy by calling the office of [facility].    I understand that my insurance will be billed for this visit.   I have read or had this consent read to me. I understand the contents of this consent, which adequately explains the benefits and risks of the Services being provided via telemedicine.  I have been provided ample opportunity to ask questions regarding this consent and the Services and have had my questions answered to my satisfaction. I give my informed consent for the services to be provided through the use of telemedicine in my medical care

## 2022-11-02 ENCOUNTER — Encounter: Payer: Self-pay | Admitting: Surgery

## 2022-11-02 NOTE — Progress Notes (Signed)
Perioperative / Anesthesia Services  Pre-Admission Testing Clinical Review / Pre-Operative Anesthesia Consult  Date: 11/03/22  Patient Demographics:  Name: Billy Morris DOB:   23-Jul-1970 MRN:   [id number]  Planned Surgical Procedure(s):    Case: [id number] Date/Time: 11/06/22 0715   Procedure: HERNIA REPAIR UMBILICAL ADULT  possibly w/ mesh (Abdomen)   Anesthesia type: General   Pre-op diagnosis: umbilical hernia w/ obstruction, w/o gangrene K42.0   Location: [facility] OR ROOM 04 / [facility]   Surgeons: Sung Amabile, DO     NOTE: Available PAT nursing documentation and vital signs have been reviewed. Clinical nursing staff has updated patient's PMH/PSHx, current medication list, and drug allergies/intolerances to ensure comprehensive history available to assist in medical decision making as it pertains to the aforementioned surgical procedure and anticipated anesthetic course. Extensive review of available clinical information personally performed. [facility] PMH and PSHx updated with any diagnoses/procedures that  may have been inadvertently omitted during his intake with the pre-admission testing department's nursing staff.  Clinical Discussion:  Billy Morris is a 52 y.o. male who is submitted for pre-surgical anesthesia review and clearance prior to him undergoing the above procedure. Patient has never been a smoker. Pertinent PMH includes: CAD, atypical chest pain, HLD, COPD, asthma, GERD (on daily PPI), hiatal hernia, umbilical hernia, chronic lower back pain with radiculopathy, anxiety.  Patient is followed by cardiology Elease Hashimoto, MD). He was last seen in the cardiology clinic on 05/22/2022; notes reviewed. At the time of his clinic visit, patient doing well overall from a cardiovascular perspective.  Patient did complain of some pain in his chest, subscapular area, and in his left jaw associated with moving a "heavy grill".  Patient with varying degrees of chronic dyspnea  related to his underlying COPD diagnosis.  Patient denied any orthopnea, palpitations, significant peripheral edema, weakness, fatigue, vertiginous symptoms, or presyncope/syncope. Patient with a past medical history significant for cardiovascular diagnoses. Documented physical exam was grossly benign, providing no evidence of acute exacerbation and/or decompensation of the patient's known cardiovascular conditions.  Coronary CTA was performed on 09/01/2019 that demonstrated an Agatston coronary artery calcium score of 50. This placed patient in the 84th percentile for age, sex, and race matched controls. Calcium depositions noted to be isolated mainly in the proximal LAD distribution.  Study demonstrated normal coronary origin with RIGHT-sided dominance.  Repeat coronary CTA was performed on 02/27/2022 that demonstrated an Agatston coronary artery calcium score of 67.1. This placed patient in the 83rd percentile for age, sex, and race matched controls. Calcium depositions noted to be isolated mainly in the ostial LAD distribution. Study demonstrated normal coronary origin with RIGHT-sided dominance.  Blood pressure well controlled at 124/78 mmHg without the use of pharmacological intervention patient is on rosuvastatin for his HLD diagnosis and ASCVD prevention.  Patient is not diabetic. Patient does not have an OSAH diagnosis.  Patient has no formal exercise regimen, however he works as a Insurance account manager, which requires lots of manual labor.  Patient is able to complete all of his ADLs/IADLs independently without significant cardiovascular limitation.  Per the [facility], patient is able to exceed 4 METS of physical activity without experiencing any significant degrees of angina/anginal equivalent symptoms.  No changes were made to his medication regimen.  Patient to follow-up with outpatient cardiology in 1 year or sooner if needed.  Billy Morris is scheduled for an elective HERNIA REPAIR UMBILICAL ADULT   POSSIBLY W/ MESH on 11/06/2022 with Dr. Milford Cage  Tonna Boehringer, DO.  Given patient's past medical history significant for cardiovascular diagnoses, presurgical cardiac clearance was sought by the PAT team.  Per cardiology, "according to the RCRI, patient has a 0.4% risk of MACE. Patient reports activity equivalent to >4.0 METS (heavy lifting, pushing, pulling and cardio exercise). Given past medical history and time since last visit, based on ACC/AHA guidelines, Billy Morris would be at acceptable risk for the planned procedure without further cardiovascular testing".  In review of his medication reconciliation, it is noted that patient is currently on prescribed daily antithrombotic therapy. He has been instructed on recommendations for holding his daily low-dose ASA for 7 days prior to his procedure with plans to restart as soon as postoperative bleeding risk felt to be minimized by his attending surgeon. The patient has been instructed that his last dose of his ASA should be on 10/29/2022.  Patient reports previous perioperative complications with anesthesia in the past.  He reports (+) delayed emergence from general anesthesia.  Patient states "I do not need much anesthesia".  In review of the available records, it is noted that patient underwent a general anesthetic course at [facility] in 08/2012 without documented complications.      10/29/2022   11:54 AM 06/19/2022    2:10 PM 05/22/2022    8:04 AM  Vitals with BMI  Height 5\' 6"  5\' 6"  5\' 6"   Weight 149 lbs 170 lbs 173 lbs  BMI 24.06 27.45 27.94  Systolic  132 124  Diastolic  78 78  Pulse  67 70    Providers/Specialists:   NOTE: Primary physician provider listed below. Patient may have been seen by APP or partner within same practice.   PROVIDER ROLE / SPECIALTY LAST Michelle Nasuti, DO General Surgery (Surgeon) 10/05/2022  Corwin Levins, MD Primary Care Provider 06/19/2022  Kristeen Miss Cardiology  05/22/2022; update preop APP call on 11/03/2022   Allergies:  Beef (bovine) protein, Ciprofloxacin, Penicillins, Sulfa antibiotics, Tiotropium bromide monohydrate, Nabumetone, and Sulfonamide derivatives  Current Home Medications:   No current facility-administered medications for this encounter.    albuterol (PROAIR HFA) 108 (90 Base) MCG/ACT inhaler   aspirin EC 81 MG tablet   Cholecalciferol (VITAMIN D3 PO)   EPINEPHrine (AUVI-Q) 0.3 mg/0.3 mL IJ SOAJ injection   fluticasone (FLONASE) 50 MCG/ACT nasal spray   lansoprazole (PREVACID) 30 MG capsule   levocetirizine (XYZAL) 5 MG tablet   meloxicam (MOBIC) 15 MG tablet   QVAR REDIHALER 80 MCG/ACT inhaler   rosuvastatin (CRESTOR) 20 MG tablet   vitamin B-12 (CYANOCOBALAMIN) 1000 MCG tablet   History:   Past Medical History:  Diagnosis Date   Allergic rhinitis    Anal fissure    Anxiety    Asthma    Atypical chest pain    CAD (coronary artery disease) 09/01/2019   a.) cCTA 09/01/2022: Ca2+ 50 (84th %'ile for age/sex/race match control); isolated to pLAD territory; b.) cCTA 02/27/2022: Ca2+ 67.1 (83rd (84th %'ile for age/sex/race match control); isolated to oLAD territory   Carpal tunnel syndrome    Complication of anesthesia    a.) delayed emergence; "I dont need much anesthesia"   COPD (chronic obstructive pulmonary disease) (HCC)    Coronary atherosclerosis    Diverticulosis    GERD (gastroesophageal reflux disease)    H. pylori infection    Hearing loss, right    Hiatal hernia    Hyperlipidemia    LBP (low back pain)  TMJ (dislocation of temporomandibular joint)    Umbilical hernia 10/2022   Vitamin B 12 deficiency    Vitamin D deficiency    Past Surgical History:  Procedure Laterality Date   COLONOSCOPY     laser retinopexy Right 2015   STAPEDECTOMY Right    UPPER GI ENDOSCOPY     VASECTOMY     Family History  Problem Relation Age of Onset   Other Mother        ibs   Hyperlipidemia Mother     Hyperlipidemia Father    Atrial fibrillation Father    Heart attack Maternal Grandmother    Diabetes Maternal Grandmother    COPD Maternal Grandfather    Prostate cancer Maternal Grandfather    Colon polyps Maternal Grandfather    Ovarian cancer Paternal Grandmother    Asthma Son    Irritable bowel syndrome Maternal Aunt        x4   Stroke Maternal Aunt    Diabetes Maternal Aunt    Diabetes Paternal Aunt    Allergies Other        children   Heart failure Other        paternal great grandmother   Allergic rhinitis Neg Hx    Angioedema Neg Hx    Atopy Neg Hx    Eczema Neg Hx    Immunodeficiency Neg Hx    Urticaria Neg Hx    Social History   Tobacco Use   Smoking status: Never   Smokeless tobacco: Never  Vaping Use   Vaping status: Never Used  Substance Use Topics   Alcohol use: No   Drug use: No    Pertinent Clinical Results:  LABS:   No visits with results within 3 Day(s) from this visit.  Latest known visit with results is:  Hospital Outpatient Visit on 10/30/2022  Component Date Value Ref Range Status   Sodium 10/30/2022 140  135 - 145 mmol/L Final   Potassium 10/30/2022 3.7  3.5 - 5.1 mmol/L Final   Chloride 10/30/2022 106  98 - 111 mmol/L Final   CO2 10/30/2022 26  22 - 32 mmol/L Final   Glucose, Bld 10/30/2022 102 (H)  70 - 99 mg/dL Final   Glucose reference range applies only to samples taken after fasting for at least 8 hours.   BUN 10/30/2022 21 (H)  6 - 20 mg/dL Final   Creatinine, Ser 10/30/2022 0.79  0.61 - 1.24 mg/dL Final   Calcium 72/53/6644 8.6 (L)  8.9 - 10.3 mg/dL Final   GFR, Estimated 10/30/2022 >60  >60 mL/min Final   Comment: (NOTE) Calculated using the CKD-EPI Creatinine Equation (2021)    Anion gap 10/30/2022 8  5 - 15 Final   Performed at [facility], [address] [place], [place] [address]   WBC 10/30/2022 4.9  4.0 - 10.5 K/uL Final   RBC 10/30/2022 4.06 (L)  4.22 - 5.81 MIL/uL Final   Hemoglobin 10/30/2022 13.2  13.0  - 17.0 g/dL Final   HCT 25/95/6387 37.2 (L)  39.0 - 52.0 % Final   MCV 10/30/2022 91.6  80.0 - 100.0 fL Final   MCH 10/30/2022 32.5  26.0 - 34.0 pg Final   MCHC 10/30/2022 35.5  30.0 - 36.0 g/dL Final   RDW 56/43/3295 11.8  11.5 - 15.5 % Final   Platelets 10/30/2022 202  150 - 400 K/uL Final   nRBC 10/30/2022 0.0  0.0 - 0.2 % Final   Performed at [facility],  [address], [place], [place] [address]    ECG: Date: 10/30/2022 Time ECG obtained: 1135 AM Rate: 67 bpm Rhythm:  Normal sinus rhythm with sinus arrhythmia Axis (leads I and aVF): Normal Intervals: PR 148 ms. QRS 88 ms. QTc 435 ms. ST segment and T wave changes: No evidence of acute ST segment elevation or depression.  Comparison: Similar to previous tracing obtained on 02/23/2022   IMAGING / PROCEDURES: CT CORONARY MORPH W/CTA COR W/SCORE W/CA W/CM &/OR WO/CM performed on Mild mixed non-obstructive CAD, CADRADS = 2. Coronary calcium score of 67.1. This was 83rd percentile for age and sex matched control. Normal coronary origin with right dominance. Aggressive secondary cardiac risk factor modification is recommended.  CT ABDOMEN PELVIS W CONTRAST performed on 10/23/2020 3 mm gallstone 10 without associated inflammatory changes. Tiny fat containing periumbilical hernia. No evidence of bowel obstruction.   Normal appendix.  Impression and Plan:  Billy Morris has been referred for pre-anesthesia review and clearance prior to him undergoing the planned anesthetic and procedural courses. Available labs, pertinent testing, and imaging results were personally reviewed by me in preparation for upcoming operative/procedural course. [facility] medical record has been updated following extensive record review and patient interview with PAT staff.   This patient has been appropriately cleared by cardiology with an overall ACCEPTABLE risk of experiencing significant perioperative cardiovascular complications. Based on  clinical review performed today (11/03/22), barring any significant acute changes in the patient's overall condition, it is anticipated that he will be able to proceed with the planned surgical intervention. Any acute changes in clinical condition may necessitate his procedure being postponed and/or cancelled. Patient will meet with anesthesia team (MD and/or CRNA) on the day of his procedure for preoperative evaluation/assessment. Questions regarding anesthetic course will be fielded at that time.   Pre-surgical instructions were reviewed with the patient during his PAT appointment, and questions were fielded to satisfaction by PAT clinical staff. He has been instructed on which medications that he will need to hold prior to surgery, as well as the ones that have been deemed safe/appropriate to take on the day of his procedure. As part of the general education provided by PAT, patient made aware both verbally and in writing, that he would need to abstain from the use of any illegal substances during his perioperative course.  He was advised that failure to follow the provided instructions could necessitate case cancellation or result in serious perioperative complications up to and including death. Patient encouraged to contact PAT and/or his surgeon's office to discuss any questions or concerns that may arise prior to surgery; verbalized understanding.   Quentin Mulling, MSN, APRN, FNP-C, [facility]  Perioperative Services Nurse Practitioner Phone: [phone] Fax: [phone] 11/03/22 3:33 PM  NOTE: This note has been prepared using Dragon dictation software. Despite my best ability to proofread, there is always the potential that unintentional transcriptional errors may still occur from this process.

## 2022-11-03 ENCOUNTER — Ambulatory Visit: Payer: Managed Care, Other (non HMO) | Attending: Cardiology | Admitting: Student

## 2022-11-03 DIAGNOSIS — Z01810 Encounter for preprocedural cardiovascular examination: Secondary | ICD-10-CM | POA: Diagnosis not present

## 2022-11-03 NOTE — Progress Notes (Signed)
Virtual Visit via Telephone Note   Because of Billy Morris's co-morbid illnesses, he is at least at moderate risk for complications without adequate follow up.  This format is felt to be most appropriate for this patient at this time.  The patient did not have access to video technology/had technical difficulties with video requiring transitioning to audio format only (telephone).  All issues noted in this document were discussed and addressed.  No physical exam could be performed with this format.  Please refer to the patient's chart for his consent to telehealth for [facility]  Evaluation Performed:  Preoperative cardiovascular risk assessment _____________   Date:  11/03/2022   Patient ID:  Billy Morris, DOB [birth year], MRN [id number] Patient Location:  Home Provider location:   Office  Primary Care Provider:  Corwin Levins, MD Primary Cardiologist:  Kristeen Miss, MD  Chief Complaint / Patient Profile   52 y.o. y/o male with a h/o nonobstructive CAD per coronary CTA, hyperlipidemia, asthma, GERD who is pending umbilical hernia repair with possible mesh by Dr. Tonna Boehringer and presents today for telephonic preoperative cardiovascular risk assessment.  History of Present Illness    Billy Morris is a 52 y.o. male who presents via audio/video conferencing for a telehealth visit today.  Pt was last seen in cardiology clinic on 05/22/2022 by Dr. Elease Hashimoto.  At that time PRATHIK MAGOUIRK was stable from a cardiac standpoint.  The patient is now pending procedure as outlined above. Since his last visit, he is doing well. Patient denies shortness of breath, dyspnea on exertion, lower extremity edema, orthopnea or PND. No chest pain, pressure, or tightness. No palpitations. He has been unable to do formal exercise for the last month secondary to his hernia. 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   Past Medical History    Past Medical History:   Diagnosis Date   Allergic rhinitis    Anal fissure    Anxiety    Asthma    Atypical chest pain    CAD (coronary artery disease) 09/01/2019   a.) cCTA 09/01/2022: Ca2+ 50 (84th %'ile for age/sex/race match control); isolated to pLAD territory; b.) cCTA 02/27/2022: Ca2+ 67.1 (83rd (84th %'ile for age/sex/race match control); isolated to oLAD territory   Carpal tunnel syndrome    Complication of anesthesia    a.) delayed emergence; "I dont need much anesthesia"   COPD (chronic obstructive pulmonary disease) (HCC)    Coronary atherosclerosis    Diverticulosis    GERD (gastroesophageal reflux disease)    H. pylori infection    Hearing loss, right    Hiatal hernia    Hyperlipidemia    LBP (low back pain)    TMJ (dislocation of temporomandibular joint)    Umbilical hernia 10/2022   Vitamin B 12 deficiency    Vitamin D deficiency    Past Surgical History:  Procedure Laterality Date   COLONOSCOPY     laser retinopexy Right 2015   STAPEDECTOMY Right    UPPER GI ENDOSCOPY     VASECTOMY      Allergies  Allergies  Allergen Reactions   Beef (Bovine) Protein Hives    All red meat causes hives   Ciprofloxacin Other (See Comments) and Diarrhea    Bloody stool Bloody stool Stools have bloody mucous   Penicillins Rash   Sulfa Antibiotics Rash   Tiotropium Bromide Monohydrate Other (See Comments)    heart racing and paresthesias  Nabumetone     weakness   Sulfonamide Derivatives     Home Medications    Prior to Admission medications   Medication Sig Start Date End Date Taking? Authorizing Provider  albuterol (PROAIR HFA) 108 (90 Base) MCG/ACT inhaler INHALE TWO PUFFS 4 TIMES DAILY AS NEEDED FOR SHORTNESS OF BREATH 03/10/22   Kozlow, Alvira Philips, MD  aspirin EC 81 MG tablet Take 1 tablet (81 mg total) by mouth daily. Swallow whole. 02/23/22   Nahser, Deloris Ping, MD  Cholecalciferol (VITAMIN D3 PO) Take 1 tablet by mouth daily.    [provider]  EPINEPHrine (AUVI-Q) 0.3  mg/0.3 mL IJ SOAJ injection Inject 0.3 mg into the muscle as needed for anaphylaxis. 03/10/22   Kozlow, Alvira Philips, MD  fluticasone (FLONASE) 50 MCG/ACT nasal spray 1 spray per nostril 1-2 times daily as needed. 03/10/22   Kozlow, Alvira Philips, MD  lansoprazole (PREVACID) 30 MG capsule Take 30 mg by mouth daily.    [provider]  levocetirizine (XYZAL) 5 MG tablet Take 5 mg by mouth daily as needed for allergies.    [provider]  meloxicam (MOBIC) 15 MG tablet Take 15 mg by mouth daily as needed for pain.    [provider]  QVAR REDIHALER 80 MCG/ACT inhaler 1 puff 1-2 times daily depending on disease activity. 03/10/22   Kozlow, Alvira Philips, MD  rosuvastatin (CRESTOR) 20 MG tablet Take 1 tablet (20 mg total) by mouth daily. 03/02/22   Nahser, Deloris Ping, MD  vitamin B-12 (CYANOCOBALAMIN) 1000 MCG tablet Take 1 tablet (1,000 mcg total) by mouth daily. 05/06/19   Corwin Levins, MD    Physical Exam    Vital Signs:  Lovie Macadamia does not have vital signs available for review today.  Given telephonic nature of communication, physical exam is limited. AAOx3. NAD. Normal affect.  Speech and respirations are unlabored.  Accessory Clinical Findings    None  Assessment & Plan    Primary Cardiologist: Kristeen Miss, MD  Preoperative cardiovascular risk assessment. Umbilical hernia repair with possible mesh by Dr. Tonna Boehringer on 11/06/2022.  Chart reviewed as part of pre-operative protocol coverage. According to the RCRI, patient has a 0.4% risk of MACE. Patient reports activity equivalent to >4.0 METS (heavy lifting, pushing, pulling and cardio exercise).   Given past medical history and time since last visit, based on ACC/AHA guidelines, COTTON POIROT would be at acceptable risk for the planned procedure without further cardiovascular testing.   Patient was advised that if he develops new symptoms prior to surgery to contact our office to arrange a follow-up appointment.  he verbalized  understanding.  Per office protocol, he may hold aspirin for 7 days prior to procedure and should resume as soon as hemodynamically stable postoperatively.   I will route this recommendation to the requesting party via Epic fax function.  Please call with questions.  Time:   Today, I have spent 10 minutes with the patient with telehealth technology discussing medical history, symptoms, and management plan.     Carlos Levering, NP  11/03/2022, 8:25 AM

## 2022-11-05 MED ORDER — CHLORHEXIDINE GLUCONATE CLOTH 2 % EX PADS
6.0000 | MEDICATED_PAD | Freq: Once | CUTANEOUS | Status: AC
  Administered 2022-11-06: 6 via TOPICAL

## 2022-11-05 MED ORDER — LACTATED RINGERS IV SOLN: INTRAVENOUS | Status: DC

## 2022-11-05 MED ORDER — ACETAMINOPHEN 500 MG PO TABS
1000.0000 mg | ORAL_TABLET | ORAL | Status: AC
  Administered 2022-11-06: 1000 mg via ORAL

## 2022-11-05 MED ORDER — ORAL CARE MOUTH RINSE: 15.0000 mL | Freq: Once | OROMUCOSAL | Status: AC

## 2022-11-05 MED ORDER — CHLORHEXIDINE GLUCONATE 0.12 % MT SOLN
15.0000 mL | Freq: Once | OROMUCOSAL | Status: AC
  Administered 2022-11-06: 15 mL via OROMUCOSAL

## 2022-11-05 MED ORDER — CEFAZOLIN SODIUM-DEXTROSE 2-4 GM/100ML-% IV SOLN
2.0000 g | INTRAVENOUS | Status: AC
  Administered 2022-11-06: 2 g via INTRAVENOUS

## 2022-11-05 MED ORDER — GABAPENTIN 300 MG PO CAPS
300.0000 mg | ORAL_CAPSULE | ORAL | Status: AC
  Administered 2022-11-06: 300 mg via ORAL

## 2022-11-06 ENCOUNTER — Ambulatory Visit: Payer: Managed Care, Other (non HMO) | Admitting: Urgent Care

## 2022-11-06 ENCOUNTER — Other Ambulatory Visit: Payer: Self-pay

## 2022-11-06 ENCOUNTER — Ambulatory Visit
Admission: RE | Admit: 2022-11-06 | Discharge: 2022-11-06 | Disposition: A | Discharge status: Home / Self Care | Payer: Managed Care, Other (non HMO) | Attending: Surgery | Admitting: Surgery

## 2022-11-06 ENCOUNTER — Encounter
Admission: RE | Disposition: A | Discharge status: Home / Self Care | Payer: Self-pay | Source: Home / Self Care | Attending: Surgery

## 2022-11-06 ENCOUNTER — Encounter: Payer: Self-pay | Admitting: Surgery

## 2022-11-06 DIAGNOSIS — J45909 Unspecified asthma, uncomplicated: Secondary | ICD-10-CM | POA: Insufficient documentation

## 2022-11-06 DIAGNOSIS — E785 Hyperlipidemia, unspecified: Secondary | ICD-10-CM | POA: Insufficient documentation

## 2022-11-06 DIAGNOSIS — Z7951 Long term (current) use of inhaled steroids: Secondary | ICD-10-CM | POA: Diagnosis not present

## 2022-11-06 DIAGNOSIS — K219 Gastro-esophageal reflux disease without esophagitis: Secondary | ICD-10-CM | POA: Diagnosis not present

## 2022-11-06 DIAGNOSIS — K420 Umbilical hernia with obstruction, without gangrene: Secondary | ICD-10-CM | POA: Diagnosis present

## 2022-11-06 DIAGNOSIS — I2510 Atherosclerotic heart disease of native coronary artery without angina pectoris: Secondary | ICD-10-CM | POA: Diagnosis not present

## 2022-11-06 DIAGNOSIS — K429 Umbilical hernia without obstruction or gangrene: Secondary | ICD-10-CM

## 2022-11-06 HISTORY — PX: UMBILICAL HERNIA REPAIR: SHX196

## 2022-11-06 HISTORY — DX: Unspecified hearing loss, right ear: H91.91

## 2022-11-06 SURGERY — REPAIR, HERNIA, UMBILICAL, ADULT
Anesthesia: General | Site: Abdomen

## 2022-11-06 MED ORDER — MIDAZOLAM HCL 2 MG/2ML IJ SOLN
INTRAMUSCULAR | Status: AC
  Filled 2022-11-06: qty 2

## 2022-11-06 MED ORDER — PROPOFOL 10 MG/ML IV BOLUS
INTRAVENOUS | Status: DC | PRN
Start: 2022-11-06 — End: 2022-11-06
  Administered 2022-11-06: 170 mg via INTRAVENOUS

## 2022-11-06 MED ORDER — CEFAZOLIN SODIUM-DEXTROSE 2-4 GM/100ML-% IV SOLN
INTRAVENOUS | Status: AC
  Filled 2022-11-06: qty 100

## 2022-11-06 MED ORDER — BUPIVACAINE LIPOSOME 1.3 % IJ SUSP
INTRAMUSCULAR | Status: AC
  Filled 2022-11-06: qty 20

## 2022-11-06 MED ORDER — SUGAMMADEX SODIUM 200 MG/2ML IV SOLN
INTRAVENOUS | Status: DC | PRN
Start: 2022-11-06 — End: 2022-11-06
  Administered 2022-11-06: 200 mg via INTRAVENOUS

## 2022-11-06 MED ORDER — BUPIVACAINE LIPOSOME 1.3 % IJ SUSP
INTRAMUSCULAR | Status: DC | PRN
  Administered 2022-11-06: 20 mL

## 2022-11-06 MED ORDER — DIPHENHYDRAMINE HCL 50 MG/ML IJ SOLN
INTRAMUSCULAR | Status: DC | PRN
Start: 2022-11-06 — End: 2022-11-06
  Administered 2022-11-06: 12.5 mg via INTRAVENOUS

## 2022-11-06 MED ORDER — DEXAMETHASONE SODIUM PHOSPHATE 10 MG/ML IJ SOLN
INTRAMUSCULAR | Status: DC | PRN
  Administered 2022-11-06: 10 mg via INTRAVENOUS

## 2022-11-06 MED ORDER — BUPIVACAINE-EPINEPHRINE (PF) 0.5% -1:200000 IJ SOLN
INTRAMUSCULAR | Status: AC
  Filled 2022-11-06: qty 30

## 2022-11-06 MED ORDER — BUPIVACAINE-EPINEPHRINE (PF) 0.5% -1:200000 IJ SOLN
INTRAMUSCULAR | Status: DC | PRN
  Administered 2022-11-06: 30 mL

## 2022-11-06 MED ORDER — FENTANYL CITRATE (PF) 100 MCG/2ML IJ SOLN
INTRAMUSCULAR | Status: AC
  Filled 2022-11-06: qty 2

## 2022-11-06 MED ORDER — ACETAMINOPHEN 500 MG PO TABS
ORAL_TABLET | ORAL | Status: AC
  Filled 2022-11-06: qty 2

## 2022-11-06 MED ORDER — CHLORHEXIDINE GLUCONATE 0.12 % MT SOLN
OROMUCOSAL | Status: AC
  Filled 2022-11-06: qty 15

## 2022-11-06 MED ORDER — ONDANSETRON HCL 4 MG/2ML IJ SOLN
INTRAMUSCULAR | Status: DC | PRN
Start: 2022-11-06 — End: 2022-11-06
  Administered 2022-11-06: 4 mg via INTRAVENOUS

## 2022-11-06 MED ORDER — ROCURONIUM BROMIDE 100 MG/10ML IV SOLN
INTRAVENOUS | Status: DC | PRN
Start: 2022-11-06 — End: 2022-11-06
  Administered 2022-11-06: 50 mg via INTRAVENOUS
  Administered 2022-11-06: 20 mg via INTRAVENOUS

## 2022-11-06 MED ORDER — EPHEDRINE SULFATE-NACL 50-0.9 MG/10ML-% IV SOSY
PREFILLED_SYRINGE | INTRAVENOUS | Status: DC | PRN
Start: 2022-11-06 — End: 2022-11-06
  Administered 2022-11-06: 5 mg via INTRAVENOUS

## 2022-11-06 MED ORDER — OXYCODONE HCL 5 MG/5ML PO SOLN: 5.0000 mg | Freq: Once | ORAL | Status: DC | PRN

## 2022-11-06 MED ORDER — OXYCODONE HCL 5 MG PO TABS: 5.0000 mg | ORAL_TABLET | Freq: Once | ORAL | Status: DC | PRN

## 2022-11-06 MED ORDER — OXYCODONE-ACETAMINOPHEN 5-325 MG PO TABS
1.0000 | ORAL_TABLET | Freq: Three times a day (TID) | ORAL | 0 refills | Status: AC | PRN
Start: 2022-11-06 — End: 2023-11-06

## 2022-11-06 MED ORDER — FENTANYL CITRATE (PF) 100 MCG/2ML IJ SOLN: 25.0000 ug | INTRAMUSCULAR | Status: DC | PRN

## 2022-11-06 MED ORDER — MIDAZOLAM HCL 5 MG/5ML IJ SOLN
INTRAMUSCULAR | Status: DC | PRN
Start: 2022-11-06 — End: 2022-11-06
  Administered 2022-11-06: 2 mg via INTRAVENOUS

## 2022-11-06 MED ORDER — GABAPENTIN 300 MG PO CAPS
ORAL_CAPSULE | ORAL | Status: AC
  Filled 2022-11-06: qty 1

## 2022-11-06 MED ORDER — 0.9 % SODIUM CHLORIDE (POUR BTL) OPTIME
TOPICAL | Status: DC | PRN
  Administered 2022-11-06: 60 mL

## 2022-11-06 MED ORDER — FENTANYL CITRATE (PF) 100 MCG/2ML IJ SOLN
INTRAMUSCULAR | Status: DC | PRN
Start: 2022-11-06 — End: 2022-11-06
  Administered 2022-11-06: 100 ug via INTRAVENOUS

## 2022-11-06 MED ORDER — SEVOFLURANE IN SOLN
RESPIRATORY_TRACT | Status: AC
  Filled 2022-11-06: qty 250

## 2022-11-06 MED ORDER — LIDOCAINE HCL (CARDIAC) PF 100 MG/5ML IV SOSY
PREFILLED_SYRINGE | INTRAVENOUS | Status: DC | PRN
Start: 2022-11-06 — End: 2022-11-06
  Administered 2022-11-06: 100 mg via INTRAVENOUS

## 2022-11-06 MED ORDER — PROPOFOL 10 MG/ML IV BOLUS
INTRAVENOUS | Status: AC
  Filled 2022-11-06: qty 20

## 2022-11-06 MED ORDER — DIPHENHYDRAMINE HCL 50 MG/ML IJ SOLN
INTRAMUSCULAR | Status: AC
  Filled 2022-11-06: qty 1

## 2022-11-06 MED ORDER — DOCUSATE SODIUM 100 MG PO CAPS: 100.0000 mg | ORAL_CAPSULE | Freq: Two times a day (BID) | ORAL | 0 refills | Status: AC | PRN

## 2022-11-06 MED ORDER — ACETAMINOPHEN 325 MG PO TABS: 650.0000 mg | ORAL_TABLET | Freq: Three times a day (TID) | ORAL | 0 refills | Status: AC | PRN

## 2022-11-06 SURGICAL SUPPLY — 36 items
ADH SKN CLS APL DERMABOND .7 (GAUZE/BANDAGES/DRESSINGS) ×1
APL PRP STRL LF DISP 70% ISPRP (MISCELLANEOUS) ×1
BLADE SURG 15 STRL LF DISP TIS (BLADE) ×1 IMPLANT
BLADE SURG 15 STRL SS (BLADE) ×1
CHLORAPREP W/TINT 26 (MISCELLANEOUS) ×1 IMPLANT
DERMABOND ADVANCED .7 DNX12 (GAUZE/BANDAGES/DRESSINGS) ×1 IMPLANT
DRAPE LAPAROTOMY 77X122 PED (DRAPES) ×1 IMPLANT
ELECT REM PT RETURN 9FT ADLT (ELECTROSURGICAL) ×1
ELECTRODE REM PT RTRN 9FT ADLT (ELECTROSURGICAL) ×1 IMPLANT
GAUZE 4X4 16PLY ~~LOC~~+RFID DBL (SPONGE) ×1 IMPLANT
GLOVE BIOGEL PI IND STRL 7.0 (GLOVE) ×1 IMPLANT
GLOVE SURG SYN 6.5 ES PF (GLOVE) ×2 IMPLANT
GLOVE SURG SYN 6.5 PF PI (GLOVE) ×1 IMPLANT
GOWN STRL REUS W/ TWL LRG LVL3 (GOWN DISPOSABLE) ×3 IMPLANT
GOWN STRL REUS W/TWL LRG LVL3 (GOWN DISPOSABLE) ×2
KIT TURNOVER KIT A (KITS) ×1 IMPLANT
LABEL OR SOLS (LABEL) ×1 IMPLANT
MANIFOLD NEPTUNE II (INSTRUMENTS) ×1 IMPLANT
MESH VENTRALEX ST 1-7/10 CRC S (Mesh General) IMPLANT
MESH VENTRALEX ST 2.5 CRC MED (Mesh General) IMPLANT
NDL HYPO 22X1.5 SAFETY MO (MISCELLANEOUS) ×1 IMPLANT
NEEDLE HYPO 22X1.5 SAFETY MO (MISCELLANEOUS) ×1 IMPLANT
NS IRRIG 500ML POUR BTL (IV SOLUTION) ×1 IMPLANT
PACK BASIN MINOR ARMC (MISCELLANEOUS) ×2 IMPLANT
SUT ETHIBOND NAB MO 7 #0 18IN (SUTURE) ×2 IMPLANT
SUT MNCRL 4-0 (SUTURE) ×1
SUT MNCRL 4-0 27XMFL (SUTURE) ×1
SUT VIC AB 2-0 SH 27 (SUTURE) ×1
SUT VIC AB 2-0 SH 27XBRD (SUTURE) ×1 IMPLANT
SUT VIC AB 3-0 SH 27 (SUTURE) ×1
SUT VIC AB 3-0 SH 27X BRD (SUTURE) ×1 IMPLANT
SUTURE MNCRL 4-0 27XMF (SUTURE) ×1 IMPLANT
SYR 10ML LL (SYRINGE) ×2 IMPLANT
TOWEL OR 17X26 4PK STRL BLUE (TOWEL DISPOSABLE) ×1 IMPLANT
TRAP FLUID SMOKE EVACUATOR (MISCELLANEOUS) ×1 IMPLANT
WATER STERILE IRR 500ML POUR (IV SOLUTION) ×2 IMPLANT

## 2022-11-06 NOTE — Anesthesia Procedure Notes (Signed)
Procedure Name: Intubation Date/Time: 11/06/2022 7:49 AM  Performed by: Maryla Morrow., CRNAPre-anesthesia Checklist: Patient identified, Patient being monitored, Timeout performed, Emergency Drugs available and Suction available Patient Re-evaluated:Patient Re-evaluated prior to induction Oxygen Delivery Method: Circle system utilized Preoxygenation: Pre-oxygenation with 100% oxygen Induction Type: IV induction Ventilation: Mask ventilation without difficulty Laryngoscope Size: McGraph and 4 Grade View: Grade I Tube type: Oral Tube size: 7.5 mm Number of attempts: 1 Airway Equipment and Method: Stylet Placement Confirmation: ETT inserted through vocal cords under direct vision, positive ETCO2 and breath sounds checked- equal and bilateral Secured at: 21 cm Tube secured with: Tape Dental Injury: Teeth and Oropharynx as per pre-operative assessment

## 2022-11-06 NOTE — Anesthesia Postprocedure Evaluation (Signed)
Anesthesia Post Note  Patient: Billy Morris  Procedure(s) Performed: HERNIA REPAIR UMBILICAL ADULT  possibly w/ mesh (Abdomen)  Patient location during evaluation: PACU Anesthesia Type: General Level of consciousness: awake and alert Pain management: pain level controlled Vital Signs Assessment: post-procedure vital signs reviewed and stable Respiratory status: spontaneous breathing, nonlabored ventilation, respiratory function stable and patient connected to nasal cannula oxygen Cardiovascular status: blood pressure returned to baseline and stable Postop Assessment: no apparent nausea or vomiting Anesthetic complications: no   There were no known notable events for this encounter.   Last Vitals:  Vitals:   11/06/22 0945 11/06/22 1003  BP: 127/77 136/85  Pulse: 81 69  Resp: 15 15  Temp:  (!) 36.2 C  SpO2: 100% 100%    Last Pain:  Vitals:   11/06/22 1003  TempSrc: Temporal  PainSc: 0-No pain                 Lenard Simmer

## 2022-11-06 NOTE — Transfer of Care (Signed)
Immediate Anesthesia Transfer of Care Note  Patient: Billy Morris  Procedure(s) Performed: HERNIA REPAIR UMBILICAL ADULT  possibly w/ mesh (Abdomen)  Patient Location: PACU  Anesthesia Type:General  Level of Consciousness: drowsy and patient cooperative  Airway & Oxygen Therapy: Patient Spontanous Breathing and Patient connected to face mask oxygen  Post-op Assessment: Report given to RN and Post -op Vital signs reviewed and stable  Post vital signs: stable  Last Vitals:  Vitals Value Taken Time  BP 110/68 11/06/22 0847  Temp    Pulse 101 11/06/22 0850  Resp 26 11/06/22 0850  SpO2 100 % 11/06/22 0850  Vitals shown include unfiled device data.  Last Pain:  Vitals:   11/06/22 0622  TempSrc: Temporal  PainSc: 0-No pain         Complications: No notable events documented.

## 2022-11-06 NOTE — H&P (Signed)
Subjective:  CC: Umbilical hernia with obstruction, without gangrene [K42.0]  HPI:  Billy Morris is a 52 y.o. male who was referred by Corwin Levins, MD for evaluation of above. Symptoms were first noted several years ago. Pain is sharp and intermittent, confined to the periumbilical area, with recent worsening after wt loss.  Associated with nothing, exacerbated by touch, exertion.  Lump is not reducible.    Past Medical History:  has a past medical history of Anxiety, Asthma, unspecified asthma severity, unspecified whether complicated, unspecified whether persistent (HHS-HCC), GERD (gastroesophageal reflux disease), and Hyperlipidemia.  Past Surgical History:  Past Surgical History: Procedure Laterality Date  ear surgery'    VASECTOMY     Family History: family history includes High blood pressure (Hypertension) in his father; Hyperlipidemia (Elevated cholesterol) in his father, mother, and sister.  Social History:  reports that he has never smoked. He has never used smokeless tobacco. He reports that he does not drink alcohol and does not use drugs.  Current Medications: has a current medication list which includes the following prescription(s): albuterol mdi (proventil, ventolin, proair) hfa, aspirin, qvar redihaler, epinephrine, fluticasone propionate, lansoprazole, rosuvastatin, atorvastatin, citalopram, and levocetirizine.  Allergies:  Allergies as of 10/05/2022 - Reviewed 10/05/2022 Allergen Reaction Noted  Beef containing products Hives 07/06/2012  Bovine cartilage Hives 07/06/2012  Tiotropium Other (See Comments) 04/11/2010  Ciprofloxacin Diarrhea and Other (See Comments) 10/06/2006  Nabumetone Other (See Comments) 10/03/2013  Penicillins Rash 07/06/2012  Sulfa (sulfonamide antibiotics) Rash and Other (See Comments) 10/06/2006   ROS:  A 15 point review of systems was performed and pertinent positives and negatives noted in HPI   Objective:    BP 118/68   Pulse 67    Ht 167.6 cm (5\' 6" )   Wt 70.7 kg (155 lb 12.8 oz)   BMI 25.15 kg/m   Constitutional :  Alert, cooperative, no distress Lymphatics/Throat:  Supple, no lymphadenopathy Respiratory:  clear to auscultation bilaterally Cardiovascular:  regular rate and rhythm Gastrointestinal: soft, non-tender; bowel sounds normal; no masses,  no organomegaly. umbilical hernia noted.  small, incarcerated, no overlying skin changes, and no TTP Musculoskeletal: Steady gait and movement Skin: Cool and moist Psychiatric: Normal affect, non-agitated, not confused     LABS:  N/A   RADS: CLINICAL DATA:  Abdominal pain   EXAM:  CT ABDOMEN AND PELVIS WITH CONTRAST   TECHNIQUE:  Multidetector CT imaging of the abdomen and pelvis was performed  using the standard protocol following bolus administration of  intravenous contrast.   CONTRAST:  OMNIPAQUE IOHEXOL 350 MG/ML SOLN   COMPARISON:  03/27/2016   FINDINGS:  Lower chest: Lung bases are clear.   Hepatobiliary: Liver is within normal limits.   Gallbladder is notable for a 3 mm gallstone (series 3/image 31),  without associated inflammatory changes. No intrahepatic or  extrahepatic ductal dilatation.   Pancreas: Within normal limits.   Spleen: Within normal limits.   Adrenals/Urinary Tract: Adrenal glands are within normal limits.   Left kidney is notable for a subcentimeter cyst in the left lower  pole (series 3/image 42). Right kidney is within normal limits. No  hydronephrosis.   Bladder is within normal limits.   Stomach/Bowel: Stomach is within normal limits.   No evidence of bowel obstruction.   Normal appendix (series 3/image 44).   Vascular/Lymphatic: No evidence of abdominal aortic aneurysm.   No suspicious abdominopelvic lymphadenopathy.   Reproductive: Prostate is notable for dystrophic calcifications.   Other: No abdominopelvic ascites.  Tiny fat containing periumbilical hernia (series 3/image 49).    Musculoskeletal: Visualized osseous structures are within normal  limits.   IMPRESSION:  3 mm gallstone 10 without associated inflammatory changes.   Tiny fat containing periumbilical hernia.   No evidence of bowel obstruction.  Normal appendix.    Electronically Signed    By: Charline Bills M.D.    On: 10/23/2020 01:55  Assessment:      Umbilical hernia with obstruction, without gangrene [K42.0]  Plan:    1. Umbilical hernia with obstruction, without gangrene [K42.0]   Discussed the risk of surgery including recurrence, which can be up to 50% in the case of incisional or complex hernias, possible use of prosthetic materials (mesh) and the increased risk of mesh infxn if used, bleeding, chronic pain, post-op infxn, post-op SBO or ileus, and possible re-operation to address said risks. The risks of general anesthetic, if used, includes MI, CVA, sudden death or even reaction to anesthetic medications also discussed. Alternatives include continued observation.  Benefits include possible symptom relief, prevention of incarceration, strangulation, enlargement in size over time, and the risk of emergency surgery in the face of strangulation.   Typical post-op recovery time of 3-5 days with 2 weeks of activity restrictions were also discussed.  ED return precautions given for sudden increase in pain, size of hernia with accompanying fever, nausea, and/or vomiting.  The patient verbalized understanding and all questions were answered to the patient's satisfaction.   2. Patient has elected to proceed with surgical treatment. Procedure will be scheduled. Open, due to small size.  Out of work for two weeks post op due to strenuous activity needed.  labs/images/medications/previous chart entries reviewed personally and relevant changes/updates noted above.

## 2022-11-06 NOTE — Anesthesia Preprocedure Evaluation (Signed)
Anesthesia Evaluation  Patient identified by MRN, date of birth, ID band Patient awake    Reviewed: Allergy & Precautions, NPO status , Patient's Chart, lab work & pertinent test results  History of Anesthesia Complications (+) PROLONGED EMERGENCE and history of anesthetic complications  Airway Mallampati: III  TM Distance: >3 FB Neck ROM: full    Dental  (+) Chipped   Pulmonary neg shortness of breath, asthma , COPD   Pulmonary exam normal        Cardiovascular Exercise Tolerance: Good (-) angina + CAD  (-) Past MI Normal cardiovascular exam     Neuro/Psych  PSYCHIATRIC DISORDERS       Neuromuscular disease    GI/Hepatic Neg liver ROS, hiatal hernia,GERD  Controlled,,  Endo/Other  negative endocrine ROS    Renal/GU      Musculoskeletal   Abdominal   Peds  Hematology negative hematology ROS (+)   Anesthesia Other Findings Past Medical History: No date: Allergic rhinitis No date: Anal fissure No date: Anxiety No date: Asthma No date: Atypical chest pain 09/01/2019: CAD (coronary artery disease)     Comment:  a.) cCTA 09/01/2022: Ca2+ 50 (84th %'ile for               age/sex/race match control); isolated to pLAD territory;               b.) cCTA 02/27/2022: Ca2+ 67.1 (83rd (84th %'ile for               age/sex/race match control); isolated to oLAD territory No date: Carpal tunnel syndrome No date: Complication of anesthesia     Comment:  a.) delayed emergence; "I dont need much anesthesia" No date: COPD (chronic obstructive pulmonary disease) (HCC) No date: Coronary atherosclerosis No date: Diverticulosis No date: GERD (gastroesophageal reflux disease) No date: H. pylori infection No date: Hearing loss, right No date: Hiatal hernia No date: Hyperlipidemia No date: LBP (low back pain) No date: TMJ (dislocation of temporomandibular joint) 10/2022: Umbilical hernia No date: Vitamin B 12 deficiency No  date: Vitamin D deficiency  Past Surgical History: No date: COLONOSCOPY 2015: laser retinopexy; Right No date: STAPEDECTOMY; Right No date: UPPER GI ENDOSCOPY No date: VASECTOMY  BMI    Body Mass Index: 24.21 kg/m      Reproductive/Obstetrics negative OB ROS                             Anesthesia Physical Anesthesia Plan  ASA: 3  Anesthesia Plan: General ETT   Post-op Pain Management:    Induction: Intravenous  PONV Risk Score and Plan: Ondansetron, Dexamethasone, Midazolam and Treatment may vary due to age or medical condition  Airway Management Planned: Oral ETT  Additional Equipment:   Intra-op Plan:   Post-operative Plan: Extubation in OR  Informed Consent: I have reviewed the patients History and Physical, chart, labs and discussed the procedure including the risks, benefits and alternatives for the proposed anesthesia with the patient or authorized representative who has indicated his/her understanding and acceptance.     Dental Advisory Given  Plan Discussed with: Anesthesiologist, CRNA and Surgeon  Anesthesia Plan Comments: (Patient consented for risks of anesthesia including but not limited to:  - adverse reactions to medications - damage to eyes, teeth, lips or other oral mucosa - nerve damage due to positioning  - sore throat or hoarseness - Damage to heart, brain, nerves, lungs, other parts of body or loss of  life  Patient voiced understanding.)       Anesthesia Quick Evaluation

## 2022-11-06 NOTE — Op Note (Signed)
Preoperative diagnosis: umbilical hernia, reducible, initial Postoperative diagnosis: same  Procedure:  Open umbilical hernia repair  Anesthesia: LMA  Surgeon: Sung Amabile  Wound Classification: Clean  Specimen: none  Complications: None  Estimated Blood Loss: minimal  Indications:see HPI  Findings: 1cm x 1.3cm reducible umbilical hernia 2. Tension free repair achieved with suture 3. Adequate hemostasis  Description of procedure: The patient was brought to the operating room and general anesthesia was induced. A time-out was completed verifying correct patient, procedure, site, positioning, and implant(s) and/or special equipment prior to beginning this procedure. Antibiotics were administered prior to making the incision. SCDs placed. The anterior abdominal wall was prepped and draped in the standard sterile fashion.   An infraumbilical incision was made after infusing the preplanned incision with half percent Marcaine.  Dissection carried down to fascia where the umbilical stalk was noted.  The stalk was transected and there was noted to be a 1cm x 1.3cm umbilical hernia.  The preperitoneal fat contents were dissected off the surrounding structures and reduced.  Hemostasis was confirmed prior to reducing the actual contents.  The defect itself was primary closed using 0 Ethibond in an interrupted fashion.  The fascia as well as the skin incision was then infused with exparel.  After confirming hemostasis, the umbilical stalk was reattached to the abdominal wall using 2-0 Vicryl and the wound was irrigated and closed in a multilayer fashion, using 3-0 Vicryl for the deep dermal layer in an interrupted fashion and running 4-0 Monocryl in a subcuticular fashion.  Wound was then dressed with Dermabond.  Patient was then successfully awakened and transferred to PACU in stable condition.  At the end of the procedure sponge and instrument counts were correct

## 2022-11-06 NOTE — Interval H&P Note (Signed)
History and Physical Interval Note:  11/06/2022 7:05 AM  Billy Morris  has presented today for surgery, with the diagnosis of umbilical hernia w/ obstruction, w/o gangrene K42.0.  The various methods of treatment have been discussed with the patient and family. After consideration of risks, benefits and other options for treatment, the patient has consented to  Procedure(s): HERNIA REPAIR UMBILICAL ADULT  possibly w/ mesh (N/A) as a surgical intervention.  The patient's history has been reviewed, patient examined, no change in status, stable for surgery.  I have reviewed the patient's chart and labs.  Questions were answered to the patient's satisfaction.     Kariya Lavergne Tonna Boehringer

## 2022-11-06 NOTE — Discharge Instructions (Addendum)
Hernia repair, Care After This sheet gives you information about how to care for yourself after your procedure. Your health care provider may also give you more specific instructions. If you have problems or questions, contact your health care provider. What can I expect after the procedure? After your procedure, it is common to have the following: Pain in your abdomen, especially in the incision areas. You will be given medicine to control the pain. Tiredness. This is a normal part of the recovery process. Your energy level will return to normal over the next several weeks. Changes in your bowel movements, such as constipation or needing to go more often. Talk with your health care provider about how to manage this. Follow these instructions at home: Medicines  tylenol and mobic as needed for discomfort.  Please alternate between the two every four hours as needed for pain.    Use narcotics, if prescribed, only when tylenol and mobic is not enough to control pain.  325-650mg  every 8hrs to max of 3000mg /24hrs (including the 325mg  in every norco dose) for the tylenol.     PLEASE RECORD NUMBER OF PILLS TAKEN UNTIL NEXT FOLLOW UP APPT.  THIS WILL HELP DETERMINE HOW READY YOU ARE TO BE RELEASED FROM ANY ACTIVITY RESTRICTIONS Do not drive or use heavy machinery while taking prescription pain medicine. Do not drink alcohol while taking prescription pain medicine.  Incision care    Follow instructions from your health care provider about how to take care of your incision areas. Make sure you: Keep your incisions clean and dry. Wash your hands with soap and water before and after applying medicine to the areas, and before and after changing your bandage (dressing). If soap and water are not available, use hand sanitizer. Change your dressing as told by your health care provider. Leave stitches (sutures), skin glue, or adhesive strips in place. These skin closures may need to stay in place for 2 weeks  or longer. If adhesive strip edges start to loosen and curl up, you may trim the loose edges. Do not remove adhesive strips completely unless your health care provider tells you to do that. Do not wear tight clothing over the incisions. Tight clothing may rub and irritate the incision areas, which may cause the incisions to open. Do not take baths, swim, or use a hot tub until your health care provider approves. OK TO SHOWER IN 24HRS.   Check your incision area every day for signs of infection. Check for: More redness, swelling, or pain. More fluid or blood. Warmth. Pus or a bad smell. Activity Avoid lifting anything that is heavier than 10 lb (4.5 kg) for 2 weeks or until your health care provider says it is okay. No pushing/pulling greater than 30lbs You may resume normal activities as told by your health care provider. Ask your health care provider what activities are safe for you. Take rest breaks during the day as needed. Eating and drinking Follow instructions from your health care provider about what you can eat after surgery. To prevent or treat constipation while you are taking prescription pain medicine, your health care provider may recommend that you: Drink enough fluid to keep your urine clear or pale yellow. Take over-the-counter or prescription medicines. Eat foods that are high in fiber, such as fresh fruits and vegetables, whole grains, and beans. Limit foods that are high in fat and processed sugars, such as fried and sweet foods. General instructions Ask your health care provider when you will need  an appointment to get your sutures or staples removed. Keep all follow-up visits as told by your health care provider. This is important. Contact a health care provider if: You have more redness, swelling, or pain around your incisions. You have more fluid or blood coming from the incisions. Your incisions feel warm to the touch. You have pus or a bad smell coming from your  incisions or your dressing. You have a fever. You have an incision that breaks open (edges not staying together) after sutures or staples have been removed. You develop a rash. You have chest pain or difficulty breathing. You have pain or swelling in your legs. You feel light-headed or you faint. Your abdomen swells (becomes distended). You have nausea or vomiting. You have blood in your stool (feces). This information is not intended to replace advice given to you by your health care provider. Make sure you discuss any questions you have with your health care provider. Document Released: 08/15/2004 Document Revised: 10/15/2017 Document Reviewed: 10/28/2015 Elsevier Interactive Patient Education  2019 [employer]     AMBULATORY SURGERY  DISCHARGE INSTRUCTIONS   The drugs that you were given will stay in your system until tomorrow so for the next 24 hours you should not:  Drive an automobile Make any legal decisions Drink any alcoholic beverage   You may resume regular meals tomorrow.  Today it is better to start with liquids and gradually work up to solid foods.  You may eat anything you prefer, but it is better to start with liquids, then soup and crackers, and gradually work up to solid foods.   Please notify your doctor immediately if you have any unusual bleeding, trouble breathing, redness and pain at the surgery site, drainage, fever, or pain not relieved by medication.    Additional Instructions:  PLEASE LEAVE EXPAREL (TEAL) ARMBAND ON FOR 4 DAYS    Please contact your physician with any problems or Same Day Surgery at [phone], Monday through Friday 6 am to 4 pm, or [place] [facility] number at [phone].

## 2022-12-16 ENCOUNTER — Other Ambulatory Visit (INDEPENDENT_AMBULATORY_CARE_PROVIDER_SITE_OTHER): Payer: Managed Care, Other (non HMO)

## 2022-12-16 DIAGNOSIS — E559 Vitamin D deficiency, unspecified: Secondary | ICD-10-CM

## 2022-12-16 DIAGNOSIS — E538 Deficiency of other specified B group vitamins: Secondary | ICD-10-CM | POA: Diagnosis not present

## 2022-12-16 DIAGNOSIS — Z125 Encounter for screening for malignant neoplasm of prostate: Secondary | ICD-10-CM

## 2022-12-16 DIAGNOSIS — E7800 Pure hypercholesterolemia, unspecified: Secondary | ICD-10-CM | POA: Diagnosis not present

## 2022-12-16 DIAGNOSIS — R739 Hyperglycemia, unspecified: Secondary | ICD-10-CM | POA: Diagnosis not present

## 2022-12-16 LAB — HEPATIC FUNCTION PANEL
ALT: 29 U/L (ref 0–53)
AST: 20 U/L (ref 0–37)
Albumin: 4.4 g/dL (ref 3.5–5.2)
Alkaline Phosphatase: 87 U/L (ref 39–117)
Bilirubin, Direct: 0.2 mg/dL (ref 0.0–0.3)
Total Bilirubin: 1 mg/dL (ref 0.2–1.2)
Total Protein: 6.5 g/dL (ref 6.0–8.3)

## 2022-12-16 LAB — URINALYSIS, ROUTINE W REFLEX MICROSCOPIC
Bilirubin Urine: NEGATIVE
Hgb urine dipstick: NEGATIVE
Ketones, ur: NEGATIVE
Leukocytes,Ua: NEGATIVE
Nitrite: NEGATIVE
RBC / HPF: NONE SEEN (ref 0–?)
Specific Gravity, Urine: 1.02 (ref 1.000–1.030)
Total Protein, Urine: NEGATIVE
Urine Glucose: NEGATIVE
Urobilinogen, UA: 0.2 (ref 0.0–1.0)
WBC, UA: NONE SEEN (ref 0–?)
pH: 7 (ref 5.0–8.0)

## 2022-12-16 LAB — BASIC METABOLIC PANEL
BUN: 14 mg/dL (ref 6–23)
CO2: 28 meq/L (ref 19–32)
Calcium: 9.5 mg/dL (ref 8.4–10.5)
Chloride: 105 meq/L (ref 96–112)
Creatinine, Ser: 0.8 mg/dL (ref 0.40–1.50)
GFR: 101.83 mL/min (ref >60.00)
Glucose, Bld: 88 mg/dL (ref 70–99)
Potassium: 4.6 meq/L (ref 3.5–5.1)
Sodium: 140 meq/L (ref 135–145)

## 2022-12-16 LAB — CBC WITH DIFFERENTIAL/PLATELET
Basophils Absolute: 0 10*3/uL (ref 0.0–0.1)
Basophils Relative: 0.9 % (ref 0.0–3.0)
Eosinophils Absolute: 0.1 10*3/uL (ref 0.0–0.7)
Eosinophils Relative: 2 % (ref 0.0–5.0)
HCT: 40 % (ref 39.0–52.0)
Hemoglobin: 13.5 g/dL (ref 13.0–17.0)
Lymphocytes Relative: 32.8 % (ref 12.0–46.0)
Lymphs Abs: 1.5 10*3/uL (ref 0.7–4.0)
MCHC: 33.8 g/dL (ref 30.0–36.0)
MCV: 94.5 fL (ref 78.0–100.0)
Monocytes Absolute: 0.5 10*3/uL (ref 0.1–1.0)
Monocytes Relative: 10.3 % (ref 3.0–12.0)
Neutro Abs: 2.5 10*3/uL (ref 1.4–7.7)
Neutrophils Relative %: 54 % (ref 43.0–77.0)
Platelets: 222 10*3/uL (ref 150.0–400.0)
RBC: 4.23 Mil/uL (ref 4.22–5.81)
RDW: 12.2 % (ref 11.5–15.5)
WBC: 4.6 10*3/uL (ref 4.0–10.5)

## 2022-12-16 LAB — LIPID PANEL
Cholesterol: 112 mg/dL (ref 0–200)
HDL: 54.4 mg/dL (ref >39.00)
LDL Cholesterol: 47 mg/dL (ref 0–99)
NonHDL: 57.33
Total CHOL/HDL Ratio: 2
Triglycerides: 52 mg/dL (ref 0.0–149.0)
VLDL: 10.4 mg/dL (ref 0.0–40.0)

## 2022-12-16 LAB — HEMOGLOBIN A1C: Hgb A1c MFr Bld: 5.3 % (ref 4.6–6.5)

## 2022-12-16 LAB — VITAMIN D 25 HYDROXY (VIT D DEFICIENCY, FRACTURES): VITD: 43.95 ng/mL (ref 30.00–100.00)

## 2022-12-16 LAB — PSA: PSA: 0.45 ng/mL (ref 0.10–4.00)

## 2022-12-16 LAB — VITAMIN B12: Vitamin B-12: 598 pg/mL (ref 211–911)

## 2022-12-16 LAB — TSH: TSH: 1.66 u[IU]/mL (ref 0.35–5.50)

## 2022-12-16 NOTE — Progress Notes (Signed)
The test results show that your current treatment is OK, as the tests are stable.  Please continue the same plan.  There is no other need for change of treatment or further evaluation based on these results, at this time.  thanks 

## 2023-01-04 ENCOUNTER — Encounter: Payer: Self-pay | Admitting: Internal Medicine

## 2023-01-04 DIAGNOSIS — R300 Dysuria: Secondary | ICD-10-CM

## 2023-01-05 ENCOUNTER — Other Ambulatory Visit (INDEPENDENT_AMBULATORY_CARE_PROVIDER_SITE_OTHER): Payer: Managed Care, Other (non HMO)

## 2023-01-05 DIAGNOSIS — R300 Dysuria: Secondary | ICD-10-CM

## 2023-01-05 LAB — URINALYSIS, ROUTINE W REFLEX MICROSCOPIC
Bilirubin Urine: NEGATIVE
Hgb urine dipstick: NEGATIVE
Ketones, ur: NEGATIVE
Leukocytes,Ua: NEGATIVE
Nitrite: NEGATIVE
RBC / HPF: NONE SEEN (ref 0–?)
Specific Gravity, Urine: 1.02 (ref 1.000–1.030)
Total Protein, Urine: NEGATIVE
Urine Glucose: NEGATIVE
Urobilinogen, UA: 0.2 (ref 0.0–1.0)
pH: 6.5 (ref 5.0–8.0)

## 2023-01-06 LAB — URINE CULTURE: Result:: NO GROWTH

## 2023-01-20 ENCOUNTER — Encounter: Payer: Self-pay | Admitting: Internal Medicine

## 2023-02-25 ENCOUNTER — Other Ambulatory Visit: Payer: Self-pay | Admitting: Cardiovascular Disease

## 2023-03-05 ENCOUNTER — Encounter: Payer: Self-pay | Admitting: Internal Medicine

## 2023-03-20 ENCOUNTER — Other Ambulatory Visit: Payer: Self-pay | Admitting: Allergy and Immunology

## 2023-03-23 ENCOUNTER — Ambulatory Visit: Payer: Managed Care, Other (non HMO) | Admitting: Allergy and Immunology

## 2023-03-23 DIAGNOSIS — J3089 Other allergic rhinitis: Secondary | ICD-10-CM

## 2023-03-23 DIAGNOSIS — J301 Allergic rhinitis due to pollen: Secondary | ICD-10-CM

## 2023-03-23 DIAGNOSIS — J4540 Moderate persistent asthma, uncomplicated: Secondary | ICD-10-CM

## 2023-03-23 DIAGNOSIS — K219 Gastro-esophageal reflux disease without esophagitis: Secondary | ICD-10-CM

## 2023-03-23 DIAGNOSIS — T7800XD Anaphylactic reaction due to unspecified food, subsequent encounter: Secondary | ICD-10-CM

## 2023-03-23 MED ORDER — QVAR REDIHALER 80 MCG/ACT IN AERB: INHALATION_SPRAY | RESPIRATORY_TRACT | 3 refills | Status: AC

## 2023-03-23 MED ORDER — ALBUTEROL SULFATE HFA 108 (90 BASE) MCG/ACT IN AERS: 2.0000 | INHALATION_SPRAY | RESPIRATORY_TRACT | 2 refills | Status: AC | PRN

## 2023-03-23 MED ORDER — EPINEPHRINE 0.3 MG/0.3ML IJ SOAJ: 0.3000 mg | INTRAMUSCULAR | 2 refills | Status: AC | PRN

## 2023-03-23 NOTE — Patient Instructions (Signed)
  1.  Continue to perform allergen avoidance measures - beef, dust mite, pollen, cat, dog, mold  2.  Continue to treat and prevent inflammation:   A.  Flonase - 1 spray each nostril 1-2 times per day depending on disease activity  B.  Qvar 80 -2 inhalations 1-2 times per day depending on disease activity  3.  Continue to treat and prevent reflux:   A.  Prevacid 30 mg -1 tablet 1-2 times a day  B.  Continue to minimize caffeine and chocolate consumption  4. If needed:   A.  Pro-air HFA -2 inhalations every 4-6 hours  B.  Xyzal 5 mg -1 tablet daily  C.  Auvi-Q 0.3 / Epi-Pen, Benadryl, MD/ER evaluation for allergic reaction  5.  "Action Plan" for flare up:   A.  Increase Qvar to 3 inhalations 3 times per day  B.  Albuterol HFA if needed.   6.  Return to clinic in 12 months or earlier if problem  7. Influenza = Tamiflu. Covid = Paxlovid

## 2023-03-23 NOTE — Progress Notes (Unsigned)
[place] [facility] [place] [facility] [place] - [place] [facility] [place]   Follow-up Note  Referring Provider: Corwin Levins, MD Primary Provider: Corwin Levins, MD Date of Office Visit: 03/23/2023  Subjective:   Billy Morris (DOB: 1970-05-04) is a 53 y.o. male who [facility] in re-evaluation of the following:  HPI: Nathon returns to this clinic in evaluation of asthma, allergic rhinitis, LPR, and food allergy directed against mammal.  I last saw him in this clinic 10 March 2022.  He had an excellent year regarding his airway without the need for systemic steroid or an antibiotic and rare use of a short acting bronchodilator while he continues on Qvar mostly 1 time per day and nasal fluticasone 1 time per day.  He can exercise without any difficulty.  He has lost approximately 40 pounds of weight which has made him healthy in general.  He has had very little problems with reflux while using a proton pump inhibitor.  He remains away from beef consumption at this point although he can eat pork without any problem.  He did have the flu vaccine this year.  Allergies as of 03/23/2023       Reactions   Beef (bovine) Protein Hives   All red meat causes hives   Ciprofloxacin Other (See Comments), Diarrhea   Bloody stool Bloody stool Stools have bloody mucous   Penicillins Rash   Sulfa Antibiotics Rash   Tiotropium Bromide Monohydrate Other (See Comments)   heart racing and paresthesias   Nabumetone    weakness   Sulfonamide Derivatives         Medication List    albuterol 108 (90 Base) MCG/ACT inhaler Commonly known as: ProAir HFA INHALE TWO PUFFS 4 TIMES DAILY AS NEEDED FOR SHORTNESS OF BREATH   aspirin EC 81 MG tablet Take 1 tablet (81 mg total) by mouth daily. Swallow whole.   cetirizine 5 MG tablet Commonly known as: ZYRTEC Take 5 mg by mouth daily.   cyanocobalamin 1000 MCG tablet Commonly known as: VITAMIN B12 Take 1 tablet  (1,000 mcg total) by mouth daily.   EPINEPHrine 0.3 mg/0.3 mL Soaj injection Commonly known as: Auvi-Q Inject 0.3 mg into the muscle as needed for anaphylaxis.   fluticasone 50 MCG/ACT nasal spray Commonly known as: FLONASE 1 spray per nostril 1-2 times daily as needed.   lansoprazole 30 MG capsule Commonly known as: PREVACID Take 30 mg by mouth daily.   meloxicam 15 MG tablet Commonly known as: MOBIC Take 15 mg by mouth daily as needed for pain.   oxyCODONE-acetaminophen 5-325 MG tablet Commonly known as: Percocet Take 1 tablet by mouth every 8 (eight) hours as needed for severe pain.   Qvar RediHaler 80 MCG/ACT inhaler Generic drug: beclomethasone 1 puff 1-2 times daily depending on disease activity.   rosuvastatin 20 MG tablet Commonly known as: CRESTOR TAKE 1 TABLET BY MOUTH EVERY DAY   VITAMIN D3 PO Take 1 tablet by mouth daily.    Past Medical History:  Diagnosis Date   Allergic rhinitis    Anal fissure    Anxiety    Asthma    Atypical chest pain    CAD (coronary artery disease) 09/01/2019   a.) cCTA 09/01/2022: Ca2+ 50 (84th %'ile for age/sex/race match control); isolated to pLAD territory; b.) cCTA 02/27/2022: Ca2+ 67.1 (83rd (84th %'ile for age/sex/race match control); isolated to oLAD territory   Carpal tunnel syndrome    Complication of  anesthesia    a.) delayed emergence; "I dont need much anesthesia"   COPD (chronic obstructive pulmonary disease) (HCC)    Coronary atherosclerosis    Diverticulosis    GERD (gastroesophageal reflux disease)    H. pylori infection    Hearing loss, right    Hiatal hernia    Hyperlipidemia    LBP (low back pain)    TMJ (dislocation of temporomandibular joint)    Umbilical hernia 10/2022   Vitamin B 12 deficiency    Vitamin D deficiency     Past Surgical History:  Procedure Laterality Date   COLONOSCOPY     laser retinopexy Right 2015   STAPEDECTOMY Right    UMBILICAL HERNIA REPAIR N/A 11/06/2022   Procedure:  HERNIA REPAIR UMBILICAL ADULT  possibly w/ mesh;  Surgeon: Sung Amabile, DO;  Location: [facility] ORS;  Service: General;  Laterality: N/A;   UPPER GI ENDOSCOPY     VASECTOMY      Review of systems negative except as noted in HPI / PMHx or noted below:  Review of Systems  Constitutional: Negative.   HENT: Negative.    Eyes: Negative.   Respiratory: Negative.    Cardiovascular: Negative.   Gastrointestinal: Negative.   Genitourinary: Negative.   Musculoskeletal: Negative.   Skin: Negative.   Neurological: Negative.   Endo/Heme/Allergies: Negative.   Psychiatric/Behavioral: Negative.       Objective:   Vitals:   03/23/23 1214  BP: 120/76  Pulse: 98  Resp: 18  Temp: 98 F (36.7 C)  SpO2: 100%   Height: 5\' 6"  (167.6 cm)  Weight: 161 lb 11.2 oz (73.3 kg)   Physical Exam Constitutional:      Appearance: He is not diaphoretic.  HENT:     Head: Normocephalic.     Right Ear: Tympanic membrane, ear canal and external ear normal.     Left Ear: Tympanic membrane, ear canal and external ear normal.     Nose: Nose normal. No mucosal edema or rhinorrhea.     Mouth/Throat:     Pharynx: Uvula midline. No oropharyngeal exudate.  Eyes:     Conjunctiva/sclera: Conjunctivae normal.  Neck:     Thyroid: No thyromegaly.     Trachea: Trachea normal. No tracheal tenderness or tracheal deviation.  Cardiovascular:     Rate and Rhythm: Normal rate and regular rhythm.     Heart sounds: Normal heart sounds, S1 normal and S2 normal. No murmur heard. Pulmonary:     Effort: No respiratory distress.     Breath sounds: Normal breath sounds. No stridor. No wheezing or rales.  Lymphadenopathy:     Head:     Right side of head: No tonsillar adenopathy.     Left side of head: No tonsillar adenopathy.     Cervical: No cervical adenopathy.  Skin:    Findings: No erythema or rash.     Nails: There is no clubbing.  Neurological:     Mental Status: He is alert.     Diagnostics: Spirometry was  performed and demonstrated an FEV1 of 3.0 at 89 % of predicted.  Assessment and Plan:   1. Asthma, moderate persistent, well-controlled   2. Perennial allergic rhinitis   3. Seasonal allergic rhinitis due to pollen   4. LPRD (laryngopharyngeal reflux disease)   5. Anaphylactic shock due to food, subsequent encounter    1.  Continue to perform allergen avoidance measures - beef, dust mite, pollen, cat, dog, mold  2.  Continue to treat and  prevent inflammation:   A.  Flonase - 1 spray each nostril 1-2 times per day depending on disease activity  B.  Qvar 80 -2 inhalations 1-2 times per day depending on disease activity  3.  Continue to treat and prevent reflux:   A.  Prevacid 30 mg -1 tablet 1-2 times a day  B.  Continue to minimize caffeine and chocolate consumption  4. If needed:   A.  Pro-air HFA -2 inhalations every 4-6 hours  B.  Xyzal 5 mg -1 tablet daily  C.  Auvi-Q 0.3 / Epi-Pen, Benadryl, MD/ER evaluation for allergic reaction  5.  "Action Plan" for flare up:   A.  Increase Qvar to 3 inhalations 3 times per day  B.  Albuterol HFA if needed.   6.  Return to clinic in 12 months or earlier if problem  7. Influenza = Tamiflu. Covid = Paxlovid  Mercer is really doing very well at this point in time and he has had very good control of his airway disease and his reflux disease while utilizing a plan of anti-inflammatory agents for his airway and therapy tract against reflux for a prolonged period in time.  We are not really going to change much of this plan at this point.  He has a very good understanding of his disease state and how his medications work and appropriate dosing of his medications depending on disease activity.  He is going to remain away from beef consumption at this point.  I will see him back in this clinic in 1 year or earlier if there is a problem.  Laurette Schimke, MD Allergy / Immunology [facility]

## 2023-03-24 ENCOUNTER — Encounter: Payer: Self-pay | Admitting: Allergy and Immunology

## 2023-04-22 ENCOUNTER — Encounter: Payer: Self-pay | Admitting: Internal Medicine

## 2023-04-22 DIAGNOSIS — E538 Deficiency of other specified B group vitamins: Secondary | ICD-10-CM

## 2023-04-22 DIAGNOSIS — R739 Hyperglycemia, unspecified: Secondary | ICD-10-CM

## 2023-04-22 DIAGNOSIS — E7800 Pure hypercholesterolemia, unspecified: Secondary | ICD-10-CM

## 2023-04-22 DIAGNOSIS — E559 Vitamin D deficiency, unspecified: Secondary | ICD-10-CM

## 2023-04-22 DIAGNOSIS — R5383 Other fatigue: Secondary | ICD-10-CM

## 2023-04-22 DIAGNOSIS — R069 Unspecified abnormalities of breathing: Secondary | ICD-10-CM

## 2023-04-22 DIAGNOSIS — Z125 Encounter for screening for malignant neoplasm of prostate: Secondary | ICD-10-CM

## 2023-05-04 ENCOUNTER — Ambulatory Visit: Admitting: Sleep Medicine

## 2023-05-04 ENCOUNTER — Encounter: Payer: Self-pay | Admitting: Sleep Medicine

## 2023-05-04 VITALS — BP 118/82 | HR 70 | Temp 97.1°F | Ht 66.0 in | Wt 166.8 lb

## 2023-05-04 DIAGNOSIS — R0683 Snoring: Secondary | ICD-10-CM

## 2023-05-04 DIAGNOSIS — K2100 Gastro-esophageal reflux disease with esophagitis, without bleeding: Secondary | ICD-10-CM | POA: Diagnosis not present

## 2023-05-04 DIAGNOSIS — G4733 Obstructive sleep apnea (adult) (pediatric): Secondary | ICD-10-CM

## 2023-05-04 DIAGNOSIS — E785 Hyperlipidemia, unspecified: Secondary | ICD-10-CM

## 2023-05-04 NOTE — Patient Instructions (Signed)
 Marland Kitchen

## 2023-05-04 NOTE — Progress Notes (Signed)
 Name:Billy Morris MRN: [id number] DOB: 1970-06-29   CHIEF COMPLAINT:  EXCESSIVE DAYTIME SLEEPINESS   HISTORY OF PRESENT ILLNESS:  Billy Morris is a 53 y.o. w/ a h/o asthma, hyperlipidemia and GERD who presents for c/o loud snoring and excessive daytime sleepiness which has been present for several years. Reports nocturnal awakenings due to nocturia, however does not have difficulty falling back to sleep. Reports a 50 lb weight loss over the last few years. Denies morning headaches, RLS symptoms, dream enactment, cataplexy, hypnagogic or hypnapompic hallucinations. Denies a family history of sleep apnea. Denies drowsy driving. Drinks 12-20 ounces of half caf coffee daily, denies alcohol, tobacco or illicit drug use.   Bedtime 8-9 pm Sleep onset 10 mins Rise time 2:30-3 am   EPWORTH SLEEP SCORE 12     No data to display          PAST MEDICAL HISTORY :   has a past medical history of Allergic rhinitis, Anal fissure, Anxiety, Asthma, Atypical chest pain, CAD (coronary artery disease) (09/01/2019), Carpal tunnel syndrome, Complication of anesthesia, COPD (chronic obstructive pulmonary disease) (HCC), Coronary atherosclerosis, Diverticulosis, GERD (gastroesophageal reflux disease), H. pylori infection, Hearing loss, right, Hiatal hernia, Hyperlipidemia, LBP (low back pain), TMJ (dislocation of temporomandibular joint), Umbilical hernia (10/2022), Vitamin B 12 deficiency, and Vitamin D deficiency.  has a past surgical history that includes Vasectomy; Stapedectomy (Right); laser retinopexy (Right, 2015); Colonoscopy; Upper gi endoscopy; and Umbilical hernia repair (N/A, 11/06/2022). Prior to Admission medications   Medication Sig Start Date End Date Taking? Authorizing Provider  albuterol (PROAIR HFA) 108 (90 Base) MCG/ACT inhaler Inhale 2 puffs into the lungs every 4 (four) hours as needed for wheezing or shortness of breath. 03/23/23   Kozlow, Alvira Philips, MD  aspirin EC 81 MG tablet Take 1  tablet (81 mg total) by mouth daily. Swallow whole. 02/23/22   Nahser, Deloris Ping, MD  cetirizine (ZYRTEC) 5 MG tablet Take 5 mg by mouth daily.    [provider]  Cholecalciferol (VITAMIN D3 PO) Take 1 tablet by mouth daily.    [provider]  EPINEPHrine (AUVI-Q) 0.3 mg/0.3 mL IJ SOAJ injection Inject 0.3 mg into the muscle as needed for anaphylaxis. 03/10/22   Kozlow, Alvira Philips, MD  EPINEPHrine 0.3 mg/0.3 mL IJ SOAJ injection Inject 0.3 mg into the muscle as needed for anaphylaxis. 03/23/23   Kozlow, Alvira Philips, MD  fluticasone (FLONASE) 50 MCG/ACT nasal spray 1 spray per nostril 1-2 times daily as needed. 03/10/22   Kozlow, Alvira Philips, MD  lansoprazole (PREVACID) 30 MG capsule Take 30 mg by mouth daily.    [provider]  meloxicam (MOBIC) 15 MG tablet Take 15 mg by mouth daily as needed for pain.    [provider]  oxyCODONE-acetaminophen (PERCOCET) 5-325 MG tablet Take 1 tablet by mouth every 8 (eight) hours as needed for severe pain. 11/06/22 11/06/23  Tonna Boehringer, Isami, DO  QVAR REDIHALER 80 MCG/ACT inhaler 1 puff 1-2 times daily depending on disease activity. 03/23/23   Kozlow, Alvira Philips, MD  rosuvastatin (CRESTOR) 20 MG tablet TAKE 1 TABLET BY MOUTH EVERY DAY 02/25/23   Nahser, Deloris Ping, MD  vitamin B-12 (CYANOCOBALAMIN) 1000 MCG tablet Take 1 tablet (1,000 mcg total) by mouth daily. 05/06/19   Corwin Levins, MD   Allergies  Allergen Reactions   Beef (Bovine) Protein Hives    All red meat causes hives   Ciprofloxacin Other (See Comments) and Diarrhea  Bloody stool Bloody stool Stools have bloody mucous   Penicillins Rash   Sulfa Antibiotics Rash   Tiotropium Bromide Monohydrate Other (See Comments)    heart racing and paresthesias   Nabumetone     weakness   Sulfonamide Derivatives     FAMILY HISTORY:  family history includes Allergies in an other family member; Asthma in his son; Atrial fibrillation in his father; COPD in his maternal grandfather; Colon polyps in  his maternal grandfather; Diabetes in his maternal aunt, maternal grandmother, and paternal aunt; Heart attack in his maternal grandmother; Heart failure in an other family member; Hyperlipidemia in his father and mother; Irritable bowel syndrome in his maternal aunt; Other in his mother; Ovarian cancer in his paternal grandmother; Prostate cancer in his maternal grandfather; Stroke in his maternal aunt. SOCIAL HISTORY:  reports that he has never smoked. He has never used smokeless tobacco. He reports that he does not drink alcohol and does not use drugs.   Review of Systems:  Gen:  Denies  fever, sweats, chills weight loss  HEENT: Denies blurred vision, double vision, ear pain, eye pain, hearing loss, nose bleeds, sore throat Cardiac:  No dizziness, chest pain or heaviness, chest tightness,edema, No JVD Resp:   No cough, -sputum production, -shortness of breath,-wheezing, -hemoptysis,  Gi: Denies swallowing difficulty, stomach pain, nausea or vomiting, diarrhea, constipation, bowel incontinence Gu:  Denies bladder incontinence, burning urine Ext:   Denies Joint pain, stiffness or swelling Skin: Denies  skin rash, easy bruising or bleeding or hives Endoc:  Denies polyuria, polydipsia , polyphagia or weight change Psych:   Denies depression, insomnia or hallucinations  Other:  All other systems negative  VITAL SIGNS: BP 118/82 (BP Location: Right Arm, Cuff Size: Normal)   Pulse 70   Temp (!) 97.1 F (36.2 C)   Ht 5\' 6"  (1.676 m)   Wt 166 lb 12.8 oz (75.7 kg)   SpO2 98%   BMI 26.92 kg/m     Physical Examination:   General Appearance: No distress  EYES PERRLA, EOM intact.   NECK Supple, No JVD Mallampati IV Pulmonary: normal breath sounds, No wheezing.  CardiovascularNormal S1,S2.  No m/r/g.   Abdomen: Benign, Soft, non-tender. Skin:   warm, no rashes, no ecchymosis  Extremities: normal, no cyanosis, clubbing. Neuro:without focal findings,  speech normal  PSYCHIATRIC: Mood,  affect within normal limits.   ASSESSMENT AND PLAN  OSA I suspect that OSA is likely present due to clinical presentation. Discussed the consequences of untreated sleep apnea. Advised not to drive drowsy for safety of patient and others. Will complete further evaluation with a home sleep study and follow up to review results.    GERD Stable, on current management. Following with PCP.   Hyperlipidemia Stable, on current management.   MEDICATION ADJUSTMENTS/LABS AND TESTS ORDERED: Recommend Sleep Study   Patient  satisfied with Plan of action and management. All questions answered  Follow up to review HST results and treatment plan.   I spent a total of 49 minutes reviewing chart data, face-to-face evaluation with the patient, counseling and coordination of care as detailed above.    Tempie Hoist, M.D.  Sleep Medicine [facility]

## 2023-05-21 ENCOUNTER — Encounter

## 2023-05-21 DIAGNOSIS — G4733 Obstructive sleep apnea (adult) (pediatric): Secondary | ICD-10-CM

## 2023-05-23 ENCOUNTER — Other Ambulatory Visit: Payer: Self-pay | Admitting: Cardiovascular Disease

## 2023-05-24 ENCOUNTER — Encounter: Payer: Self-pay | Admitting: Cardiovascular Disease

## 2023-05-24 DIAGNOSIS — E7800 Pure hypercholesterolemia, unspecified: Secondary | ICD-10-CM

## 2023-05-25 ENCOUNTER — Encounter: Payer: Self-pay | Admitting: Cardiovascular Disease

## 2023-05-25 NOTE — Progress Notes (Signed)
 Cardiology Office Note:    Date:  06/02/2023   ID:  Billy Morris, DOB [birth year], MRN [id number]  PCP:  Roslyn Coombe, MD   [facility] Providers Cardiologist:  Takiesha Mcdevitt     Referring MD: Roslyn Coombe, MD   Chief Complaint  Patient presents with   Hyperlipidemia     History of Present Illness:    Seen with Cain Castillo ( wife )  Billy Morris is a 53 y.o. male with a hx of anxiety, COPD, HLD  Had some atypical chest pain while moving a heavy grill.  The pain lasted only for a second.  Coronary calcium  score was found to be 50-isolated almost entirely to the proximal LAD.  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   Has lots of anxiety that causes left sided back pain , left jaw pain    Has been improving his diet ,  avoiding sugar, More fruits, veggies,   Lipids look ok   Non smoker,  does have some asthma   Will get LP(a) today   Will get a coronary CTA for his instrapular pain and left jaw pain     Fam hx Cousin age 26 had MI Another male cousin, MI age 60  3 aunts who had strokes, 2 of them had MI's Father has afib   Started Atorvastatin  1-2 years ago .   May 22, 2022: Billy Morris is  seen today for follow-up of his chest discomfort.  He has a history of hyperlipidemia.  He has a strong family history of coronary artery disease.  Coronary CT angiogram from February 27, 2022 reveals a coronary calcium  score of 67.1.  This places him in the 83rd percentile for age and sex matched controls.  His mixed nonobstructive coronary artery disease.  His LDL goal is between 50 and 70. LP(a) is less than 8.4.   LDL was 51  Trigs = 69   Wt is 173 lbs   Listens to " the exam room " pod cast   June 02, 2023  Billy Morris is seen for  follow up of his coronary artery calcifications Was recently found to have OSA , will be seeing pulmonary soon to get CPAP .   Is on a strict diet Had hernia surgery last fall,  difficult to get back on his exercise routne .  Under lots of stress  Daughter is in medical school ( [place], [place] )  - she recently wrecked her car   Billy Morris is in poor health      Past Medical History:  Diagnosis Date   Allergic rhinitis    Anal fissure    Anxiety    Asthma    Atypical chest pain    CAD (coronary artery disease) 09/01/2019   a.) cCTA 09/01/2022: Ca2+ 50 (84th %'ile for age/sex/race match control); isolated to pLAD territory; b.) cCTA 02/27/2022: Ca2+ 67.1 (83rd (84th %'ile for age/sex/race match control); isolated to oLAD territory   Carpal tunnel syndrome    Complication of anesthesia    a.) delayed emergence; "I dont need much anesthesia"   COPD (chronic obstructive pulmonary disease) (HCC)    Coronary atherosclerosis    Diverticulosis  GERD (gastroesophageal reflux disease)    H. pylori infection    Hearing loss, right    Hiatal hernia    Hyperlipidemia    LBP (low back pain)    TMJ (dislocation of temporomandibular joint)    Umbilical hernia 10/2022   Vitamin B 12 deficiency    Vitamin D  deficiency     Past Surgical History:  Procedure Laterality Date   COLONOSCOPY     laser retinopexy Right 2015   STAPEDECTOMY Right    UMBILICAL HERNIA REPAIR N/A 11/06/2022   Procedure: HERNIA REPAIR UMBILICAL ADULT  possibly w/ mesh;  Surgeon: Conrado Delay, DO;  Location: [facility] ORS;  Service: General;  Laterality: N/A;   UPPER GI ENDOSCOPY     VASECTOMY      Current Medications: Current Meds  Medication Sig   albuterol  (PROAIR  HFA) 108 (90 Base) MCG/ACT inhaler Inhale 2 puffs into the lungs every 4 (four) hours as needed for wheezing or shortness of breath.   aspirin  EC 81 MG tablet Take 1 tablet (81 mg total) by mouth daily. Swallow whole.   cetirizine (ZYRTEC) 5 MG tablet Take  5 mg by mouth daily.   Cholecalciferol (VITAMIN D3 PO) Take 1 tablet by mouth daily.   EPINEPHrine  (AUVI-Q ) 0.3 mg/0.3 mL IJ SOAJ injection Inject 0.3 mg into the muscle as needed for anaphylaxis.   EPINEPHrine  0.3 mg/0.3 mL IJ SOAJ injection Inject 0.3 mg into the muscle as needed for anaphylaxis.   fluticasone  (FLONASE ) 50 MCG/ACT nasal spray 1 spray per nostril 1-2 times daily as needed.   lansoprazole  (PREVACID ) 30 MG capsule Take 30 mg by mouth daily.   meloxicam  (MOBIC ) 15 MG tablet Take 15 mg by mouth daily as needed for pain.   oxyCODONE -acetaminophen  (PERCOCET) 5-325 MG tablet Take 1 tablet by mouth every 8 (eight) hours as needed for severe pain.   QVAR  REDIHALER 80 MCG/ACT inhaler 1 puff 1-2 times daily depending on disease activity.   rosuvastatin  (CRESTOR ) 20 MG tablet TAKE 1 TABLET BY MOUTH EVERY DAY   vitamin B-12 (CYANOCOBALAMIN ) 1000 MCG tablet Take 1 tablet (1,000 mcg total) by mouth daily.     Allergies:   Beef (bovine) protein, Ciprofloxacin, Penicillins, Sulfa antibiotics, Tiotropium bromide monohydrate, Corticosteroids, Nabumetone, and Sulfonamide derivatives   Social History   Socioeconomic History   Marital status: Married    Spouse name: Cain Castillo   Number of children: 2   Years of education: Not on file   Highest education level: Not on file  Occupational History   Occupation: Regulatory affairs officer  Tobacco Use   Smoking status: Never   Smokeless tobacco: Never  Vaping Use   Vaping status: Never Used  Substance and Sexual Activity   Alcohol use: No   Drug use: No   Sexual activity: Not on file  Other Topics Concern   Not on file  Social History Narrative   Not on file   Social Drivers of Health   Financial Resource Strain: Not on file  Food Insecurity: Not on file  Transportation Needs: Not on file  Physical Activity: Not on file  Stress: Not on file  Social Connections: Not on file     Family History: The patient's family history includes  Allergies in an other family member; Asthma in his son; Atrial fibrillation in his father; COPD in his maternal grandfather; Colon polyps in his maternal grandfather; Diabetes in his maternal aunt, maternal grandmother, and paternal aunt; Heart attack in his maternal grandmother; Heart failure in  an other family member; Hyperlipidemia in his father and mother; Irritable bowel syndrome in his maternal aunt; Other in his mother; Ovarian cancer in his paternal grandmother; Prostate cancer in his maternal grandfather; Stroke in his maternal aunt. There is no history of Allergic rhinitis, Angioedema, Atopy, Eczema, Immunodeficiency, or Urticaria.  ROS:   Please see the history of present illness.     All other systems reviewed and are negative.  EKGs/Labs/Other Studies Reviewed:    The following studies were reviewed today:   EKG:          Recent Labs: 12/16/2022: Hemoglobin 13.5; Platelets 222.0; TSH 1.66 05/28/2023: ALT 19; BUN 16; Creatinine, Ser 0.92; Potassium 4.1; Sodium 142  Recent Lipid Panel    Component Value Date/Time   CHOL 123 05/28/2023 0831   TRIG 48 05/28/2023 0831   HDL 59 05/28/2023 0831   CHOLHDL 2.1 05/28/2023 0831   CHOLHDL 2 12/16/2022 0815   VLDL 10.4 12/16/2022 0815   LDLCALC 53 05/28/2023 0831     Risk Assessment/Calculations:       Physical Exam:     Physical Exam: Blood pressure 110/74, pulse (!) 59, height 5\' 6"  (1.676 m), weight 162 lb (73.5 kg), SpO2 97%.       GEN:  Well nourished, well developed in no acute distress HEENT: Normal NECK: No JVD; No carotid bruits LYMPHATICS: No lymphadenopathy CARDIAC: RRR , no murmurs, rubs, gallops RESPIRATORY:  Clear to auscultation without rales, wheezing or rhonchi  ABDOMEN: Soft, non-tender, non-distended MUSCULOSKELETAL:  No edema; No deformity  SKIN: Warm and dry NEUROLOGIC:  Alert and oriented x 3     ASSESSMENT:    1. Pure hypercholesterolemia      PLAN:       Coronary artery disease:  [place] has mild to moderate coronary artery disease.  He has nonobstructive disease by coronary CTA.  He is not having any significant angina pain.  2.  Hyperlipidemia: His lipids look great.  Continue current medications.  He is not exercising quite as much because of hernia surgery last fall but is eager to get back into his regular exercise program.            Medication Adjustments/Labs and Tests Ordered: Current medicines are reviewed at length with the patient today.  Concerns regarding medicines are outlined above.  No orders of the defined types were placed in this encounter.  No orders of the defined types were placed in this encounter.   Patient Instructions  Follow-Up: At [facility], you and your health needs are our priority.  As part of our continuing mission to provide you with exceptional heart care, our providers are all part of one team.  This team includes your primary Cardiologist (physician) and Advanced Practice Providers or APPs (Physician Assistants and Nurse Practitioners) who all work together to provide you with the care you need, when you need it.  Your next appointment:   1 year(s)  Provider:   Ahmad Alert, MD        1st Floor: - Lobby - Registration  - Pharmacy  - Lab - Cafe  2nd Floor: - PV Lab - Diagnostic Testing (echo, CT, nuclear med)  3rd Floor: - Vacant  4th Floor: - TCTS (cardiothoracic surgery) - AFib Clinic - Structural Heart Clinic - Vascular Surgery  - Vascular Ultrasound  5th Floor: - HeartCare Cardiology (general and EP) - Clinical Pharmacy for coumadin, hypertension, lipid, weight-loss medications, and med management appointments    Valet parking services  will be available as well.     Signed, Ahmad Alert, MD  06/02/2023 2:30 PM    [facility]

## 2023-05-28 ENCOUNTER — Ambulatory Visit: Payer: Managed Care, Other (non HMO) | Admitting: Cardiovascular Disease

## 2023-05-28 LAB — BASIC METABOLIC PANEL WITH GFR
BUN/Creatinine Ratio: 17 (ref 9–20)
BUN: 16 mg/dL (ref 6–24)
CO2: 24 mmol/L (ref 20–29)
Calcium: 9.1 mg/dL (ref 8.7–10.2)
Chloride: 103 mmol/L (ref 96–106)
Creatinine, Ser: 0.92 mg/dL (ref 0.76–1.27)
Glucose: 89 mg/dL (ref 70–99)
Potassium: 4.1 mmol/L (ref 3.5–5.2)
Sodium: 142 mmol/L (ref 134–144)
eGFR: 100 mL/min/{1.73_m2} (ref >59)

## 2023-05-28 LAB — LIPID PANEL
Chol/HDL Ratio: 2.1 ratio (ref 0.0–5.0)
Cholesterol, Total: 123 mg/dL (ref 100–199)
HDL: 59 mg/dL (ref >39)
LDL Chol Calc (NIH): 53 mg/dL (ref 0–99)
Triglycerides: 48 mg/dL (ref 0–149)
VLDL Cholesterol Cal: 11 mg/dL (ref 5–40)

## 2023-05-28 LAB — ALT: ALT: 19 IU/L (ref 0–44)

## 2023-05-31 DIAGNOSIS — R069 Unspecified abnormalities of breathing: Secondary | ICD-10-CM | POA: Diagnosis not present

## 2023-06-02 ENCOUNTER — Encounter: Payer: Self-pay | Admitting: Cardiovascular Disease

## 2023-06-02 ENCOUNTER — Ambulatory Visit: Attending: Cardiovascular Disease | Admitting: Cardiovascular Disease

## 2023-06-02 VITALS — BP 110/74 | HR 59 | Ht 66.0 in | Wt 162.0 lb

## 2023-06-02 DIAGNOSIS — E7800 Pure hypercholesterolemia, unspecified: Secondary | ICD-10-CM

## 2023-06-02 NOTE — Patient Instructions (Signed)
 Follow-Up: At [facility], you and your health needs are our priority.  As part of our continuing mission to provide you with exceptional heart care, our providers are all part of one team.  This team includes your primary Cardiologist (physician) and Advanced Practice Providers or APPs (Physician Assistants and Nurse Practitioners) who all work together to provide you with the care you need, when you need it.  Your next appointment:   1 year(s)  Provider:   Kristeen Miss, MD     1st Floor: - Lobby - Registration  - Pharmacy  - Lab - Cafe  2nd Floor: - PV Lab - Diagnostic Testing (echo, CT, nuclear med)  3rd Floor: - Vacant  4th Floor: - TCTS (cardiothoracic surgery) - AFib Clinic - Structural Heart Clinic - Vascular Surgery  - Vascular Ultrasound  5th Floor: - HeartCare Cardiology (general and EP) - Clinical Pharmacy for coumadin, hypertension, lipid, weight-loss medications, and med management appointments    Valet parking services will be available as well.

## 2023-06-09 ENCOUNTER — Ambulatory Visit (INDEPENDENT_AMBULATORY_CARE_PROVIDER_SITE_OTHER): Admitting: Sleep Medicine

## 2023-06-09 ENCOUNTER — Encounter: Payer: Self-pay | Admitting: Sleep Medicine

## 2023-06-09 VITALS — BP 118/60 | HR 69 | Temp 97.6°F | Ht 66.0 in | Wt 164.6 lb

## 2023-06-09 DIAGNOSIS — G4733 Obstructive sleep apnea (adult) (pediatric): Secondary | ICD-10-CM | POA: Diagnosis not present

## 2023-06-09 DIAGNOSIS — K219 Gastro-esophageal reflux disease without esophagitis: Secondary | ICD-10-CM | POA: Diagnosis not present

## 2023-06-09 DIAGNOSIS — E785 Hyperlipidemia, unspecified: Secondary | ICD-10-CM | POA: Diagnosis not present

## 2023-06-09 DIAGNOSIS — K2100 Gastro-esophageal reflux disease with esophagitis, without bleeding: Secondary | ICD-10-CM

## 2023-06-09 NOTE — Progress Notes (Signed)
 Name:Billy Morris MRN: [id number] DOB: March 27, 1970   CHIEF COMPLAINT:  HST F/U   HISTORY OF PRESENT ILLNESS:  Mr. Billy Morris is a 53 y.o. w/ a h/o GERD, asthma and hyperlipidemia who presents to follow up on HST results. The patient underwent HST which revealed moderate OSA (AHI 26, O2 nadir 84%).    EPWORTH SLEEP SCORE    05/04/2023    3:00 PM  Results of the Epworth flowsheet  Sitting and reading 3  Watching TV 2  Sitting, inactive in a public place (e.g. a theatre or a meeting) 0  As a passenger in a car for an hour without a break 3  Lying down to rest in the afternoon when circumstances permit 3  Sitting and talking to someone 0  Sitting quietly after a lunch without alcohol 1  In a car, while stopped for a few minutes in traffic 0  Total score 12     PAST MEDICAL HISTORY :   has a past medical history of Allergic rhinitis, Anal fissure, Anxiety, Asthma, Atypical chest pain, CAD (coronary artery disease) (09/01/2019), Carpal tunnel syndrome, Complication of anesthesia, COPD (chronic obstructive pulmonary disease) (HCC), Coronary atherosclerosis, Diverticulosis, GERD (gastroesophageal reflux disease), H. pylori infection, Hearing loss, right, Hiatal hernia, Hyperlipidemia, LBP (low back pain), TMJ (dislocation of temporomandibular joint), Umbilical hernia (10/2022), Vitamin B 12 deficiency, and Vitamin D  deficiency.  has a past surgical history that includes Vasectomy; Stapedectomy (Right); laser retinopexy (Right, 2015); Colonoscopy; Upper gi endoscopy; and Umbilical hernia repair (N/A, 11/06/2022). Prior to Admission medications   Medication Sig Start Date End Date Taking? Authorizing Provider  albuterol  (PROAIR  HFA) 108 (90 Base) MCG/ACT inhaler Inhale 2 puffs into the lungs every 4 (four) hours as needed for wheezing or shortness of breath. 03/23/23   Kozlow, Rema Care, MD  aspirin  EC 81 MG tablet Take 1 tablet (81 mg total) by mouth daily. Swallow whole. 02/23/22   Nahser,  Lela Purple, MD  cetirizine (ZYRTEC) 5 MG tablet Take 5 mg by mouth daily.    [provider]  Cholecalciferol (VITAMIN D3 PO) Take 1 tablet by mouth daily.    [provider]  EPINEPHrine  (AUVI-Q ) 0.3 mg/0.3 mL IJ SOAJ injection Inject 0.3 mg into the muscle as needed for anaphylaxis. 03/10/22   Kozlow, Rema Care, MD  EPINEPHrine  0.3 mg/0.3 mL IJ SOAJ injection Inject 0.3 mg into the muscle as needed for anaphylaxis. 03/23/23   Kozlow, Rema Care, MD  fluticasone  (FLONASE ) 50 MCG/ACT nasal spray 1 spray per nostril 1-2 times daily as needed. 03/10/22   Kozlow, Rema Care, MD  lansoprazole  (PREVACID ) 30 MG capsule Take 30 mg by mouth daily.    [provider]  meloxicam  (MOBIC ) 15 MG tablet Take 15 mg by mouth daily as needed for pain.    [provider]  oxyCODONE -acetaminophen  (PERCOCET) 5-325 MG tablet Take 1 tablet by mouth every 8 (eight) hours as needed for severe pain. 11/06/22 11/06/23  Sakai, Isami, DO  QVAR  REDIHALER 80 MCG/ACT inhaler 1 puff 1-2 times daily depending on disease activity. 03/23/23   Kozlow, Rema Care, MD  rosuvastatin  (CRESTOR ) 20 MG tablet TAKE 1 TABLET BY MOUTH EVERY DAY 05/25/23   Nahser, Lela Purple, MD  vitamin B-12 (CYANOCOBALAMIN ) 1000 MCG tablet Take 1 tablet (1,000 mcg total) by mouth daily. 05/06/19   Roslyn Coombe, MD   Allergies  Allergen Reactions   Beef (Bovine) Protein Hives    All red meat causes hives  Ciprofloxacin Other (See Comments) and Diarrhea    Bloody stool Bloody stool Stools have bloody mucous   Penicillins Rash   Sulfa Antibiotics Rash   Tiotropium Bromide Monohydrate Other (See Comments)    heart racing and paresthesias   Corticosteroids     CSP- Central serous retinopathy    Nabumetone     weakness   Sulfonamide Derivatives     FAMILY HISTORY:  family history includes Allergies in an other family member; Asthma in his son; Atrial fibrillation in his father; COPD in his maternal grandfather; Colon polyps in his maternal  grandfather; Diabetes in his maternal aunt, maternal grandmother, and paternal aunt; Heart attack in his maternal grandmother; Heart failure in an other family member; Hyperlipidemia in his father and mother; Irritable bowel syndrome in his maternal aunt; Other in his mother; Ovarian cancer in his paternal grandmother; Prostate cancer in his maternal grandfather; Stroke in his maternal aunt. SOCIAL HISTORY:  reports that he has never smoked. He has never used smokeless tobacco. He reports that he does not drink alcohol and does not use drugs.   Review of Systems:  Gen:  Denies  fever, sweats, chills weight loss  HEENT: Denies blurred vision, double vision, ear pain, eye pain, hearing loss, nose bleeds, sore throat Cardiac:  No dizziness, chest pain or heaviness, chest tightness,edema, No JVD Resp:   No cough, -sputum production, -shortness of breath,-wheezing, -hemoptysis,  Gi: Denies swallowing difficulty, stomach pain, nausea or vomiting, diarrhea, constipation, bowel incontinence Gu:  Denies bladder incontinence, burning urine Ext:   Denies Joint pain, stiffness or swelling Skin: Denies  skin rash, easy bruising or bleeding or hives Endoc:  Denies polyuria, polydipsia , polyphagia or weight change Psych:   Denies depression, insomnia or hallucinations  Other:  All other systems negative  VITAL SIGNS: BP 118/60 (BP Location: Left Arm, Patient Position: Sitting, Cuff Size: Normal)   Pulse 69   Temp 97.6 F (36.4 C) (Temporal)   Ht 5\' 6"  (1.676 m)   Wt 164 lb 9.6 oz (74.7 kg)   SpO2 98%   BMI 26.57 kg/m     Physical Examination:   General Appearance: No distress  EYES PERRLA, EOM intact.   NECK Supple, No JVD Pulmonary: normal breath sounds, No wheezing.  CardiovascularNormal S1,S2.  No m/r/g.   Abdomen: Benign, Soft, non-tender. Skin:   warm, no rashes, no ecchymosis  Extremities: normal, no cyanosis, clubbing. Neuro:without focal findings,  speech normal  PSYCHIATRIC:  Mood, affect within normal limits.   ASSESSMENT AND PLAN  OSA Reviewed HST results with patient. Starting on APAP therapy set to 4-16 cm H2O. Discussed the consequences of untreated sleep apnea. Advised not to drive drowsy for safety of patient and others. Will follow up in 3 month to review CPAP efficacy and compliance data.    GERD Stable, on current management. Following with PCP.   Hyperlipidemia Stable, on current management.    Patient  satisfied with Plan of action and management. All questions answered  I spent a total of 32 minutes reviewing chart data, face-to-face evaluation with the patient, counseling and coordination of care as detailed above.    Nashea Chumney, M.D.  Sleep Medicine [facility]

## 2023-06-09 NOTE — Patient Instructions (Signed)

## 2023-06-25 ENCOUNTER — Encounter: Payer: Managed Care, Other (non HMO) | Admitting: Internal Medicine

## 2023-06-28 ENCOUNTER — Other Ambulatory Visit (INDEPENDENT_AMBULATORY_CARE_PROVIDER_SITE_OTHER)

## 2023-06-28 DIAGNOSIS — Z125 Encounter for screening for malignant neoplasm of prostate: Secondary | ICD-10-CM | POA: Diagnosis not present

## 2023-06-28 DIAGNOSIS — E538 Deficiency of other specified B group vitamins: Secondary | ICD-10-CM

## 2023-06-28 DIAGNOSIS — E559 Vitamin D deficiency, unspecified: Secondary | ICD-10-CM | POA: Diagnosis not present

## 2023-06-28 DIAGNOSIS — E7800 Pure hypercholesterolemia, unspecified: Secondary | ICD-10-CM

## 2023-06-28 DIAGNOSIS — R5383 Other fatigue: Secondary | ICD-10-CM | POA: Diagnosis not present

## 2023-06-28 DIAGNOSIS — R739 Hyperglycemia, unspecified: Secondary | ICD-10-CM | POA: Diagnosis not present

## 2023-06-28 LAB — LIPID PANEL
Cholesterol: 125 mg/dL (ref 0–200)
HDL: 51.7 mg/dL (ref >39.00)
LDL Cholesterol: 62 mg/dL (ref 0–99)
NonHDL: 72.92
Total CHOL/HDL Ratio: 2
Triglycerides: 54 mg/dL (ref 0.0–149.0)
VLDL: 10.8 mg/dL (ref 0.0–40.0)

## 2023-06-28 LAB — BASIC METABOLIC PANEL WITH GFR
BUN: 19 mg/dL (ref 6–23)
CO2: 28 meq/L (ref 19–32)
Calcium: 8.9 mg/dL (ref 8.4–10.5)
Chloride: 103 meq/L (ref 96–112)
Creatinine, Ser: 0.76 mg/dL (ref 0.40–1.50)
GFR: 103.04 mL/min (ref >60.00)
Glucose, Bld: 82 mg/dL (ref 70–99)
Potassium: 4.1 meq/L (ref 3.5–5.1)
Sodium: 139 meq/L (ref 135–145)

## 2023-06-28 LAB — HEPATIC FUNCTION PANEL
ALT: 18 U/L (ref 0–53)
AST: 18 U/L (ref 0–37)
Albumin: 4.3 g/dL (ref 3.5–5.2)
Alkaline Phosphatase: 50 U/L (ref 39–117)
Bilirubin, Direct: 0.2 mg/dL (ref 0.0–0.3)
Total Bilirubin: 1 mg/dL (ref 0.2–1.2)
Total Protein: 6.3 g/dL (ref 6.0–8.3)

## 2023-06-28 LAB — URINALYSIS, ROUTINE W REFLEX MICROSCOPIC
Bilirubin Urine: NEGATIVE
Hgb urine dipstick: NEGATIVE
Ketones, ur: NEGATIVE
Leukocytes,Ua: NEGATIVE
Nitrite: NEGATIVE
RBC / HPF: NONE SEEN (ref 0–?)
Specific Gravity, Urine: 1.025 (ref 1.000–1.030)
Total Protein, Urine: NEGATIVE
Urine Glucose: NEGATIVE
Urobilinogen, UA: 0.2 (ref 0.0–1.0)
pH: 6 (ref 5.0–8.0)

## 2023-06-28 LAB — CBC WITH DIFFERENTIAL/PLATELET
Basophils Absolute: 0 10*3/uL (ref 0.0–0.1)
Basophils Relative: 1 % (ref 0.0–3.0)
Eosinophils Absolute: 0.2 10*3/uL (ref 0.0–0.7)
Eosinophils Relative: 3.9 % (ref 0.0–5.0)
HCT: 40.1 % (ref 39.0–52.0)
Hemoglobin: 13.9 g/dL (ref 13.0–17.0)
Lymphocytes Relative: 33.3 % (ref 12.0–46.0)
Lymphs Abs: 1.7 10*3/uL (ref 0.7–4.0)
MCHC: 34.7 g/dL (ref 30.0–36.0)
MCV: 91.7 fl (ref 78.0–100.0)
Monocytes Absolute: 0.6 10*3/uL (ref 0.1–1.0)
Monocytes Relative: 11 % (ref 3.0–12.0)
Neutro Abs: 2.7 10*3/uL (ref 1.4–7.7)
Neutrophils Relative %: 50.8 % (ref 43.0–77.0)
Platelets: 196 10*3/uL (ref 150.0–400.0)
RBC: 4.38 Mil/uL (ref 4.22–5.81)
RDW: 12.2 % (ref 11.5–15.5)
WBC: 5.2 10*3/uL (ref 4.0–10.5)

## 2023-06-28 LAB — HEMOGLOBIN A1C: Hgb A1c MFr Bld: 5.4 % (ref 4.6–6.5)

## 2023-06-28 LAB — MICROALBUMIN / CREATININE URINE RATIO
Creatinine,U: 141.2 mg/dL
Microalb Creat Ratio: UNDETERMINED mg/g (ref 0.0–30.0)
Microalb, Ur: <0.7 mg/dL

## 2023-06-29 LAB — TSH: TSH: 1.64 u[IU]/mL (ref 0.35–5.50)

## 2023-06-29 LAB — VITAMIN B12: Vitamin B-12: 424 pg/mL (ref 211–911)

## 2023-06-29 LAB — TESTOSTERONE: Testosterone: 668.61 ng/dL (ref 300.00–890.00)

## 2023-06-29 LAB — VITAMIN D 25 HYDROXY (VIT D DEFICIENCY, FRACTURES): VITD: 27.91 ng/mL — ABNORMAL LOW (ref 30.00–100.00)

## 2023-06-29 LAB — PSA: PSA: 0.33 ng/mL (ref 0.10–4.00)

## 2023-06-30 ENCOUNTER — Encounter: Payer: Self-pay | Admitting: Internal Medicine

## 2023-06-30 ENCOUNTER — Ambulatory Visit (INDEPENDENT_AMBULATORY_CARE_PROVIDER_SITE_OTHER): Admitting: Internal Medicine

## 2023-06-30 VITALS — BP 126/82 | HR 53 | Temp 98.1°F | Ht 66.0 in | Wt 165.0 lb

## 2023-06-30 DIAGNOSIS — Z0000 Encounter for general adult medical examination without abnormal findings: Secondary | ICD-10-CM

## 2023-06-30 DIAGNOSIS — Z125 Encounter for screening for malignant neoplasm of prostate: Secondary | ICD-10-CM | POA: Diagnosis not present

## 2023-06-30 DIAGNOSIS — R739 Hyperglycemia, unspecified: Secondary | ICD-10-CM

## 2023-06-30 DIAGNOSIS — E7800 Pure hypercholesterolemia, unspecified: Secondary | ICD-10-CM

## 2023-06-30 DIAGNOSIS — E559 Vitamin D deficiency, unspecified: Secondary | ICD-10-CM

## 2023-06-30 DIAGNOSIS — L989 Disorder of the skin and subcutaneous tissue, unspecified: Secondary | ICD-10-CM | POA: Insufficient documentation

## 2023-06-30 DIAGNOSIS — E538 Deficiency of other specified B group vitamins: Secondary | ICD-10-CM

## 2023-06-30 DIAGNOSIS — Z0001 Encounter for general adult medical examination with abnormal findings: Secondary | ICD-10-CM

## 2023-06-30 MED ORDER — KETOCONAZOLE 2 % EX CREA: 1.0000 | TOPICAL_CREAM | Freq: Every day | CUTANEOUS | 0 refills | Status: AC

## 2023-06-30 NOTE — Patient Instructions (Addendum)
 Please take all new medication as prescribed  - the antifungal cream  If no improved in 1 wk, please see your Dermatologist  Ok to increase the Vit D to 2000 units per day  Please continue all other medications as before, and refills have been done if requested.  Please have the pharmacy call with any other refills you may need.  Please continue your efforts at being more active, low cholesterol diet, and weight control.  You are otherwise up to date with prevention measures today.  Please keep your appointments with your specialists as you may have planned - cardiology   Your labs work was very good!  Please make an Appointment to return for your 1 year visit, or sooner if needed, with Lab testing by Appointment as well, to be done about 3-5 days before at the FIRST FLOOR Lab (so this is for TWO appointments - please see the scheduling desk as you leave)

## 2023-06-30 NOTE — Progress Notes (Signed)
 Patient ID: Billy Morris, male   DOB: 02/03/1971, 53 y.o.   MRN: [id number]         Chief Complaint:: wellness exam and hld, low b12 and D, hyperglycemia, right chest wall rash       HPI:  Billy Morris is a 53 y.o. male here for wellness exam; for shingrix and pneumovax at pharmacy, o/w up to date                        Also saw cardiology recently Dr Alroy Aspen now retiring doing ok.  Just started new CPAP last night, tolerated ok, sleep improved with first night tx.  Pt denies chest pain, increased sob or doe, wheezing, orthopnea, PND, increased LE swelling, palpitations, dizziness or syncope.   Pt denies polydipsia, polyuria, or new focal neuro s/s.    Pt denies fever, wt loss, night sweats, loss of appetite, or other constitutional symptoms  Wt still down after 48 lb wt loss with better diet and activity.   Des also have a non tender erythema area right lateral chest all suggestive of ringworm.   Wt Readings from Last 3 Encounters:  06/30/23 165 lb (74.8 kg)  06/09/23 164 lb 9.6 oz (74.7 kg)  06/02/23 162 lb (73.5 kg)   BP Readings from Last 3 Encounters:  06/30/23 126/82  06/09/23 118/60  06/02/23 110/74   Immunization History  Administered Date(s) Administered   Influenza Split 11/05/2011   Influenza, Quadrivalent, Recombinant, Inj, Pf 11/25/2018   Influenza,inj,Quad PF,6+ Mos 11/08/2012, 10/03/2013, 01/31/2015, 12/08/2016, 11/09/2017, 12/17/2017, 01/14/2022   Influenza-Unspecified 03/23/2023   PFIZER(Purple Top)SARS-COV-2 Vaccination 04/14/2019, 05/16/2019   Td 02/09/2005, 04/15/2010   Tdap 05/10/2020   There are no preventive care reminders to display for this patient.     Past Medical History:  Diagnosis Date   Allergic rhinitis    Anal fissure    Anxiety    Asthma    Atypical chest pain    CAD (coronary artery disease) 09/01/2019   a.) cCTA 09/01/2022: Ca2+ 50 (84th %'ile for age/sex/race match control); isolated to pLAD territory; b.) cCTA 02/27/2022: Ca2+ 67.1 (83rd (84th  %'ile for age/sex/race match control); isolated to oLAD territory   Carpal tunnel syndrome    Complication of anesthesia    a.) delayed emergence; "I dont need much anesthesia"   COPD (chronic obstructive pulmonary disease) (HCC)    Coronary atherosclerosis    Diverticulosis    GERD (gastroesophageal reflux disease)    H. pylori infection    Hearing loss, right    Hiatal hernia    Hyperlipidemia    LBP (low back pain)    TMJ (dislocation of temporomandibular joint)    Umbilical hernia 10/2022   Vitamin B 12 deficiency    Vitamin D  deficiency    Past Surgical History:  Procedure Laterality Date   COLONOSCOPY     laser retinopexy Right 2015   STAPEDECTOMY Right    UMBILICAL HERNIA REPAIR N/A 11/06/2022   Procedure: HERNIA REPAIR UMBILICAL ADULT  possibly w/ mesh;  Surgeon: Conrado Delay, DO;  Location: [facility] ORS;  Service: General;  Laterality: N/A;   UPPER GI ENDOSCOPY     VASECTOMY      reports that he has never smoked. He has never used smokeless tobacco. He reports that he does not drink alcohol and does not use drugs. family history includes Allergies in an other family member; Asthma in his son; Atrial fibrillation in his father; COPD in  his maternal grandfather; Colon polyps in his maternal grandfather; Diabetes in his maternal aunt, maternal grandmother, and paternal aunt; Heart attack in his maternal grandmother; Heart failure in an other family member; Hyperlipidemia in his father and mother; Irritable bowel syndrome in his maternal aunt; Other in his mother; Ovarian cancer in his paternal grandmother; Prostate cancer in his maternal grandfather; Stroke in his maternal aunt. Allergies  Allergen Reactions   Beef (Bovine) Protein Hives    All red meat causes hives   Ciprofloxacin Other (See Comments) and Diarrhea    Bloody stool Bloody stool Stools have bloody mucous   Penicillins Rash   Sulfa Antibiotics Rash   Tiotropium Bromide Monohydrate Other (See Comments)    heart  racing and paresthesias   Corticosteroids     CSP- Central serous retinopathy    Nabumetone     weakness   Sulfonamide Derivatives    Current Outpatient Medications on File Prior to Visit  Medication Sig Dispense Refill   albuterol  (PROAIR  HFA) 108 (90 Base) MCG/ACT inhaler Inhale 2 puffs into the lungs every 4 (four) hours as needed for wheezing or shortness of breath. 18 g 2   aspirin  EC 81 MG tablet Take 1 tablet (81 mg total) by mouth daily. Swallow whole. 90 tablet 3   cetirizine (ZYRTEC) 5 MG tablet Take 5 mg by mouth daily.     Cholecalciferol (VITAMIN D3 PO) Take 1 tablet by mouth daily.     EPINEPHrine  (AUVI-Q ) 0.3 mg/0.3 mL IJ SOAJ injection Inject 0.3 mg into the muscle as needed for anaphylaxis. 1 each 1   EPINEPHrine  0.3 mg/0.3 mL IJ SOAJ injection Inject 0.3 mg into the muscle as needed for anaphylaxis. 2 each 2   fluticasone  (FLONASE ) 50 MCG/ACT nasal spray 1 spray per nostril 1-2 times daily as needed. 16 g 5   lansoprazole  (PREVACID ) 30 MG capsule Take 30 mg by mouth daily.     meloxicam  (MOBIC ) 15 MG tablet Take 15 mg by mouth daily as needed for pain.     oxyCODONE -acetaminophen  (PERCOCET) 5-325 MG tablet Take 1 tablet by mouth every 8 (eight) hours as needed for severe pain. 6 tablet 0   QVAR  REDIHALER 80 MCG/ACT inhaler 1 puff 1-2 times daily depending on disease activity. 31.8 g 3   rosuvastatin  (CRESTOR ) 20 MG tablet TAKE 1 TABLET BY MOUTH EVERY DAY 90 tablet 0   vitamin B-12 (CYANOCOBALAMIN ) 1000 MCG tablet Take 1 tablet (1,000 mcg total) by mouth daily. 90 tablet 3   No current facility-administered medications on file prior to visit.        ROS:  All others reviewed and negative.  Objective        PE:  BP 126/82 (BP Location: Right Arm, Patient Position: Sitting, Cuff Size: Normal)   Pulse (!) 53   Temp 98.1 F (36.7 C) (Oral)   Ht 5\' 6"  (1.676 m)   Wt 165 lb (74.8 kg)   SpO2 98%   BMI 26.63 kg/m                 Constitutional: Pt appears in NAD                HENT: Head: NCAT.                Right Ear: External ear normal.                 Left Ear: External ear normal.  Eyes: . Pupils are equal, round, and reactive to light. Conjunctivae and EOM are normal               Nose: without d/c or deformity               Neck: Neck supple. Gross normal ROM               Cardiovascular: Normal rate and regular rhythm.                 Pulmonary/Chest: Effort normal and breath sounds without rales or wheezing.                Abd:  Soft, NT, ND, + BS, no organomegaly               Neurological: Pt is alert. At baseline orientation, motor grossly intact               Skin: Skin is warm, LE edema - none, right lateral chest wall about t6 at the anterior axillary line erythem lesion 1.5 cm with serpiginous edges               Psychiatric: Pt behavior is normal without agitation   Micro: none  Cardiac tracings I have personally interpreted today:  none  Pertinent Radiological findings (summarize): none   Lab Results  Component Value Date   WBC 5.2 06/28/2023   HGB 13.9 06/28/2023   HCT 40.1 06/28/2023   PLT 196.0 06/28/2023   GLUCOSE 82 06/28/2023   CHOL 125 06/28/2023   TRIG 54.0 06/28/2023   HDL 51.70 06/28/2023   LDLCALC 62 06/28/2023   ALT 18 06/28/2023   AST 18 06/28/2023   NA 139 06/28/2023   K 4.1 06/28/2023   CL 103 06/28/2023   CREATININE 0.76 06/28/2023   BUN 19 06/28/2023   CO2 28 06/28/2023   TSH 1.64 06/28/2023   PSA 0.33 06/28/2023   HGBA1C 5.4 06/28/2023   MICROALBUR <0.7 06/28/2023   Assessment/Plan:  Billy Morris is a 53 y.o. White or Caucasian [1] male with  has a past medical history of Allergic rhinitis, Anal fissure, Anxiety, Asthma, Atypical chest pain, CAD (coronary artery disease) (09/01/2019), Carpal tunnel syndrome, Complication of anesthesia, COPD (chronic obstructive pulmonary disease) (HCC), Coronary atherosclerosis, Diverticulosis, GERD (gastroesophageal reflux disease), H. pylori infection,  Hearing loss, right, Hiatal hernia, Hyperlipidemia, LBP (low back pain), TMJ (dislocation of temporomandibular joint), Umbilical hernia (10/2022), Vitamin B 12 deficiency, and Vitamin D  deficiency.  Encounter for well adult exam with abnormal findings Age and sex appropriate education and counseling updated with regular exercise and diet Referrals for preventative services - none needed Immunizations addressed - for shingrix and prevnar 20 at pharmacy Smoking counseling  - none needed Evidence for depression or other mood disorder - none significant Most recent labs reviewed. I have personally reviewed and have noted: 1) the patient's medical and social history 2) The patient's current medications and supplements 3) The patient's height, weight, and BMI have been recorded in the chart   HLD (hyperlipidemia) Lab Results  Component Value Date   LDLCALC 62 06/28/2023   Stable, pt to continue current statin crestor  20 mg qd   B12 deficiency Lab Results  Component Value Date   VITAMINB12 424 06/28/2023   Stable, cont oral replacement - b12 1000 mcg qd   Vitamin D  deficiency Last vitamin D  Lab Results  Component Value Date   VD25OH 27.91 (L) 06/28/2023   Low, to start  oral replacement   Hyperglycemia Lab Results  Component Value Date   HGBA1C 5.4 06/28/2023   Stable, pt to continue current medical treatment  - diet, wt control   Skin lesion of chest wall Likely ringworm - for ketoconozole cr every day prn, pt states will call his derm in [place] if not improved  Followup: Return in about 1 year (around 06/29/2024).  Rosalia Colonel, MD 07/01/2023 7:20 PM [facility]

## 2023-07-01 ENCOUNTER — Encounter: Payer: Self-pay | Admitting: Internal Medicine

## 2023-07-01 NOTE — Assessment & Plan Note (Signed)
 Last vitamin D  Lab Results  Component Value Date   VD25OH 27.91 (L) 06/28/2023   Low, to start oral replacement

## 2023-07-01 NOTE — Assessment & Plan Note (Signed)
 Lab Results  Component Value Date   HGBA1C 5.4 06/28/2023   Stable, pt to continue current medical treatment  - diet, wt control

## 2023-07-01 NOTE — Assessment & Plan Note (Signed)
 Likely ringworm - for ketoconozole cr every day prn, pt states will call his derm in [place] if not improved

## 2023-07-01 NOTE — Assessment & Plan Note (Signed)
 Age and sex appropriate education and counseling updated with regular exercise and diet Referrals for preventative services - none needed Immunizations addressed - for shingrix and prevnar 20 at pharmacy Smoking counseling  - none needed Evidence for depression or other mood disorder - none significant Most recent labs reviewed. I have personally reviewed and have noted: 1) the patient's medical and social history 2) The patient's current medications and supplements 3) The patient's height, weight, and BMI have been recorded in the chart

## 2023-07-01 NOTE — Assessment & Plan Note (Signed)
 Lab Results  Component Value Date   LDLCALC 62 06/28/2023   Stable, pt to continue current statin crestor  20 mg qd

## 2023-07-01 NOTE — Assessment & Plan Note (Signed)
 Lab Results  Component Value Date   VITAMINB12 424 06/28/2023   Stable, cont oral replacement - b12 1000 mcg qd

## 2023-07-03 ENCOUNTER — Encounter: Payer: Self-pay | Admitting: Nurse Practitioner

## 2023-08-25 ENCOUNTER — Other Ambulatory Visit: Payer: Self-pay | Admitting: Cardiovascular Disease

## 2023-08-30 ENCOUNTER — Encounter: Payer: Self-pay | Admitting: Internal Medicine

## 2023-08-31 ENCOUNTER — Ambulatory Visit: Admitting: Internal Medicine

## 2023-08-31 VITALS — BP 108/66 | HR 63 | Temp 98.4°F | Ht 66.0 in | Wt 163.6 lb

## 2023-08-31 DIAGNOSIS — R739 Hyperglycemia, unspecified: Secondary | ICD-10-CM | POA: Diagnosis not present

## 2023-08-31 DIAGNOSIS — R21 Rash and other nonspecific skin eruption: Secondary | ICD-10-CM

## 2023-08-31 DIAGNOSIS — R062 Wheezing: Secondary | ICD-10-CM

## 2023-08-31 DIAGNOSIS — R059 Cough, unspecified: Secondary | ICD-10-CM | POA: Insufficient documentation

## 2023-08-31 DIAGNOSIS — R051 Acute cough: Secondary | ICD-10-CM

## 2023-08-31 MED ORDER — PREDNISONE 10 MG PO TABS: ORAL_TABLET | ORAL | 0 refills | Status: AC

## 2023-08-31 MED ORDER — METHYLPREDNISOLONE ACETATE 40 MG/ML IJ SUSP
40.0000 mg | Freq: Once | INTRAMUSCULAR | Status: AC
  Administered 2023-08-31: 40 mg via INTRAMUSCULAR

## 2023-08-31 NOTE — Progress Notes (Signed)
 Patient ID: HODARI CHUBA, male   DOB: [birth year], 53 y.o.   MRN: [id number]        Chief Complaint: follow up blistering rash, cough and wheezing x 2 days       HPI:  Billy Morris is a 53 y.o. male here with c/o above after [place].  Friend seems fine, but now he has what seems to be worsening rash with variable sized blistering to extremities and some to torso as well.  Also, Here with acute onset mild to mod 2-3 days ST, HA, general weakness and malaise, with prod cough greenish sputum, but Pt denies chest pain, orthopnea, PND, increased LE swelling, palpitations, dizziness or syncope but has mild wheezing and sob x 1 day       Wt Readings from Last 3 Encounters:  08/31/23 163 lb 9.6 oz (74.2 kg)  06/30/23 165 lb (74.8 kg)  06/09/23 164 lb 9.6 oz (74.7 kg)   BP Readings from Last 3 Encounters:  08/31/23 108/66  06/30/23 126/82  06/09/23 118/60         Past Medical History:  Diagnosis Date   Allergic rhinitis    Anal fissure    Anxiety    Asthma    Atypical chest pain    CAD (coronary artery disease) 09/01/2019   a.) cCTA 09/01/2022: Ca2+ 50 (84th %'ile for age/sex/race match control); isolated to pLAD territory; b.) cCTA 02/27/2022: Ca2+ 67.1 (83rd (84th %'ile for age/sex/race match control); isolated to oLAD territory   Carpal tunnel syndrome    Complication of anesthesia    a.) delayed emergence; I dont need much anesthesia   COPD (chronic obstructive pulmonary disease) (HCC)    Coronary atherosclerosis    Diverticulosis    GERD (gastroesophageal reflux disease)    H. pylori infection    Hearing loss, right    Hiatal hernia    Hyperlipidemia    LBP (low back pain)    TMJ (dislocation of temporomandibular joint)    Umbilical hernia 10/2022   Vitamin B 12 deficiency    Vitamin D  deficiency    Past Surgical History:  Procedure Laterality Date   COLONOSCOPY     laser retinopexy Right 2015   STAPEDECTOMY Right    UMBILICAL HERNIA  REPAIR N/A 11/06/2022   Procedure: HERNIA REPAIR UMBILICAL ADULT  possibly w/ mesh;  Surgeon: Tye Millet, DO;  Location: [facility] ORS;  Service: General;  Laterality: N/A;   UPPER GI ENDOSCOPY     VASECTOMY      reports that he has never smoked. He has never used smokeless tobacco. He reports that he does not drink alcohol and does not use drugs. family history includes Allergies in an other family member; Asthma in his son; Atrial fibrillation in his father; COPD in his maternal grandfather; Colon polyps in his maternal grandfather; Diabetes in his maternal aunt, maternal grandmother, and paternal aunt; Heart attack in his maternal grandmother; Heart failure in an other family member; Hyperlipidemia in his father and mother; Irritable bowel syndrome in his maternal aunt; Other in his mother; Ovarian cancer in his paternal grandmother; Prostate cancer in his maternal grandfather; Stroke in his maternal aunt. Allergies  Allergen Reactions   Beef (Bovine) Protein Hives    All red meat causes hives   Ciprofloxacin Other (See Comments) and Diarrhea    Bloody stool Bloody stool Stools have bloody mucous   Penicillins Rash   Sulfa Antibiotics Rash  Tiotropium Bromide Monohydrate Other (See Comments)    heart racing and paresthesias   Corticosteroids     CSP- Central serous retinopathy    Nabumetone     weakness   Sulfonamide Derivatives    Current Outpatient Medications on File Prior to Visit  Medication Sig Dispense Refill   albuterol  (PROAIR  HFA) 108 (90 Base) MCG/ACT inhaler Inhale 2 puffs into the lungs every 4 (four) hours as needed for wheezing or shortness of breath. 18 g 2   aspirin  EC 81 MG tablet Take 1 tablet (81 mg total) by mouth daily. Swallow whole. 90 tablet 3   cetirizine (ZYRTEC) 5 MG tablet Take 5 mg by mouth daily.     Cholecalciferol (VITAMIN D3 PO) Take 1 tablet by mouth daily.     EPINEPHrine  (AUVI-Q ) 0.3 mg/0.3 mL IJ SOAJ injection Inject 0.3 mg into the muscle as  needed for anaphylaxis. 1 each 1   EPINEPHrine  0.3 mg/0.3 mL IJ SOAJ injection Inject 0.3 mg into the muscle as needed for anaphylaxis. 2 each 2   fluticasone  (FLONASE ) 50 MCG/ACT nasal spray 1 spray per nostril 1-2 times daily as needed. 16 g 5   ketoconazole  (NIZORAL ) 2 % cream Apply 1 Application topically daily. 15 g 0   lansoprazole  (PREVACID ) 30 MG capsule Take 30 mg by mouth daily.     meloxicam  (MOBIC ) 15 MG tablet Take 15 mg by mouth daily as needed for pain.     oxyCODONE -acetaminophen  (PERCOCET) 5-325 MG tablet Take 1 tablet by mouth every 8 (eight) hours as needed for severe pain. 6 tablet 0   predniSONE  (DELTASONE ) 10 MG tablet 3 tabs by mouth per day for 3 days,2tabs per day for 3 days,1tab per day for 3 days 18 tablet 0   QVAR  REDIHALER 80 MCG/ACT inhaler 1 puff 1-2 times daily depending on disease activity. 31.8 g 3   rosuvastatin  (CRESTOR ) 20 MG tablet TAKE 1 TABLET BY MOUTH EVERY DAY 90 tablet 3   vitamin B-12 (CYANOCOBALAMIN ) 1000 MCG tablet Take 1 tablet (1,000 mcg total) by mouth daily. 90 tablet 3   No current facility-administered medications on file prior to visit.        ROS:  All others reviewed and negative.  Objective        PE:  BP 108/66   Pulse 63   Temp 98.4 F (36.9 C)   Ht 5' 6 (1.676 m)   Wt 163 lb 9.6 oz (74.2 kg)   SpO2 99%   BMI 26.41 kg/m                 Constitutional: Pt appears mild ill               HENT: Head: NCAT.                Right Ear: External ear normal.                 Left Ear: External ear normal. Bilat tm's with mild erythema.  Max sinus areas non tender.  Pharynx with mild erythema, no exudate               Eyes: . Pupils are equal, round, and reactive to light. Conjunctivae and EOM are normal               Nose: without d/c or deformity               Neck: Neck supple. Gross normal ROM  Cardiovascular: Normal rate and regular rhythm.                 Pulmonary/Chest: Effort normal and breath sounds decreased  without rales but with with few mild wheezing.                Neurological: Pt is alert. At baseline orientation, motor grossly intact               Skin: Skin is warm. LE edema - none, has numerous blistering itchy lesions to extremities and lower abdomen               Psychiatric: Pt behavior is normal without agitation   Micro: none  Cardiac tracings I have personally interpreted today:  none  Pertinent Radiological findings (summarize): none   Lab Results  Component Value Date   WBC 5.2 06/28/2023   HGB 13.9 06/28/2023   HCT 40.1 06/28/2023   PLT 196.0 06/28/2023   GLUCOSE 82 06/28/2023   CHOL 125 06/28/2023   TRIG 54.0 06/28/2023   HDL 51.70 06/28/2023   LDLCALC 62 06/28/2023   ALT 18 06/28/2023   AST 18 06/28/2023   NA 139 06/28/2023   K 4.1 06/28/2023   CL 103 06/28/2023   CREATININE 0.76 06/28/2023   BUN 19 06/28/2023   CO2 28 06/28/2023   TSH 1.64 06/28/2023   PSA 0.33 06/28/2023   HGBA1C 5.4 06/28/2023   MICROALBUR <0.7 06/28/2023   Assessment/Plan:  CHARLETON DEYOUNG is a 53 y.o. White or Caucasian [1] male with  has a past medical history of Allergic rhinitis, Anal fissure, Anxiety, Asthma, Atypical chest pain, CAD (coronary artery disease) (09/01/2019), Carpal tunnel syndrome, Complication of anesthesia, COPD (chronic obstructive pulmonary disease) (HCC), Coronary atherosclerosis, Diverticulosis, GERD (gastroesophageal reflux disease), H. pylori infection, Hearing loss, right, Hiatal hernia, Hyperlipidemia, LBP (low back pain), TMJ (dislocation of temporomandibular joint), Umbilical hernia (10/2022), Vitamin B 12 deficiency, and Vitamin D  deficiency.  Blistering rash Etiology unclear, c/w inflammatory rash can't r/o insect or contact dermatitis - for prednisone  taper  Cough Mild to mod, for antibx course doxycycline  100 mg bid course,  to f/u any worsening symptoms or concerns   Wheezing Mild to mod, for depomedrol 80 mg IM,,  to f/u any worsening symptoms or  concerns   Hyperglycemia Lab Results  Component Value Date   HGBA1C 5.4 06/28/2023   Stable, pt to continue current medical treatment - diet, wt control  Followup: Return if symptoms worsen or fail to improve.  Lynwood Rush, MD 08/31/2023 5:00 PM [facility]

## 2023-08-31 NOTE — Assessment & Plan Note (Signed)
Mild to mod, for depomedrol 80 mg IM,,  to f/u any worsening symptoms or concerns

## 2023-08-31 NOTE — Assessment & Plan Note (Signed)
 Mild to mod, for antibx course doxycycline  100 mg bid course,  to f/u any worsening symptoms or concerns

## 2023-08-31 NOTE — Assessment & Plan Note (Signed)
 Etiology unclear, c/w inflammatory rash can't r/o insect or contact dermatitis - for prednisone  taper

## 2023-08-31 NOTE — Assessment & Plan Note (Signed)
 Lab Results  Component Value Date   HGBA1C 5.4 06/28/2023   Stable, pt to continue current medical treatment  - diet, wt control

## 2023-08-31 NOTE — Patient Instructions (Signed)
You had the steroid shot today  Please take all new medication as prescribed - the antibiotic, and prednisone  You can also take Delsym OTC for cough, and/or Mucinex (or it's generic off brand) for congestion, and tylenol as needed for pain.  Please continue all other medications as before, and refills have been done if requested.  Please have the pharmacy call with any other refills you may need.  Please keep your appointments with your specialists as you may have planned   

## 2023-09-01 MED ORDER — DOXYCYCLINE HYCLATE 100 MG PO TABS: 100.0000 mg | ORAL_TABLET | Freq: Two times a day (BID) | ORAL | 0 refills | Status: DC

## 2023-09-01 NOTE — Addendum Note (Signed)
 Addended by: NORLEEN LYNWOOD ORN on: 09/01/2023 09:34 AM   Modules accepted: Orders

## 2023-09-09 ENCOUNTER — Encounter: Payer: Self-pay | Admitting: Sleep Medicine

## 2023-09-09 ENCOUNTER — Ambulatory Visit: Admitting: Sleep Medicine

## 2023-09-09 VITALS — BP 110/66 | HR 62 | Temp 98.8°F | Ht 64.0 in | Wt 163.6 lb

## 2023-09-09 DIAGNOSIS — G4733 Obstructive sleep apnea (adult) (pediatric): Secondary | ICD-10-CM | POA: Diagnosis not present

## 2023-09-09 DIAGNOSIS — K2100 Gastro-esophageal reflux disease with esophagitis, without bleeding: Secondary | ICD-10-CM | POA: Diagnosis not present

## 2023-09-09 NOTE — Patient Instructions (Addendum)

## 2023-09-09 NOTE — Progress Notes (Signed)
 Name:Billy Morris MRN: [id number] DOB: 1971-01-14   CHIEF COMPLAINT:  CPAP F/U   HISTORY OF PRESENT ILLNESS:  Billy Morris is a 53 y.o. w/ a h/o OSA, GERD and asthma who presents for CPAP follow up visit. Reports using CPAP therapy every night, which is confirmed by compliance data. He is currently using the Airtouch F20 FFM, which is comfortable. Reports feeling significantly more refreshed upon awakening with CPAP therapy.    EPWORTH SLEEP SCORE     05/04/2023    3:00 PM  Results of the Epworth flowsheet  Sitting and reading 3  Watching TV 2  Sitting, inactive in a public place (e.g. a theatre or a meeting) 0  As a passenger in a car for an hour without a break 3  Lying down to rest in the afternoon when circumstances permit 3  Sitting and talking to someone 0  Sitting quietly after a lunch without alcohol 1  In a car, while stopped for a few minutes in traffic 0  Total score 12    PAST MEDICAL HISTORY :   has a past medical history of Allergic rhinitis, Allergy , Anal fissure, Anxiety, Asthma, Atypical chest pain, CAD (coronary artery disease) (09/01/2019), Carpal tunnel syndrome, Complication of anesthesia, COPD (chronic obstructive pulmonary disease) (HCC), Coronary atherosclerosis, Diverticulosis, GERD (gastroesophageal reflux disease), H. pylori infection, Hearing loss, right, Hiatal hernia, Hyperlipidemia, LBP (low back pain), TMJ (dislocation of temporomandibular joint), Umbilical hernia (10/2022), Vitamin B 12 deficiency, and Vitamin D  deficiency.  has a past surgical history that includes Vasectomy; Stapedectomy (Right); laser retinopexy (Right, 2015); Colonoscopy; Upper gi endoscopy; Umbilical hernia repair (N/A, 11/06/2022); and Hernia repair. Prior to Admission medications   Medication Sig Start Date End Date Taking? Authorizing Provider  albuterol  (PROAIR  HFA) 108 (90 Base) MCG/ACT inhaler Inhale 2 puffs into the lungs every 4 (four) hours as needed for wheezing or  shortness of breath. 03/23/23  Yes Kozlow, Camellia PARAS, MD  aspirin  EC 81 MG tablet Take 1 tablet (81 mg total) by mouth daily. Swallow whole. 02/23/22  Yes Nahser, Aleene PARAS, MD  cetirizine (ZYRTEC) 5 MG tablet Take 5 mg by mouth daily.   Yes [provider]  Cholecalciferol (VITAMIN D3 PO) Take 1 tablet by mouth daily.   Yes [provider]  doxycycline  (VIBRA -TABS) 100 MG tablet Take 1 tablet (100 mg total) by mouth 2 (two) times daily. 09/01/23  Yes Norleen Lynwood ORN, MD  EPINEPHrine  (AUVI-Q ) 0.3 mg/0.3 mL IJ SOAJ injection Inject 0.3 mg into the muscle as needed for anaphylaxis. 03/10/22  Yes Kozlow, Camellia PARAS, MD  EPINEPHrine  0.3 mg/0.3 mL IJ SOAJ injection Inject 0.3 mg into the muscle as needed for anaphylaxis. 03/23/23  Yes Kozlow, Camellia PARAS, MD  fluticasone  (FLONASE ) 50 MCG/ACT nasal spray 1 spray per nostril 1-2 times daily as needed. 03/10/22  Yes Kozlow, Camellia PARAS, MD  ketoconazole  (NIZORAL ) 2 % cream Apply 1 Application topically daily. 06/30/23  Yes Norleen Lynwood ORN, MD  lansoprazole  (PREVACID ) 30 MG capsule Take 30 mg by mouth daily.   Yes [provider]  meloxicam  (MOBIC ) 15 MG tablet Take 15 mg by mouth daily as needed for pain.   Yes [provider]  oxyCODONE -acetaminophen  (PERCOCET) 5-325 MG tablet Take 1 tablet by mouth every 8 (eight) hours as needed for severe pain. 11/06/22 11/06/23 Yes Sakai, Isami, DO  predniSONE  (DELTASONE ) 10 MG tablet 3 tabs by mouth per day for 3 days,2tabs per day for 3 days,1tab  per day for 3 days 08/31/23  Yes Norleen Lynwood ORN, MD  QVAR  REDIHALER 80 MCG/ACT inhaler 1 puff 1-2 times daily depending on disease activity. 03/23/23  Yes Kozlow, Camellia PARAS, MD  rosuvastatin  (CRESTOR ) 20 MG tablet TAKE 1 TABLET BY MOUTH EVERY DAY 08/26/23  Yes Nahser, Aleene PARAS, MD  vitamin B-12 (CYANOCOBALAMIN ) 1000 MCG tablet Take 1 tablet (1,000 mcg total) by mouth daily. 05/06/19  Yes Norleen Lynwood ORN, MD   Allergies  Allergen Reactions   Beef (Bovine) Protein Hives    All  red meat causes hives   Ciprofloxacin Other (See Comments) and Diarrhea    Bloody stool Bloody stool Stools have bloody mucous   Penicillins Rash   Sulfa Antibiotics Rash   Tiotropium Bromide Monohydrate Other (See Comments)    heart racing and paresthesias   Corticosteroids     CSP- Central serous retinopathy    Nabumetone     weakness   Sulfonamide Derivatives     FAMILY HISTORY:  family history includes Allergies in an other family member; Anxiety disorder in his mother; Asthma in his father and son; Atrial fibrillation in his father; COPD in his maternal grandfather; Colon polyps in his maternal grandfather; Diabetes in his maternal aunt, maternal grandmother, and paternal aunt; Heart attack in his maternal grandmother; Heart failure in an other family member; Hyperlipidemia in his father and mother; Irritable bowel syndrome in his maternal aunt; Other in his mother; Ovarian cancer in his paternal grandmother; Prostate cancer in his maternal grandfather; Stroke in his maternal aunt. SOCIAL HISTORY:  reports that he has never smoked. He has never used smokeless tobacco. He reports that he does not drink alcohol and does not use drugs.   Review of Systems:  Gen:  Denies  fever, sweats, chills weight loss  HEENT: Denies blurred vision, double vision, ear pain, eye pain, hearing loss, nose bleeds, sore throat Cardiac:  No dizziness, chest pain or heaviness, chest tightness,edema, No JVD Resp:   No cough, -sputum production, -shortness of breath,-wheezing, -hemoptysis,  Gi: Denies swallowing difficulty, stomach pain, nausea or vomiting, diarrhea, constipation, bowel incontinence Gu:  Denies bladder incontinence, burning urine Ext:   Denies Joint pain, stiffness or swelling Skin: Denies  skin rash, easy bruising or bleeding or hives Endoc:  Denies polyuria, polydipsia , polyphagia or weight change Psych:   Denies depression, insomnia or hallucinations  Other:  All other systems  negative  VITAL SIGNS: BP 110/66 (BP Location: Right Arm, Patient Position: Sitting, Cuff Size: Normal)   Pulse 62   Temp 98.8 F (37.1 C) (Oral)   Ht 5' 4 (1.626 m)   Wt 163 lb 9.6 oz (74.2 kg)   SpO2 99%   BMI 28.08 kg/m    Physical Examination:   General Appearance: No distress  EYES PERRLA, EOM intact.   NECK Supple, No JVD Pulmonary: normal breath sounds, No wheezing.  CardiovascularNormal S1,S2.  No m/r/g.   Abdomen: Benign, Soft, non-tender. Skin:   warm, no rashes, no ecchymosis  Extremities: normal, no cyanosis, clubbing. Neuro:without focal findings,  speech normal  PSYCHIATRIC: Mood, affect within normal limits.   ASSESSMENT AND PLAN  OSA Patient is using and benefiting from CPAP therapy. Discussed the consequences of untreated sleep apnea. Advised not to drive drowsy for safety of patient and others. Will follow up in 6 months.    GERD Stable, on current management. Following with PCP.    Patient  satisfied with Plan of action and management. All questions answered  I spent a total of 27 minutes reviewing chart data, face-to-face evaluation with the patient, counseling and coordination of care as detailed above.    Yer Olivencia, M.D.  Sleep Medicine [facility]

## 2023-11-03 ENCOUNTER — Ambulatory Visit (INDEPENDENT_AMBULATORY_CARE_PROVIDER_SITE_OTHER): Admitting: Internal Medicine

## 2023-11-03 ENCOUNTER — Encounter: Payer: Self-pay | Admitting: Internal Medicine

## 2023-11-03 VITALS — BP 115/66 | HR 69 | Temp 98.5°F | Ht 64.0 in | Wt 167.6 lb

## 2023-11-03 DIAGNOSIS — E559 Vitamin D deficiency, unspecified: Secondary | ICD-10-CM | POA: Diagnosis not present

## 2023-11-03 DIAGNOSIS — R739 Hyperglycemia, unspecified: Secondary | ICD-10-CM | POA: Diagnosis not present

## 2023-11-03 DIAGNOSIS — I83811 Varicose veins of right lower extremities with pain: Secondary | ICD-10-CM | POA: Diagnosis not present

## 2023-11-03 DIAGNOSIS — M1711 Unilateral primary osteoarthritis, right knee: Secondary | ICD-10-CM

## 2023-11-03 NOTE — Assessment & Plan Note (Signed)
 Lab Results  Component Value Date   HGBA1C 5.4 06/28/2023   Stable, pt to continue current medical treatment  - diet, wt control

## 2023-11-03 NOTE — Assessment & Plan Note (Signed)
 Last vitamin D  Lab Results  Component Value Date   VD25OH 27.91 (L) 06/28/2023   Low, to start oral replacement

## 2023-11-03 NOTE — Patient Instructions (Signed)
 Ok to use the OTC Voltaren gel as needed for the right knee pain  Please continue all other medications as before, and refills have been done if requested.  Please have the pharmacy call with any other refills you may need.  Please keep your appointments with your specialists as you may have planned  You will be contacted regarding the referral for: Vein specialist

## 2023-11-03 NOTE — Assessment & Plan Note (Signed)
 Worsening recent, for vascular surgury referral

## 2023-11-03 NOTE — Progress Notes (Signed)
 Patient ID: PHU RECORD, male   DOB: 1970-03-17, 53 y.o.   MRN: [id number]        Chief Complaint: follow up right leg painful varicose veins, right knee arthritis, low vit d and b12, hyperglycemia       HPI:  Billy Morris is a 53 y.o. male here with right knee pain mild intermittent but with mild effusion today after squats for exercise.  Also has painful varicosities to right post calf without phlebitis it seems.  Pt denies chest pain, increased sob or doe, wheezing, orthopnea, PND, increased LE swelling, palpitations, dizziness or syncope.   Pt denies polydipsia, polyuria, or new focal neuro s/s.    Pt denies fever, wt loss, night sweats, loss of appetite, or other constitutional symptoms         Wt Readings from Last 3 Encounters:  11/03/23 167 lb 9.6 oz (76 kg)  09/09/23 163 lb 9.6 oz (74.2 kg)  08/31/23 163 lb 9.6 oz (74.2 kg)   BP Readings from Last 3 Encounters:  11/03/23 115/66  09/09/23 110/66  08/31/23 108/66         Past Medical History:  Diagnosis Date   Allergic rhinitis    Allergy     Anal fissure    Anxiety    Asthma    Atypical chest pain    CAD (coronary artery disease) 09/01/2019   a.) cCTA 09/01/2022: Ca2+ 50 (84th %'ile for age/sex/race match control); isolated to pLAD territory; b.) cCTA 02/27/2022: Ca2+ 67.1 (83rd (84th %'ile for age/sex/race match control); isolated to oLAD territory   Carpal tunnel syndrome    Complication of anesthesia    a.) delayed emergence; I dont need much anesthesia   COPD (chronic obstructive pulmonary disease) (HCC)    Coronary atherosclerosis    Diverticulosis    GERD (gastroesophageal reflux disease)    H. pylori infection    Hearing loss, right    Hiatal hernia    Hyperlipidemia    LBP (low back pain)    TMJ (dislocation of temporomandibular joint)    Umbilical hernia 10/2022   Vitamin B 12 deficiency    Vitamin D  deficiency    Past Surgical History:  Procedure Laterality Date   COLONOSCOPY     HERNIA REPAIR      laser retinopexy Right 2015   STAPEDECTOMY Right    UMBILICAL HERNIA REPAIR N/A 11/06/2022   Procedure: HERNIA REPAIR UMBILICAL ADULT  possibly w/ mesh;  Surgeon: Tye Millet, DO;  Location: [facility] ORS;  Service: General;  Laterality: N/A;   UPPER GI ENDOSCOPY     VASECTOMY      reports that he has never smoked. He has never used smokeless tobacco. He reports that he does not drink alcohol and does not use drugs. family history includes Allergies in an other family member; Anxiety disorder in his mother; Asthma in his father and son; Atrial fibrillation in his father; COPD in his maternal grandfather; Colon polyps in his maternal grandfather; Diabetes in his maternal aunt, maternal grandmother, and paternal aunt; Heart attack in his maternal grandmother; Heart failure in an other family member; Hyperlipidemia in his father and mother; Irritable bowel syndrome in his maternal aunt; Other in his mother; Ovarian cancer in his paternal grandmother; Prostate cancer in his maternal grandfather; Stroke in his maternal aunt. Allergies  Allergen Reactions   Beef (Bovine) Protein Hives    All red meat causes hives   Ciprofloxacin Other (See Comments) and Diarrhea    Bloody stool  Bloody stool Stools have bloody mucous   Penicillins Rash   Sulfa Antibiotics Rash   Tiotropium Bromide Monohydrate Other (See Comments)    heart racing and paresthesias   Corticosteroids     CSP- Central serous retinopathy    Nabumetone     weakness   Sulfonamide Derivatives    Current Outpatient Medications on File Prior to Visit  Medication Sig Dispense Refill   albuterol  (PROAIR  HFA) 108 (90 Base) MCG/ACT inhaler Inhale 2 puffs into the lungs every 4 (four) hours as needed for wheezing or shortness of breath. 18 g 2   aspirin  EC 81 MG tablet Take 1 tablet (81 mg total) by mouth daily. Swallow whole. 90 tablet 3   cetirizine (ZYRTEC) 5 MG tablet Take 5 mg by mouth daily.     Cholecalciferol (VITAMIN D3 PO) Take 1  tablet by mouth daily.     EPINEPHrine  (AUVI-Q ) 0.3 mg/0.3 mL IJ SOAJ injection Inject 0.3 mg into the muscle as needed for anaphylaxis. 1 each 1   EPINEPHrine  0.3 mg/0.3 mL IJ SOAJ injection Inject 0.3 mg into the muscle as needed for anaphylaxis. 2 each 2   fluticasone  (FLONASE ) 50 MCG/ACT nasal spray 1 spray per nostril 1-2 times daily as needed. 16 g 5   ketoconazole  (NIZORAL ) 2 % cream Apply 1 Application topically daily. 15 g 0   lansoprazole  (PREVACID ) 30 MG capsule Take 30 mg by mouth daily.     predniSONE  (DELTASONE ) 10 MG tablet 3 tabs by mouth per day for 3 days,2tabs per day for 3 days,1tab per day for 3 days 18 tablet 0   QVAR  REDIHALER 80 MCG/ACT inhaler 1 puff 1-2 times daily depending on disease activity. 31.8 g 3   rosuvastatin  (CRESTOR ) 20 MG tablet TAKE 1 TABLET BY MOUTH EVERY DAY 90 tablet 3   vitamin B-12 (CYANOCOBALAMIN ) 1000 MCG tablet Take 1 tablet (1,000 mcg total) by mouth daily. 90 tablet 3   doxycycline  (VIBRA -TABS) 100 MG tablet Take 1 tablet (100 mg total) by mouth 2 (two) times daily. (Patient not taking: Reported on 11/03/2023) 20 tablet 0   meloxicam  (MOBIC ) 15 MG tablet Take 15 mg by mouth daily as needed for pain. (Patient not taking: Reported on 11/03/2023)     oxyCODONE -acetaminophen  (PERCOCET) 5-325 MG tablet Take 1 tablet by mouth every 8 (eight) hours as needed for severe pain. (Patient not taking: Reported on 11/03/2023) 6 tablet 0   No current facility-administered medications on file prior to visit.        ROS:  All others reviewed and negative.  Objective        PE:  BP 115/66 (BP Location: Left Arm, Patient Position: Sitting)   Pulse 69   Temp 98.5 F (36.9 C) (Temporal)   Ht 5' 4 (1.626 m)   Wt 167 lb 9.6 oz (76 kg)   SpO2 99%   BMI 28.77 kg/m                 Constitutional: Pt appears in NAD               HENT: Head: NCAT.                Right Ear: External ear normal.                 Left Ear: External ear normal.                Eyes: .  Pupils are equal, round, and reactive  to light. Conjunctivae and EOM are normal               Nose: without d/c or deformity               Neck: Neck supple. Gross normal ROM               Cardiovascular: Normal rate and regular rhythm.                 Pulmonary/Chest: Effort normal and breath sounds without rales or wheezing.                Abd:  Soft, NT, ND, + BS, no organomegaly               Neurological: Pt is alert. At baseline orientation, motor grossly intact               Skin: Skin is warm. No rashes, no other new lesions, LE edema - trace RLE with large varicose veins right postlateral calfl right knee with trace effusion and warmth with crepitus               Psychiatric: Pt behavior is normal without agitation   Micro: none  Cardiac tracings I have personally interpreted today:  none  Pertinent Radiological findings (summarize): none   Lab Results  Component Value Date   WBC 5.2 06/28/2023   HGB 13.9 06/28/2023   HCT 40.1 06/28/2023   PLT 196.0 06/28/2023   GLUCOSE 82 06/28/2023   CHOL 125 06/28/2023   TRIG 54.0 06/28/2023   HDL 51.70 06/28/2023   LDLCALC 62 06/28/2023   ALT 18 06/28/2023   AST 18 06/28/2023   NA 139 06/28/2023   K 4.1 06/28/2023   CL 103 06/28/2023   CREATININE 0.76 06/28/2023   BUN 19 06/28/2023   CO2 28 06/28/2023   TSH 1.64 06/28/2023   PSA 0.33 06/28/2023   HGBA1C 5.4 06/28/2023   MICROALBUR <0.7 06/28/2023   Assessment/Plan:  Billy Morris is a 53 y.o. White or Caucasian [1] male with  has a past medical history of Allergic rhinitis, Allergy , Anal fissure, Anxiety, Asthma, Atypical chest pain, CAD (coronary artery disease) (09/01/2019), Carpal tunnel syndrome, Complication of anesthesia, COPD (chronic obstructive pulmonary disease) (HCC), Coronary atherosclerosis, Diverticulosis, GERD (gastroesophageal reflux disease), H. pylori infection, Hearing loss, right, Hiatal hernia, Hyperlipidemia, LBP (low back pain), TMJ (dislocation of  temporomandibular joint), Umbilical hernia (10/2022), Vitamin B 12 deficiency, and Vitamin D  deficiency.  Vitamin D  deficiency Last vitamin D  Lab Results  Component Value Date   VD25OH 27.91 (L) 06/28/2023   Low, to start oral replacement   Varicose veins of right lower extremity with pain Worsening recent, for vascular surgury referral  Hyperglycemia Lab Results  Component Value Date   HGBA1C 5.4 06/28/2023   Stable, pt to continue current medical treatment  - diet, wt control   Arthritis of right knee Mild worsening overall, for otc voltaren gel prn  Followup: Return if symptoms worsen or fail to improve.  Lynwood Rush, MD 11/03/2023 8:53 PM [facility]

## 2023-11-03 NOTE — Assessment & Plan Note (Signed)
 Mild worsening overall, for otc voltaren gel prn

## 2023-11-29 ENCOUNTER — Encounter: Payer: Self-pay | Admitting: Internal Medicine

## 2023-11-29 ENCOUNTER — Ambulatory Visit: Admitting: Internal Medicine

## 2023-11-29 ENCOUNTER — Ambulatory Visit (INDEPENDENT_AMBULATORY_CARE_PROVIDER_SITE_OTHER)

## 2023-11-29 VITALS — BP 124/82 | HR 65 | Temp 98.5°F | Ht 64.0 in | Wt 164.0 lb

## 2023-11-29 DIAGNOSIS — F411 Generalized anxiety disorder: Secondary | ICD-10-CM | POA: Diagnosis not present

## 2023-11-29 DIAGNOSIS — E538 Deficiency of other specified B group vitamins: Secondary | ICD-10-CM | POA: Diagnosis not present

## 2023-11-29 DIAGNOSIS — R739 Hyperglycemia, unspecified: Secondary | ICD-10-CM | POA: Diagnosis not present

## 2023-11-29 DIAGNOSIS — Z23 Encounter for immunization: Secondary | ICD-10-CM | POA: Diagnosis not present

## 2023-11-29 DIAGNOSIS — R0789 Other chest pain: Secondary | ICD-10-CM

## 2023-11-29 DIAGNOSIS — E559 Vitamin D deficiency, unspecified: Secondary | ICD-10-CM | POA: Diagnosis not present

## 2023-11-29 MED ORDER — CITALOPRAM HYDROBROMIDE 20 MG PO TABS: 20.0000 mg | ORAL_TABLET | Freq: Every day | ORAL | 3 refills | Status: AC

## 2023-11-29 NOTE — Patient Instructions (Addendum)
 Please take all new medication as prescribed - the celexa    Please continue all other medications as before, and refills have been done if requested.  Please have the pharmacy call with any other refills you may need.  Please continue your efforts at being more active, low cholesterol diet, and weight control.  Please keep your appointments with your specialists as you may have planned  You EKG was done today  Please go to the XRAY Department in the first floor for the x-ray testing  You will be contacted regarding the referral for: Cardiology  Please make an Appointment to return in 6 months, or sooner if needed

## 2023-11-29 NOTE — Progress Notes (Unsigned)
 Patient ID: Billy Morris, male   DOB: August 04, 1970, 53 y.o.   MRN: [id number]        Chief Complaint: follow up anxiety/ work stress, chest pain, low vit d, OSA, hyperglycemia       HPI:  Billy Morris is a 53 y.o. male here with hx of CAD, states he believes he had panic attack last tues, then thur again after increased stress at work supervising coworkers.  Also with intermittent mid sternal chest pains short lived sharp and dull, without radiation, palps/ n/v, diaphoresis or dizziness.  Did start recent pushups regimen most days at home.  His cardiologist recently retired, asks for new referral in [place].  Also sees pulm and plans to call for f/u soon, has been struggling with new start x 6 wks CPAP mask.         Wt Readings from Last 3 Encounters:  11/29/23 164 lb (74.4 kg)  11/03/23 167 lb 9.6 oz (76 kg)  09/09/23 163 lb 9.6 oz (74.2 kg)   BP Readings from Last 3 Encounters:  11/29/23 124/82  11/03/23 115/66  09/09/23 110/66         Past Medical History:  Diagnosis Date   Allergic rhinitis    Allergy     Anal fissure    Anxiety    Asthma    Atypical chest pain    CAD (coronary artery disease) 09/01/2019   a.) cCTA 09/01/2022: Ca2+ 50 (84th %'ile for age/sex/race match control); isolated to pLAD territory; b.) cCTA 02/27/2022: Ca2+ 67.1 (83rd (84th %'ile for age/sex/race match control); isolated to oLAD territory   Carpal tunnel syndrome    Complication of anesthesia    a.) delayed emergence; I dont need much anesthesia   COPD (chronic obstructive pulmonary disease) (HCC)    Coronary atherosclerosis    Diverticulosis    GERD (gastroesophageal reflux disease)    H. pylori infection    Hearing loss, right    Hiatal hernia    Hyperlipidemia    LBP (low back pain)    TMJ (dislocation of temporomandibular joint)    Umbilical hernia 10/2022   Vitamin B 12 deficiency    Vitamin D  deficiency    Past Surgical History:  Procedure Laterality Date   COLONOSCOPY     HERNIA  REPAIR     laser retinopexy Right 2015   STAPEDECTOMY Right    UMBILICAL HERNIA REPAIR N/A 11/06/2022   Procedure: HERNIA REPAIR UMBILICAL ADULT  possibly w/ mesh;  Surgeon: Tye Millet, DO;  Location: [facility] ORS;  Service: General;  Laterality: N/A;   UPPER GI ENDOSCOPY     VASECTOMY      reports that he has never smoked. He has never used smokeless tobacco. He reports that he does not drink alcohol and does not use drugs. family history includes Allergies in an other family member; Anxiety disorder in his mother; Asthma in his father and son; Atrial fibrillation in his father; COPD in his maternal grandfather; Colon polyps in his maternal grandfather; Diabetes in his maternal aunt, maternal grandmother, and paternal aunt; Heart attack in his maternal grandmother; Heart failure in an other family member; Hyperlipidemia in his father and mother; Irritable bowel syndrome in his maternal aunt; Other in his mother; Ovarian cancer in his paternal grandmother; Prostate cancer in his maternal grandfather; Stroke in his maternal aunt. Allergies  Allergen Reactions   Bovine (Beef) Protein Hives    All red meat causes hives   Ciprofloxacin Other (See Comments) and Diarrhea  Bloody stool Bloody stool Stools have bloody mucous   Penicillins Rash   Sulfa Antibiotics Rash   Tiotropium Bromide Monohydrate Other (See Comments)    heart racing and paresthesias   Corticosteroids     CSP- Central serous retinopathy    Nabumetone     weakness   Sulfonamide Derivatives    Current Outpatient Medications on File Prior to Visit  Medication Sig Dispense Refill   albuterol  (PROAIR  HFA) 108 (90 Base) MCG/ACT inhaler Inhale 2 puffs into the lungs every 4 (four) hours as needed for wheezing or shortness of breath. 18 g 2   aspirin  EC 81 MG tablet Take 1 tablet (81 mg total) by mouth daily. Swallow whole. 90 tablet 3   cetirizine (ZYRTEC) 5 MG tablet Take 5 mg by mouth daily.     Cholecalciferol (VITAMIN D3 PO)  Take 1 tablet by mouth daily.     EPINEPHrine  (AUVI-Q ) 0.3 mg/0.3 mL IJ SOAJ injection Inject 0.3 mg into the muscle as needed for anaphylaxis. 1 each 1   EPINEPHrine  0.3 mg/0.3 mL IJ SOAJ injection Inject 0.3 mg into the muscle as needed for anaphylaxis. 2 each 2   fluticasone  (FLONASE ) 50 MCG/ACT nasal spray 1 spray per nostril 1-2 times daily as needed. 16 g 5   ketoconazole  (NIZORAL ) 2 % cream Apply 1 Application topically daily. 15 g 0   lansoprazole  (PREVACID ) 30 MG capsule Take 30 mg by mouth daily.     predniSONE  (DELTASONE ) 10 MG tablet 3 tabs by mouth per day for 3 days,2tabs per day for 3 days,1tab per day for 3 days 18 tablet 0   QVAR  REDIHALER 80 MCG/ACT inhaler 1 puff 1-2 times daily depending on disease activity. 31.8 g 3   rosuvastatin  (CRESTOR ) 20 MG tablet TAKE 1 TABLET BY MOUTH EVERY DAY 90 tablet 3   vitamin B-12 (CYANOCOBALAMIN ) 1000 MCG tablet Take 1 tablet (1,000 mcg total) by mouth daily. 90 tablet 3   No current facility-administered medications on file prior to visit.        ROS:  All others reviewed and negative.  Objective        PE:  BP 124/82 (BP Location: Right Arm, Patient Position: Sitting, Cuff Size: Normal)   Pulse 65   Temp 98.5 F (36.9 C) (Oral)   Ht 5' 4 (1.626 m)   Wt 164 lb (74.4 kg)   SpO2 99%   BMI 28.15 kg/m                 Constitutional: Pt appears in NAD               HENT: Head: NCAT.                Right Ear: External ear normal.                 Left Ear: External ear normal.                Eyes: . Pupils are equal, round, and reactive to light. Conjunctivae and EOM are normal               Nose: without d/c or deformity               Neck: Neck supple. Gross normal ROM               Cardiovascular: Normal rate and regular rhythm.  Pulmonary/Chest: Effort normal and breath sounds without rales or wheezing.                Abd:  Soft, NT, ND, + BS, no organomegaly               Neurological: Pt is alert. At baseline  orientation, motor grossly intact               Skin: Skin is warm. No rashes, no other new lesions, LE edema - none               Psychiatric: Pt behavior is normal without agitation , 2+ nervous  Micro: none  Cardiac tracings I have personally interpreted today:  ECG - NSR 60  Pertinent Radiological findings (summarize): none   Lab Results  Component Value Date   WBC 5.2 06/28/2023   HGB 13.9 06/28/2023   HCT 40.1 06/28/2023   PLT 196.0 06/28/2023   GLUCOSE 82 06/28/2023   CHOL 125 06/28/2023   TRIG 54.0 06/28/2023   HDL 51.70 06/28/2023   LDLCALC 62 06/28/2023   ALT 18 06/28/2023   AST 18 06/28/2023   NA 139 06/28/2023   K 4.1 06/28/2023   CL 103 06/28/2023   CREATININE 0.76 06/28/2023   BUN 19 06/28/2023   CO2 28 06/28/2023   TSH 1.64 06/28/2023   PSA 0.33 06/28/2023   HGBA1C 5.4 06/28/2023   MICROALBUR <0.7 06/28/2023   Assessment/Plan:  ABDON PETROSKY is a 53 y.o. White or Caucasian [1] male with  has a past medical history of Allergic rhinitis, Allergy , Anal fissure, Anxiety, Asthma, Atypical chest pain, CAD (coronary artery disease) (09/01/2019), Carpal tunnel syndrome, Complication of anesthesia, COPD (chronic obstructive pulmonary disease) (HCC), Coronary atherosclerosis, Diverticulosis, GERD (gastroesophageal reflux disease), H. pylori infection, Hearing loss, right, Hiatal hernia, Hyperlipidemia, LBP (low back pain), TMJ (dislocation of temporomandibular joint), Umbilical hernia (10/2022), Vitamin B 12 deficiency, and Vitamin D  deficiency.  Vitamin D  deficiency Last vitamin D  Lab Results  Component Value Date   VD25OH 27.91 (L) 06/28/2023   Low, to start oral replacement   Hyperglycemia Lab Results  Component Value Date   HGBA1C 5.4 06/28/2023   Stable, pt to continue current medical treatment  - diet, wt control   Chest pain, atypical C/w likely msk pain, ecg reviewed, Cor Calc score 50, pt for referral to cardiology [place], cont same tx, also for  CXR  B12 deficiency Lab Results  Component Value Date   VITAMINB12 424 06/28/2023   Stable, cont oral replacement - b12 1000 mcg qd   Anxiety state Pt had weaned himself off over a year ago due to mild apathy but willing to restart to avoid panic - to restart celexa  20 qd  Followup: Return in about 6 months (around 05/29/2024).  Lynwood Rush, MD 12/01/2023 8:21 PM [facility] - [facility]

## 2023-12-01 ENCOUNTER — Ambulatory Visit: Payer: Self-pay | Admitting: Internal Medicine

## 2023-12-01 ENCOUNTER — Encounter: Payer: Self-pay | Admitting: Internal Medicine

## 2023-12-01 NOTE — Assessment & Plan Note (Signed)
 Lab Results  Component Value Date   VITAMINB12 424 06/28/2023   Stable, cont oral replacement - b12 1000 mcg qd

## 2023-12-01 NOTE — Assessment & Plan Note (Addendum)
 C/w likely msk pain, ecg reviewed, Cor Calc score 50, pt for referral to cardiology [place], cont same tx, also for CXR

## 2023-12-01 NOTE — Assessment & Plan Note (Signed)
 Lab Results  Component Value Date   HGBA1C 5.4 06/28/2023   Stable, pt to continue current medical treatment  - diet, wt control

## 2023-12-01 NOTE — Assessment & Plan Note (Signed)
 Last vitamin D  Lab Results  Component Value Date   VD25OH 27.91 (L) 06/28/2023   Low, to start oral replacement

## 2023-12-01 NOTE — Assessment & Plan Note (Signed)
 Pt had weaned himself off over a year ago due to mild apathy but willing to restart to avoid panic - to restart celexa  20 qd

## 2023-12-03 ENCOUNTER — Ambulatory Visit: Admitting: Internal Medicine

## 2023-12-24 ENCOUNTER — Ambulatory Visit: Admitting: Cardiovascular Disease

## 2024-01-21 ENCOUNTER — Ambulatory Visit: Admitting: Cardiovascular Disease

## 2024-02-24 ENCOUNTER — Ambulatory Visit: Admitting: Internal Medicine

## 2024-03-03 ENCOUNTER — Other Ambulatory Visit: Payer: Self-pay | Admitting: Vascular Surgery

## 2024-03-03 DIAGNOSIS — I83811 Varicose veins of right lower extremities with pain: Secondary | ICD-10-CM

## 2024-03-21 ENCOUNTER — Ambulatory Visit: Payer: Managed Care, Other (non HMO) | Admitting: Allergy and Immunology

## 2024-04-21 ENCOUNTER — Encounter

## 2024-04-21 ENCOUNTER — Ambulatory Visit (HOSPITAL_COMMUNITY)

## 2024-06-30 ENCOUNTER — Encounter: Admitting: Internal Medicine
